# Patient Record
Sex: Female | Born: 1985
Health system: Southern US, Community
[De-identification: ages and names within clinical notes are randomized; demographics above are authoritative.]

## PROBLEM LIST (undated history)

## (undated) ENCOUNTER — Inpatient Hospital Stay (HOSPITAL_COMMUNITY): Payer: Self-pay

## (undated) ENCOUNTER — Inpatient Hospital Stay (HOSPITAL_COMMUNITY): Payer: BLUE CROSS/BLUE SHIELD

## (undated) DIAGNOSIS — O24419 Gestational diabetes mellitus in pregnancy, unspecified control: Secondary | ICD-10-CM

## (undated) DIAGNOSIS — Z5189 Encounter for other specified aftercare: Secondary | ICD-10-CM

## (undated) DIAGNOSIS — O00109 Unspecified tubal pregnancy without intrauterine pregnancy: Secondary | ICD-10-CM

## (undated) DIAGNOSIS — N189 Chronic kidney disease, unspecified: Secondary | ICD-10-CM

## (undated) DIAGNOSIS — Z8759 Personal history of other complications of pregnancy, childbirth and the puerperium: Secondary | ICD-10-CM

## (undated) DIAGNOSIS — N83209 Unspecified ovarian cyst, unspecified side: Secondary | ICD-10-CM

## (undated) HISTORY — PX: DILATION AND CURETTAGE OF UTERUS: SHX78

---
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
## 2004-01-09 ENCOUNTER — Emergency Department (HOSPITAL_COMMUNITY): Admission: EM | Admit: 2004-01-09 | Discharge: 2004-01-09 | Payer: Self-pay | Admitting: Family Medicine

## 2007-01-09 ENCOUNTER — Emergency Department (HOSPITAL_COMMUNITY): Admission: EM | Admit: 2007-01-09 | Discharge: 2007-01-10 | Payer: Self-pay | Admitting: Emergency Medicine

## 2007-08-19 ENCOUNTER — Emergency Department (HOSPITAL_COMMUNITY): Admission: EM | Admit: 2007-08-19 | Discharge: 2007-08-19 | Payer: Self-pay | Admitting: Emergency Medicine

## 2007-12-07 ENCOUNTER — Encounter: Admission: RE | Admit: 2007-12-07 | Discharge: 2007-12-07 | Payer: Self-pay | Admitting: Family Medicine

## 2008-02-22 ENCOUNTER — Emergency Department (HOSPITAL_COMMUNITY): Admission: EM | Admit: 2008-02-22 | Discharge: 2008-02-22 | Payer: Self-pay | Admitting: Family Medicine

## 2008-03-05 ENCOUNTER — Emergency Department (HOSPITAL_COMMUNITY): Admission: EM | Admit: 2008-03-05 | Discharge: 2008-03-05 | Payer: Self-pay | Admitting: Family Medicine

## 2008-06-14 ENCOUNTER — Emergency Department (HOSPITAL_COMMUNITY): Admission: EM | Admit: 2008-06-14 | Discharge: 2008-06-14 | Payer: Self-pay | Admitting: Emergency Medicine

## 2008-06-19 ENCOUNTER — Encounter: Admission: RE | Admit: 2008-06-19 | Discharge: 2008-06-19 | Payer: Self-pay | Admitting: Chiropractic Medicine

## 2009-03-12 ENCOUNTER — Emergency Department (HOSPITAL_COMMUNITY): Admission: EM | Admit: 2009-03-12 | Discharge: 2009-03-13 | Payer: Self-pay | Admitting: Emergency Medicine

## 2009-07-06 DIAGNOSIS — O24419 Gestational diabetes mellitus in pregnancy, unspecified control: Secondary | ICD-10-CM

## 2009-07-06 HISTORY — DX: Gestational diabetes mellitus in pregnancy, unspecified control: O24.419

## 2009-10-01 ENCOUNTER — Inpatient Hospital Stay (HOSPITAL_COMMUNITY): Admission: AD | Admit: 2009-10-01 | Discharge: 2009-10-01 | Payer: Self-pay | Admitting: Family Medicine

## 2009-10-03 ENCOUNTER — Inpatient Hospital Stay (HOSPITAL_COMMUNITY): Admission: AD | Admit: 2009-10-03 | Discharge: 2009-10-03 | Payer: Self-pay | Admitting: Family Medicine

## 2009-10-10 ENCOUNTER — Ambulatory Visit (HOSPITAL_COMMUNITY): Admission: RE | Admit: 2009-10-10 | Discharge: 2009-10-10 | Payer: Self-pay | Admitting: Family Medicine

## 2009-10-18 ENCOUNTER — Ambulatory Visit: Payer: Self-pay | Admitting: Physician Assistant

## 2009-10-18 ENCOUNTER — Inpatient Hospital Stay (HOSPITAL_COMMUNITY): Admission: AD | Admit: 2009-10-18 | Discharge: 2009-10-18 | Payer: Self-pay | Admitting: Obstetrics

## 2009-12-04 ENCOUNTER — Ambulatory Visit: Admission: RE | Admit: 2009-12-04 | Discharge: 2009-12-04 | Payer: Self-pay | Admitting: Gynecologic Oncology

## 2009-12-04 ENCOUNTER — Ambulatory Visit (HOSPITAL_COMMUNITY): Admission: RE | Admit: 2009-12-04 | Discharge: 2009-12-04 | Payer: Self-pay | Admitting: Gynecologic Oncology

## 2009-12-10 ENCOUNTER — Ambulatory Visit: Payer: Self-pay | Admitting: Obstetrics and Gynecology

## 2009-12-10 ENCOUNTER — Inpatient Hospital Stay (HOSPITAL_COMMUNITY): Admission: AD | Admit: 2009-12-10 | Discharge: 2009-12-10 | Payer: Self-pay | Admitting: Obstetrics

## 2010-01-08 ENCOUNTER — Ambulatory Visit (HOSPITAL_COMMUNITY): Admission: RE | Admit: 2010-01-08 | Discharge: 2010-01-08 | Payer: Self-pay | Admitting: Obstetrics

## 2010-02-07 ENCOUNTER — Inpatient Hospital Stay (HOSPITAL_COMMUNITY): Admission: AD | Admit: 2010-02-07 | Discharge: 2010-02-07 | Payer: Self-pay | Admitting: Obstetrics & Gynecology

## 2010-02-07 ENCOUNTER — Ambulatory Visit: Payer: Self-pay | Admitting: Obstetrics and Gynecology

## 2010-02-07 DIAGNOSIS — N390 Urinary tract infection, site not specified: Secondary | ICD-10-CM

## 2010-02-07 DIAGNOSIS — O2390 Unspecified genitourinary tract infection in pregnancy, unspecified trimester: Secondary | ICD-10-CM

## 2010-02-07 DIAGNOSIS — R109 Unspecified abdominal pain: Secondary | ICD-10-CM

## 2010-02-26 ENCOUNTER — Ambulatory Visit (HOSPITAL_COMMUNITY): Admission: RE | Admit: 2010-02-26 | Discharge: 2010-02-26 | Payer: Self-pay | Admitting: Obstetrics & Gynecology

## 2010-05-11 ENCOUNTER — Inpatient Hospital Stay (HOSPITAL_COMMUNITY): Admission: AD | Admit: 2010-05-11 | Discharge: 2010-05-11 | Payer: Self-pay | Admitting: Obstetrics & Gynecology

## 2010-05-22 ENCOUNTER — Observation Stay (HOSPITAL_COMMUNITY)
Admission: AD | Admit: 2010-05-22 | Discharge: 2010-05-23 | Payer: Self-pay | Source: Home / Self Care | Admitting: Obstetrics & Gynecology

## 2010-05-23 ENCOUNTER — Inpatient Hospital Stay (HOSPITAL_COMMUNITY)
Admission: AD | Admit: 2010-05-23 | Discharge: 2010-05-24 | Payer: Self-pay | Source: Home / Self Care | Admitting: Obstetrics & Gynecology

## 2010-05-25 ENCOUNTER — Inpatient Hospital Stay (HOSPITAL_COMMUNITY)
Admission: AD | Admit: 2010-05-25 | Discharge: 2010-05-28 | Payer: Self-pay | Source: Home / Self Care | Admitting: Obstetrics & Gynecology

## 2010-05-26 ENCOUNTER — Encounter: Payer: Self-pay | Admitting: Obstetrics & Gynecology

## 2010-07-27 ENCOUNTER — Encounter: Payer: Self-pay | Admitting: Family Medicine

## 2010-09-16 LAB — CBC
Hemoglobin: 12 g/dL (ref 12.0–15.0)
MCH: 28.7 pg (ref 26.0–34.0)
MCH: 28.8 pg (ref 26.0–34.0)
MCHC: 32.6 g/dL (ref 30.0–36.0)
MCHC: 32.8 g/dL (ref 30.0–36.0)
MCHC: 32.8 g/dL (ref 30.0–36.0)
MCV: 87.3 fL (ref 78.0–100.0)
RBC: 3.62 MIL/uL — ABNORMAL LOW (ref 3.87–5.11)
RBC: 4.22 MIL/uL (ref 3.87–5.11)
RDW: 14.9 % (ref 11.5–15.5)
RDW: 15 % (ref 11.5–15.5)
WBC: 5.3 10*3/uL (ref 4.0–10.5)
WBC: 6.5 10*3/uL (ref 4.0–10.5)

## 2010-09-16 LAB — GLUCOSE, RANDOM: Glucose, Bld: 79 mg/dL (ref 70–99)

## 2010-09-16 LAB — RPR: RPR Ser Ql: NONREACTIVE

## 2010-09-19 LAB — URINE MICROSCOPIC-ADD ON

## 2010-09-19 LAB — URINE CULTURE

## 2010-09-19 LAB — URINALYSIS, ROUTINE W REFLEX MICROSCOPIC
Glucose, UA: NEGATIVE mg/dL
Ketones, ur: NEGATIVE mg/dL
pH: 7.5 (ref 5.0–8.0)

## 2010-09-22 LAB — URINALYSIS, ROUTINE W REFLEX MICROSCOPIC
Bilirubin Urine: NEGATIVE
Hgb urine dipstick: NEGATIVE
Ketones, ur: NEGATIVE mg/dL
Protein, ur: NEGATIVE mg/dL
Specific Gravity, Urine: 1.025 (ref 1.005–1.030)
Urobilinogen, UA: 0.2 mg/dL (ref 0.0–1.0)

## 2010-09-28 LAB — WET PREP, GENITAL: Clue Cells Wet Prep HPF POC: NONE SEEN

## 2010-09-28 LAB — ABO/RH: ABO/RH(D): O POS

## 2010-09-28 LAB — CBC
Hemoglobin: 11.6 g/dL — ABNORMAL LOW (ref 12.0–15.0)
RDW: 14.1 % (ref 11.5–15.5)

## 2010-09-28 LAB — GC/CHLAMYDIA PROBE AMP, GENITAL: Chlamydia, DNA Probe: NEGATIVE

## 2010-09-28 LAB — HCG, QUANTITATIVE, PREGNANCY: hCG, Beta Chain, Quant, S: 545 m[IU]/mL — ABNORMAL HIGH (ref <5)

## 2010-10-10 LAB — DIFFERENTIAL
Basophils Absolute: 0 10*3/uL (ref 0.0–0.1)
Lymphocytes Relative: 52 % — ABNORMAL HIGH (ref 12–46)
Monocytes Absolute: 0.5 10*3/uL (ref 0.1–1.0)
Neutro Abs: 3.7 10*3/uL (ref 1.7–7.7)

## 2010-10-10 LAB — CBC
Hemoglobin: 12 g/dL (ref 12.0–15.0)
Platelets: 266 10*3/uL (ref 150–400)
RDW: 13.5 % (ref 11.5–15.5)

## 2010-10-10 LAB — POCT I-STAT, CHEM 8
BUN: 9 mg/dL (ref 6–23)
Chloride: 104 mEq/L (ref 96–112)
Potassium: 3.2 mEq/L — ABNORMAL LOW (ref 3.5–5.1)
Sodium: 139 mEq/L (ref 135–145)

## 2011-03-27 LAB — CARBOXYHEMOGLOBIN
Carboxyhemoglobin: 0.5
Methemoglobin: 0.8
O2 Saturation: 100
Total hemoglobin: 11.4 — ABNORMAL LOW

## 2011-04-21 LAB — DIFFERENTIAL
Basophils Absolute: 0
Eosinophils Relative: 0
Lymphocytes Relative: 25
Monocytes Absolute: 0.1 — ABNORMAL LOW
Monocytes Relative: 1 — ABNORMAL LOW

## 2011-04-21 LAB — COMPREHENSIVE METABOLIC PANEL
AST: 15
Albumin: 4
Alkaline Phosphatase: 44
Chloride: 106
GFR calc Af Amer: >60
Potassium: 3.4 — ABNORMAL LOW
Total Bilirubin: 0.7
Total Protein: 7.3

## 2011-04-21 LAB — CBC
Platelets: 316
WBC: 6

## 2011-12-22 ENCOUNTER — Other Ambulatory Visit: Payer: Self-pay | Admitting: Obstetrics & Gynecology

## 2011-12-22 DIAGNOSIS — N63 Unspecified lump in unspecified breast: Secondary | ICD-10-CM

## 2011-12-22 DIAGNOSIS — N644 Mastodynia: Secondary | ICD-10-CM

## 2011-12-24 ENCOUNTER — Ambulatory Visit
Admission: RE | Admit: 2011-12-24 | Discharge: 2011-12-24 | Disposition: A | Discharge status: Home / Self Care | Payer: No Typology Code available for payment source | Source: Ambulatory Visit | Attending: Obstetrics & Gynecology | Admitting: Obstetrics & Gynecology

## 2011-12-24 DIAGNOSIS — N63 Unspecified lump in unspecified breast: Secondary | ICD-10-CM

## 2011-12-24 DIAGNOSIS — N644 Mastodynia: Secondary | ICD-10-CM

## 2013-02-01 ENCOUNTER — Encounter (HOSPITAL_COMMUNITY): Payer: Self-pay

## 2013-02-01 ENCOUNTER — Inpatient Hospital Stay (HOSPITAL_COMMUNITY): Payer: BC Managed Care – PPO

## 2013-02-01 ENCOUNTER — Inpatient Hospital Stay (HOSPITAL_COMMUNITY)
Admission: AD | Admit: 2013-02-01 | Discharge: 2013-02-02 | Disposition: A | Discharge status: Home / Self Care | Payer: BC Managed Care – PPO | Source: Ambulatory Visit | Attending: Obstetrics & Gynecology | Admitting: Obstetrics & Gynecology

## 2013-02-01 DIAGNOSIS — R109 Unspecified abdominal pain: Secondary | ICD-10-CM | POA: Insufficient documentation

## 2013-02-01 DIAGNOSIS — Z3201 Encounter for pregnancy test, result positive: Secondary | ICD-10-CM

## 2013-02-01 DIAGNOSIS — O26899 Other specified pregnancy related conditions, unspecified trimester: Secondary | ICD-10-CM

## 2013-02-01 DIAGNOSIS — O99891 Other specified diseases and conditions complicating pregnancy: Secondary | ICD-10-CM | POA: Insufficient documentation

## 2013-02-01 HISTORY — DX: Unspecified ovarian cyst, unspecified side: N83.209

## 2013-02-01 HISTORY — DX: Gestational diabetes mellitus in pregnancy, unspecified control: O24.419

## 2013-02-01 LAB — CBC
HCT: 33.8 % — ABNORMAL LOW (ref 36.0–46.0)
Hemoglobin: 11.2 g/dL — ABNORMAL LOW (ref 12.0–15.0)
MCV: 84.5 fL (ref 78.0–100.0)
RBC: 4 MIL/uL (ref 3.87–5.11)
RDW: 13.3 % (ref 11.5–15.5)
WBC: 6.2 10*3/uL (ref 4.0–10.5)

## 2013-02-01 LAB — POCT PREGNANCY, URINE: Preg Test, Ur: POSITIVE — AB

## 2013-02-01 LAB — URINALYSIS, ROUTINE W REFLEX MICROSCOPIC
Bilirubin Urine: NEGATIVE
Glucose, UA: NEGATIVE mg/dL
Hgb urine dipstick: NEGATIVE
Ketones, ur: NEGATIVE mg/dL
Nitrite: NEGATIVE
Protein, ur: NEGATIVE mg/dL
Specific Gravity, Urine: >1.03 — ABNORMAL HIGH (ref 1.005–1.030)
Urobilinogen, UA: 0.2 mg/dL (ref 0.0–1.0)
pH: 6 (ref 5.0–8.0)

## 2013-02-01 LAB — WET PREP, GENITAL
Clue Cells Wet Prep HPF POC: NONE SEEN
Trich, Wet Prep: NONE SEEN
Yeast Wet Prep HPF POC: NONE SEEN

## 2013-02-01 LAB — HCG, QUANTITATIVE, PREGNANCY: hCG, Beta Chain, Quant, S: 2917 m[IU]/mL — ABNORMAL HIGH (ref <5)

## 2013-02-01 LAB — URINE MICROSCOPIC-ADD ON

## 2013-02-01 MED ORDER — GI COCKTAIL ~~LOC~~
30.0000 mL | Freq: Once | ORAL | Status: AC
  Administered 2013-02-01: 30 mL via ORAL
  Filled 2013-02-01: qty 30

## 2013-02-01 NOTE — MAU Note (Signed)
Pt states she noticed pain a week ago, that pt described as menstrual cramps. Pt took a pregnancy test after menses did not start

## 2013-02-01 NOTE — MAU Provider Note (Signed)
History     CSN: [id number]  Arrival date and time: 02/01/13 2021   None     Chief Complaint  Patient presents with  . Abdominal Cramping   HPI This is a 27 y.o. female at [redacted]w[redacted]d who presents with c/o (to RN) of cramping in lower abdomen since getting a positive pregnancy test at home. But when I ask her myself where the cramping is, she tells me that the cramping is umbilical and upper abdomen. None in lower abdomen. No bleeding.   RN Note: Patient is in with cramping, had a positive home pregnancy test. She denies vaginal bleeding. lmp 6/20      OB History   Grav Para Term Preterm Abortions TAB SAB Ect Mult Living   3 1 1  1  1   1       Past Medical History  Diagnosis Date  . Gestational diabetes   . Ovarian cyst     Past Surgical History  Procedure Laterality Date  . Cesarean section      No family history on file.  History  Substance Use Topics  . Smoking status: Never Smoker   . Smokeless tobacco: Not on file  . Alcohol Use: No    Allergies:  Allergies  Allergen Reactions  . Other     Pt reports allergy to any type of melon fruit.  . Vicodin (Hydrocodone-Acetaminophen) Other (See Comments)    Pt reports seizures    Prescriptions prior to admission  Medication Sig Dispense Refill  . ibuprofen (ADVIL,MOTRIN) 200 MG tablet Take 400 mg by mouth every 6 (six) hours as needed for pain.      . Multiple Vitamin (MULTIVITAMIN WITH MINERALS) TABS Take 1 tablet by mouth daily.        Review of Systems  Constitutional: Negative for fever, chills and malaise/fatigue.  Gastrointestinal: Positive for abdominal pain (cramping upper abdomen). Negative for nausea, vomiting, diarrhea and constipation.  Genitourinary: Negative for dysuria.  Neurological: Negative for dizziness.   Physical Exam   Height 5\' 4"  (1.626 m), weight 99.451 kg (219 lb 4 oz), last menstrual period 12/23/2012.  Physical Exam  Constitutional: She is oriented to person, place, and time.  She appears well-developed and well-nourished. No distress.  HENT:  Head: Normocephalic.  Cardiovascular: Normal rate.   Respiratory: Effort normal.  GI: Soft. She exhibits no distension. There is no tenderness. There is no rebound and no guarding.  Genitourinary: Vagina normal and uterus normal. No vaginal discharge found.  Musculoskeletal: Normal range of motion.  Neurological: She is alert and oriented to person, place, and time.  Skin: Skin is warm and dry.  Psychiatric: She has a normal mood and affect.    MAU Course  Procedures  MDM Results for orders placed during the hospital encounter of 02/01/13 (from the past 24 hour(s))  URINALYSIS, ROUTINE W REFLEX MICROSCOPIC     Status: Abnormal   Collection Time    02/01/13  8:30 PM      Result Value Range   Color, Urine YELLOW  YELLOW   APPearance CLOUDY (*) CLEAR   Specific Gravity, Urine >1.030 (*) 1.005 - 1.030   pH 6.0  5.0 - 8.0   Glucose, UA NEGATIVE  NEGATIVE mg/dL   Hgb urine dipstick NEGATIVE  NEGATIVE   Bilirubin Urine NEGATIVE  NEGATIVE   Ketones, ur NEGATIVE  NEGATIVE mg/dL   Protein, ur NEGATIVE  NEGATIVE mg/dL   Urobilinogen, UA 0.2  0.0 - 1.0 mg/dL   Nitrite  NEGATIVE  NEGATIVE   Leukocytes, UA MODERATE (*) NEGATIVE  URINE MICROSCOPIC-ADD ON     Status: Abnormal   Collection Time    02/01/13  8:30 PM      Result Value Range   Squamous Epithelial / LPF FEW (*) RARE   WBC, UA 7-10  <3 WBC/hpf   RBC / HPF 0-2  <3 RBC/hpf   Bacteria, UA FEW (*) RARE   Urine-Other MUCOUS PRESENT    POCT PREGNANCY, URINE     Status: Abnormal   Collection Time    02/01/13  8:39 PM      Result Value Range   Preg Test, Ur POSITIVE (*) NEGATIVE  HCG, QUANTITATIVE, PREGNANCY     Status: Abnormal   Collection Time    02/01/13  9:15 PM      Result Value Range   hCG, Beta Chain, Quant, S 2917 (*) <5 mIU/mL  CBC     Status: Abnormal   Collection Time    02/01/13  9:15 PM      Result Value Range   WBC 6.2  4.0 - 10.5 K/uL   RBC  4.00  3.87 - 5.11 MIL/uL   Hemoglobin 11.2 (*) 12.0 - 15.0 g/dL   HCT 16.1 (*) 09.6 - 04.5 %   MCV 84.5  78.0 - 100.0 fL   MCH 28.0  26.0 - 34.0 pg   MCHC 33.1  30.0 - 36.0 g/dL   RDW 40.9  81.1 - 91.4 %   Platelets 350  150 - 400 K/uL  WET PREP, GENITAL     Status: Abnormal   Collection Time    02/01/13  9:33 PM      Result Value Range   Yeast Wet Prep HPF POC NONE SEEN  NONE SEEN   Trich, Wet Prep NONE SEEN  NONE SEEN   Clue Cells Wet Prep HPF POC NONE SEEN  NONE SEEN   WBC, Wet Prep HPF POC MODERATE (*) NONE SEEN    US Ob Transvaginal  02/01/2013   *RADIOLOGY REPORT*  Clinical Data: Quantitative beta HCG of 2900.  Cramping.  Irregular cycles.  OBSTETRIC <14 WK [place] AND TRANSVAGINAL OB US  Technique:  Both transabdominal and transvaginal ultrasound examinations were performed for complete evaluation of the gestation as well as the maternal uterus, adnexal regions, and pelvic cul-de-sac.  Transvaginal technique was performed to assess early pregnancy.  Comparison:  None.  Intrauterine gestational sac:  None visualized. Yolk sac: None visualized. Embryo: None visualized. Cardiac Activity: None visualized. Heart Rate: Not applicable. bpm  Maternal uterus/adnexae: Small amount of free fluid.  The ovaries have a normal physiologic appearance.  IMPRESSION: There is no intrauterine pregnancy identified.  The differential considerations include intrauterine gestation too early to be visualized, spontaneous abortion or ectopic.  Consider follow-up ultrasound in 14 days and serial quantitative beta HCGs.   Original Report Authenticated By: Andreas Newport, M.D.    Assessment and Plan  A:  Pregnancy at [redacted]w[redacted]d       Location and status of pregnancy undetermined      Upper abdominal discomfort relieved by GI cocktail, suspect GERD  P:  Discharge home       Ectopic precautions       Repeat quant in 48 hrs and [place] in 1 week       Advised to use Zantac/tums at home  [facility] 02/01/2013, 9:04 PM

## 2013-02-01 NOTE — MAU Note (Signed)
Patient is in with cramping, had a positive home pregnancy test. She denies vaginal bleeding. lmp 6/20

## 2013-02-02 ENCOUNTER — Other Ambulatory Visit (HOSPITAL_COMMUNITY): Payer: Self-pay | Admitting: Advanced Practice Midwife

## 2013-02-02 DIAGNOSIS — Z3201 Encounter for pregnancy test, result positive: Secondary | ICD-10-CM

## 2013-02-02 LAB — GC/CHLAMYDIA PROBE AMP: GC Probe RNA: NEGATIVE

## 2013-02-03 HISTORY — PX: SALPINGECTOMY: SHX328

## 2013-02-03 LAB — URINE CULTURE

## 2013-02-04 ENCOUNTER — Inpatient Hospital Stay (HOSPITAL_COMMUNITY)
Admission: AD | Admit: 2013-02-04 | Discharge: 2013-02-04 | Disposition: A | Discharge status: Home / Self Care | Payer: BC Managed Care – PPO | Source: Ambulatory Visit | Attending: Obstetrics & Gynecology | Admitting: Obstetrics & Gynecology

## 2013-02-04 DIAGNOSIS — R109 Unspecified abdominal pain: Secondary | ICD-10-CM

## 2013-02-04 DIAGNOSIS — O26899 Other specified pregnancy related conditions, unspecified trimester: Secondary | ICD-10-CM

## 2013-02-04 DIAGNOSIS — O99891 Other specified diseases and conditions complicating pregnancy: Secondary | ICD-10-CM | POA: Insufficient documentation

## 2013-02-04 LAB — HCG, QUANTITATIVE, PREGNANCY: hCG, Beta Chain, Quant, S: 6387 m[IU]/mL — ABNORMAL HIGH (ref <5)

## 2013-02-04 NOTE — MAU Provider Note (Signed)
  History     CSN: [id number]  Arrival date and time: 02/04/13 7846   None     Chief Complaint  Patient presents with  . Follow-up   HPI This is a 27 y.o. female at [redacted]w[redacted]d who presents for followup HCG. Seen for abdominal pain (mostly upper) 2 days ago. States has NO pain today.   OB History   Grav Para Term Preterm Abortions TAB SAB Ect Mult Living   3 1 1  1  1   1       Past Medical History  Diagnosis Date  . Gestational diabetes   . Ovarian cyst     Past Surgical History  Procedure Laterality Date  . Cesarean section      No family history on file.  History  Substance Use Topics  . Smoking status: Never Smoker   . Smokeless tobacco: Not on file  . Alcohol Use: No    Allergies:  Allergies  Allergen Reactions  . Other     Pt reports allergy to any type of melon fruit.  . Vicodin (Hydrocodone-Acetaminophen) Other (See Comments)    Pt reports seizures    Prescriptions prior to admission  Medication Sig Dispense Refill  . Multiple Vitamin (MULTIVITAMIN WITH MINERALS) TABS Take 1 tablet by mouth daily.        Review of Systems  Constitutional: Negative for fever and malaise/fatigue.  Gastrointestinal: Negative for abdominal pain.   Physical Exam   Blood pressure 132/75, pulse 78, temperature 98.3 F (36.8 C), temperature source Oral, resp. rate 18, last menstrual period 12/23/2012.  Physical Exam  Constitutional: She is oriented to person, place, and time. She appears well-developed and well-nourished.  Cardiovascular: Normal rate.   Respiratory: Effort normal.  Musculoskeletal: Normal range of motion.  Neurological: She is alert and oriented to person, place, and time.  Skin: Skin is warm and dry.  Psychiatric: She has a normal mood and affect.    MAU Course  Procedures  MDM Results for orders placed during the hospital encounter of 02/04/13 (from the past 24 hour(s))  HCG, QUANTITATIVE, PREGNANCY     Status: Abnormal   Collection Time   02/04/13  8:24 AM      Result Value Range   hCG, Beta Chain, Quant, S 6387 (*) <5 mIU/mL   This represents doubling from 1917  Assessment and Plan  A:  Pregnancy at [redacted]w[redacted]d      Appropriately rising quants  P:  Plan repeat US in a week       Ectopic precautions  [facility] 02/04/2013, 9:12 AM

## 2013-02-04 NOTE — MAU Note (Signed)
Pt presents for repeat lab work. Denies any pain.

## 2013-02-06 ENCOUNTER — Other Ambulatory Visit (HOSPITAL_COMMUNITY): Payer: Self-pay | Admitting: Advanced Practice Midwife

## 2013-02-06 DIAGNOSIS — O26899 Other specified pregnancy related conditions, unspecified trimester: Secondary | ICD-10-CM

## 2013-02-08 ENCOUNTER — Inpatient Hospital Stay (HOSPITAL_COMMUNITY): Payer: BC Managed Care – PPO

## 2013-02-08 ENCOUNTER — Encounter (HOSPITAL_COMMUNITY): Payer: Self-pay | Admitting: *Deleted

## 2013-02-08 ENCOUNTER — Ambulatory Visit (HOSPITAL_COMMUNITY)
Admission: AD | Admit: 2013-02-08 | Discharge: 2013-02-09 | Disposition: A | Discharge status: Home / Self Care | Payer: BC Managed Care – PPO | Source: Ambulatory Visit | Attending: Obstetrics & Gynecology | Admitting: Obstetrics & Gynecology

## 2013-02-08 ENCOUNTER — Encounter (HOSPITAL_COMMUNITY): Payer: Self-pay | Admitting: Certified Registered"

## 2013-02-08 ENCOUNTER — Inpatient Hospital Stay (HOSPITAL_COMMUNITY): Payer: BC Managed Care – PPO | Admitting: Certified Registered"

## 2013-02-08 ENCOUNTER — Encounter (HOSPITAL_COMMUNITY)
Admission: AD | Disposition: A | Discharge status: Home / Self Care | Payer: Self-pay | Source: Ambulatory Visit | Attending: Obstetrics & Gynecology

## 2013-02-08 DIAGNOSIS — K661 Hemoperitoneum: Secondary | ICD-10-CM

## 2013-02-08 DIAGNOSIS — O00109 Unspecified tubal pregnancy without intrauterine pregnancy: Secondary | ICD-10-CM

## 2013-02-08 DIAGNOSIS — O009 Unspecified ectopic pregnancy without intrauterine pregnancy: Secondary | ICD-10-CM | POA: Clinically undetermined

## 2013-02-08 HISTORY — PX: LAPAROSCOPY: SHX197

## 2013-02-08 HISTORY — PX: BILATERAL SALPINGECTOMY: SHX5743

## 2013-02-08 LAB — URINALYSIS, ROUTINE W REFLEX MICROSCOPIC
Nitrite: NEGATIVE
Specific Gravity, Urine: 1.02 (ref 1.005–1.030)
pH: 7 (ref 5.0–8.0)

## 2013-02-08 LAB — CBC WITH DIFFERENTIAL/PLATELET
Basophils Absolute: 0 10*3/uL (ref 0.0–0.1)
HCT: 34.5 % — ABNORMAL LOW (ref 36.0–46.0)
Hemoglobin: 11.3 g/dL — ABNORMAL LOW (ref 12.0–15.0)
Lymphocytes Relative: 10 % — ABNORMAL LOW (ref 12–46)
Monocytes Absolute: 0.3 10*3/uL (ref 0.1–1.0)
Monocytes Relative: 3 % (ref 3–12)
Neutro Abs: 10.6 10*3/uL — ABNORMAL HIGH (ref 1.7–7.7)
Neutrophils Relative %: 88 % — ABNORMAL HIGH (ref 43–77)
RDW: 13.4 % (ref 11.5–15.5)
WBC: 12 10*3/uL — ABNORMAL HIGH (ref 4.0–10.5)

## 2013-02-08 LAB — HCG, QUANTITATIVE, PREGNANCY: hCG, Beta Chain, Quant, S: 13687 m[IU]/mL — ABNORMAL HIGH (ref <5)

## 2013-02-08 LAB — URINE MICROSCOPIC-ADD ON

## 2013-02-08 LAB — CREATININE, SERUM
Creatinine, Ser: 0.69 mg/dL (ref 0.50–1.10)
GFR calc non Af Amer: >90 mL/min (ref >90)

## 2013-02-08 LAB — BUN: BUN: 10 mg/dL (ref 6–23)

## 2013-02-08 SURGERY — LAPAROSCOPY OPERATIVE
Anesthesia: General | Site: Abdomen | Laterality: Right | Wound class: Clean Contaminated

## 2013-02-08 MED ORDER — ONDANSETRON HCL 4 MG/2ML IJ SOLN
INTRAMUSCULAR | Status: DC | PRN
  Administered 2013-02-08: 4 mg via INTRAVENOUS

## 2013-02-08 MED ORDER — SUCCINYLCHOLINE CHLORIDE 20 MG/ML IJ SOLN
INTRAMUSCULAR | Status: DC | PRN
  Administered 2013-02-08: 120 mg via INTRAVENOUS

## 2013-02-08 MED ORDER — LACTATED RINGERS IV SOLN
INTRAVENOUS | Status: DC
  Administered 2013-02-08 (×3): via INTRAVENOUS
  Administered 2013-02-08: 125 mL/h via INTRAVENOUS

## 2013-02-08 MED ORDER — PROPOFOL 10 MG/ML IV BOLUS
INTRAVENOUS | Status: DC | PRN
  Administered 2013-02-08: 150 mg via INTRAVENOUS

## 2013-02-08 MED ORDER — ONDANSETRON 8 MG PO TBDP
8.0000 mg | ORAL_TABLET | Freq: Once | ORAL | Status: DC
  Filled 2013-02-08: qty 1

## 2013-02-08 MED ORDER — HYDROMORPHONE HCL PF 1 MG/ML IJ SOLN
INTRAMUSCULAR | Status: DC | PRN
  Administered 2013-02-08: 1 mg via INTRAVENOUS

## 2013-02-08 MED ORDER — FENTANYL CITRATE 0.05 MG/ML IJ SOLN
INTRAMUSCULAR | Status: AC
  Administered 2013-02-08: 50 ug via INTRAVENOUS
  Filled 2013-02-08: qty 2

## 2013-02-08 MED ORDER — PROPOFOL 10 MG/ML IV EMUL
INTRAVENOUS | Status: AC
  Filled 2013-02-08: qty 20

## 2013-02-08 MED ORDER — GLYCOPYRROLATE 0.2 MG/ML IJ SOLN
INTRAMUSCULAR | Status: DC | PRN
  Administered 2013-02-08: .8 mg via INTRAVENOUS

## 2013-02-08 MED ORDER — ROCURONIUM BROMIDE 100 MG/10ML IV SOLN
INTRAVENOUS | Status: DC | PRN
  Administered 2013-02-08: 20 mg via INTRAVENOUS
  Administered 2013-02-08: 30 mg via INTRAVENOUS

## 2013-02-08 MED ORDER — NEOSTIGMINE METHYLSULFATE 1 MG/ML IJ SOLN
INTRAMUSCULAR | Status: DC | PRN
  Administered 2013-02-08: 5 mg via INTRAVENOUS

## 2013-02-08 MED ORDER — ROCURONIUM BROMIDE 50 MG/5ML IV SOLN
INTRAVENOUS | Status: AC
  Filled 2013-02-08: qty 1

## 2013-02-08 MED ORDER — PHENYLEPHRINE HCL 10 MG/ML IJ SOLN
10.0000 mg | INTRAVENOUS | Status: DC | PRN
  Administered 2013-02-08: 50 ug/min via INTRAVENOUS

## 2013-02-08 MED ORDER — LIDOCAINE HCL (CARDIAC) 20 MG/ML IV SOLN
INTRAVENOUS | Status: DC | PRN
  Administered 2013-02-08: 100 mg via INTRAVENOUS

## 2013-02-08 MED ORDER — ONDANSETRON HCL 4 MG/2ML IJ SOLN
INTRAMUSCULAR | Status: AC
  Filled 2013-02-08: qty 2

## 2013-02-08 MED ORDER — ONDANSETRON HCL 4 MG/2ML IJ SOLN
4.0000 mg | Freq: Four times a day (QID) | INTRAMUSCULAR | Status: DC | PRN
  Administered 2013-02-08: 4 mg via INTRAVENOUS

## 2013-02-08 MED ORDER — BUPIVACAINE HCL (PF) 0.25 % IJ SOLN
INTRAMUSCULAR | Status: DC | PRN
  Administered 2013-02-08: 6 mL
  Administered 2013-02-08: 4 mL

## 2013-02-08 MED ORDER — FENTANYL CITRATE 0.05 MG/ML IJ SOLN
INTRAMUSCULAR | Status: DC | PRN
  Administered 2013-02-08: 50 ug via INTRAVENOUS
  Administered 2013-02-08: 100 ug via INTRAVENOUS
  Administered 2013-02-08 (×2): 50 ug via INTRAVENOUS

## 2013-02-08 MED ORDER — NEOSTIGMINE METHYLSULFATE 1 MG/ML IJ SOLN
INTRAMUSCULAR | Status: AC
  Filled 2013-02-08: qty 1

## 2013-02-08 MED ORDER — CITRIC ACID-SODIUM CITRATE 334-500 MG/5ML PO SOLN
30.0000 mL | Freq: Once | ORAL | Status: AC
  Administered 2013-02-08: 30 mL via ORAL
  Filled 2013-02-08: qty 15

## 2013-02-08 MED ORDER — HYDROMORPHONE HCL PF 1 MG/ML IJ SOLN
INTRAMUSCULAR | Status: AC
  Filled 2013-02-08: qty 1

## 2013-02-08 MED ORDER — ACETAMINOPHEN 500 MG PO TABS
1000.0000 mg | ORAL_TABLET | Freq: Four times a day (QID) | ORAL | Status: DC
  Administered 2013-02-08 – 2013-02-09 (×3): 1000 mg via ORAL
  Filled 2013-02-08 (×3): qty 2

## 2013-02-08 MED ORDER — GLYCOPYRROLATE 0.2 MG/ML IJ SOLN
INTRAMUSCULAR | Status: AC
  Filled 2013-02-08: qty 4

## 2013-02-08 MED ORDER — ONDANSETRON HCL 4 MG PO TABS: 4.0000 mg | ORAL_TABLET | Freq: Four times a day (QID) | ORAL | Status: DC | PRN

## 2013-02-08 MED ORDER — FENTANYL CITRATE 0.05 MG/ML IJ SOLN
25.0000 ug | INTRAMUSCULAR | Status: AC | PRN
  Administered 2013-02-08: 25 ug via INTRAVENOUS
  Administered 2013-02-08 (×2): 50 ug via INTRAVENOUS

## 2013-02-08 MED ORDER — HYDROMORPHONE HCL PF 1 MG/ML IJ SOLN
0.5000 mg | INTRAMUSCULAR | Status: DC | PRN
Start: 2013-02-08 — End: 2013-02-09
  Administered 2013-02-09 (×3): 0.5 mg via INTRAVENOUS
  Filled 2013-02-08 (×3): qty 1

## 2013-02-08 MED ORDER — FENTANYL CITRATE 0.05 MG/ML IJ SOLN
INTRAMUSCULAR | Status: AC
  Filled 2013-02-08: qty 5

## 2013-02-08 MED ORDER — MIDAZOLAM HCL 2 MG/2ML IJ SOLN
INTRAMUSCULAR | Status: AC
  Filled 2013-02-08: qty 2

## 2013-02-08 MED ORDER — MIDAZOLAM HCL 5 MG/5ML IJ SOLN
INTRAMUSCULAR | Status: DC | PRN
  Administered 2013-02-08: 1 mg via INTRAVENOUS

## 2013-02-08 MED ORDER — ONDANSETRON 8 MG/NS 50 ML IVPB: 8.0000 mg | Freq: Once | INTRAVENOUS | Status: DC

## 2013-02-08 MED ORDER — LACTATED RINGERS IV BOLUS (SEPSIS): 1000.0000 mL | Freq: Once | INTRAVENOUS | Status: DC

## 2013-02-08 MED ORDER — BUPIVACAINE HCL (PF) 0.25 % IJ SOLN
INTRAMUSCULAR | Status: AC
  Filled 2013-02-08: qty 30

## 2013-02-08 MED ORDER — DEXTROSE-NACL 5-0.45 % IV SOLN
INTRAVENOUS | Status: DC
  Administered 2013-02-09: 02:00:00 via INTRAVENOUS

## 2013-02-08 MED ORDER — ZOLPIDEM TARTRATE 5 MG PO TABS: 5.0000 mg | ORAL_TABLET | Freq: Every evening | ORAL | Status: DC | PRN

## 2013-02-08 MED ORDER — PHENYLEPHRINE 40 MCG/ML (10ML) SYRINGE FOR IV PUSH (FOR BLOOD PRESSURE SUPPORT)
PREFILLED_SYRINGE | INTRAVENOUS | Status: AC
  Filled 2013-02-08: qty 5

## 2013-02-08 MED ORDER — PHENYLEPHRINE HCL 10 MG/ML IJ SOLN
INTRAMUSCULAR | Status: DC | PRN
  Administered 2013-02-08: 40 ug via INTRAVENOUS
  Administered 2013-02-08 (×8): 80 ug via INTRAVENOUS
  Administered 2013-02-08: 40 ug via INTRAVENOUS
  Administered 2013-02-08: 80 ug via INTRAVENOUS

## 2013-02-08 MED ORDER — PHENYLEPHRINE 40 MCG/ML (10ML) SYRINGE FOR IV PUSH (FOR BLOOD PRESSURE SUPPORT)
PREFILLED_SYRINGE | INTRAVENOUS | Status: AC
  Filled 2013-02-08: qty 20

## 2013-02-08 MED ORDER — LIDOCAINE HCL (CARDIAC) 20 MG/ML IV SOLN
INTRAVENOUS | Status: AC
  Filled 2013-02-08: qty 5

## 2013-02-08 MED ORDER — FENTANYL CITRATE 0.05 MG/ML IJ SOLN
INTRAMUSCULAR | Status: AC
  Administered 2013-02-08: 25 ug via INTRAVENOUS
  Filled 2013-02-08: qty 2

## 2013-02-08 MED ORDER — PHENYLEPHRINE HCL 10 MG/ML IJ SOLN
INTRAMUSCULAR | Status: AC
  Filled 2013-02-08: qty 1

## 2013-02-08 MED ORDER — DEXAMETHASONE SODIUM PHOSPHATE 10 MG/ML IJ SOLN
INTRAMUSCULAR | Status: DC | PRN
  Administered 2013-02-08: 10 mg via INTRAVENOUS

## 2013-02-08 MED ORDER — TRAMADOL HCL 50 MG PO TABS
100.0000 mg | ORAL_TABLET | Freq: Four times a day (QID) | ORAL | Status: DC | PRN
  Administered 2013-02-09 (×2): 100 mg via ORAL
  Filled 2013-02-08 (×2): qty 2

## 2013-02-08 MED ORDER — FAMOTIDINE IN NACL 20-0.9 MG/50ML-% IV SOLN
20.0000 mg | Freq: Once | INTRAVENOUS | Status: AC
  Administered 2013-02-08: 20 mg via INTRAVENOUS
  Filled 2013-02-08: qty 50

## 2013-02-08 SURGICAL SUPPLY — 25 items
CABLE HIGH FREQUENCY MONO STRZ (ELECTRODE) IMPLANT
CATH ROBINSON RED A/P 16FR (CATHETERS) IMPLANT
CHLORAPREP W/TINT 26ML (MISCELLANEOUS) ×3 IMPLANT
CLOTH BEACON ORANGE TIMEOUT ST (SAFETY) ×3 IMPLANT
CONT SPECI 4OZ STER CLIK (MISCELLANEOUS) IMPLANT
DERMABOND ADVANCED (GAUZE/BANDAGES/DRESSINGS) ×1
DERMABOND ADVANCED .7 DNX12 (GAUZE/BANDAGES/DRESSINGS) ×2 IMPLANT
GLOVE BIO SURGEON STRL SZ 6.5 (GLOVE) ×3 IMPLANT
GOWN PREVENTION PLUS LG XLONG (DISPOSABLE) ×6 IMPLANT
NS IRRIG 1000ML POUR BTL (IV SOLUTION) ×3 IMPLANT
PACK LAPAROSCOPY BASIN (CUSTOM PROCEDURE TRAY) ×3 IMPLANT
POUCH SPECIMEN RETRIEVAL 10MM (ENDOMECHANICALS) ×6 IMPLANT
PROTECTOR NERVE ULNAR (MISCELLANEOUS) ×3 IMPLANT
SCRUB PCMX 4 OZ (MISCELLANEOUS) ×3 IMPLANT
SEALER TISSUE G2 CVD JAW 35 (ENDOMECHANICALS) ×2 IMPLANT
SEALER TISSUE G2 CVD JAW 45CM (ENDOMECHANICALS) ×1 IMPLANT
SET IRRIG TUBING LAPAROSCOPIC (IRRIGATION / IRRIGATOR) ×3 IMPLANT
SUT MNCRL AB 4-0 PS2 18 (SUTURE) IMPLANT
SUT VICRYL 0 UR6 27IN ABS (SUTURE) ×3 IMPLANT
SUT VICRYL 4-0 PS2 18IN ABS (SUTURE) IMPLANT
TOWEL OR 17X24 6PK STRL BLUE (TOWEL DISPOSABLE) ×6 IMPLANT
TRAY FOLEY CATH 14FR (SET/KITS/TRAYS/PACK) ×3 IMPLANT
TROCAR XCEL NON-BLD 11X100MML (ENDOMECHANICALS) ×3 IMPLANT
TROCAR XCEL NON-BLD 5MMX100MML (ENDOMECHANICALS) ×6 IMPLANT
WATER STERILE IRR 1000ML POUR (IV SOLUTION) IMPLANT

## 2013-02-08 NOTE — Preoperative (Signed)
Beta Blockers   Reason not to administer Beta Blockers:Not Applicable 

## 2013-02-08 NOTE — Anesthesia Postprocedure Evaluation (Signed)
Anesthesia Post Note  Patient: Nicole Landry  Procedure(s) Performed: Procedure(s) (LRB): LAPAROSCOPY OPERATIVE (N/A)  SALPINGECTOMY (Right)  Anesthesia type: General  Patient location: PACU  Post pain: Pain level controlled  Post assessment: Post-op Vital signs reviewed  Last Vitals:  Filed Vitals:   02/08/13 2015  BP: 102/54  Pulse: 90  Temp:   Resp: 16    Post vital signs: Reviewed  Level of consciousness: sedated  Complications: No apparent anesthesia complications

## 2013-02-08 NOTE — H&P (Signed)
Nicole Landry is an 27 y.o. female. She presents with abdominal pain.  She has presented to the MAU on two prior occasions.  The bHCG has been rising appropriately.  An initial U/S failed to show an IUP or any suspicious adnexal mass.  Pertinent Gynecological History:  Sexually transmitted diseases: no past history Previous GYN Procedures: none     Menstrual History:  Patient's last menstrual period was 12/23/2012.    Past Medical History  Diagnosis Date  . Gestational diabetes   . Ovarian cyst     Past Surgical History  Procedure Laterality Date  . Cesarean section      History reviewed. No pertinent family history.  Social History:  reports that she has never smoked. She does not have any smokeless tobacco history on file. She reports that she does not drink alcohol or use illicit drugs.  Allergies:  Allergies  Allergen Reactions  . Other     Pt reports allergy to any type of melon fruit.  . Vicodin (Hydrocodone-Acetaminophen) Other (See Comments)    Pt reports seizures    Prescriptions prior to admission  Medication Sig Dispense Refill  . Prenatal Vit-Fe Fumarate-FA (PRENATAL MULTIVITAMIN) TABS tablet Take 1 tablet by mouth daily at 12 noon.        Review of Systems  Constitutional: Negative for fever.  Eyes: Negative for blurred vision.  Respiratory: Negative for shortness of breath.   Gastrointestinal: Positive for nausea and abdominal pain. Negative for vomiting.  Skin: Negative for rash.  Neurological: Negative for headaches.    Blood pressure 108/51, pulse 93, temperature 98.4 F (36.9 C), temperature source Oral, resp. rate 18, height 5\' 1"  (1.549 m), weight 216 lb (97.977 kg), last menstrual period 12/23/2012. Physical Exam  Constitutional: She appears well-developed.  HENT:  Head: Normocephalic.  Neck: Neck supple. No thyromegaly present.  Cardiovascular: Normal rate and regular rhythm.   Respiratory: Breath sounds normal.  GI: Soft. Bowel  sounds are normal.  Skin: No rash noted.    Results for orders placed during the hospital encounter of 02/08/13 (from the past 24 hour(s))  URINALYSIS, ROUTINE W REFLEX MICROSCOPIC     Status: Abnormal   Collection Time    02/08/13  1:10 PM      Result Value Range   Color, Urine YELLOW  YELLOW   APPearance HAZY (*) CLEAR   Specific Gravity, Urine 1.020  1.005 - 1.030   pH 7.0  5.0 - 8.0   Glucose, UA NEGATIVE  NEGATIVE mg/dL   Hgb urine dipstick NEGATIVE  NEGATIVE   Bilirubin Urine NEGATIVE  NEGATIVE   Ketones, ur NEGATIVE  NEGATIVE mg/dL   Protein, ur NEGATIVE  NEGATIVE mg/dL   Urobilinogen, UA 0.2  0.0 - 1.0 mg/dL   Nitrite NEGATIVE  NEGATIVE   Leukocytes, UA SMALL (*) NEGATIVE  URINE MICROSCOPIC-ADD ON     Status: Abnormal   Collection Time    02/08/13  1:10 PM      Result Value Range   Squamous Epithelial / LPF MANY (*) RARE   WBC, UA 7-10  <3 WBC/hpf   Bacteria, UA MANY (*) RARE  CBC WITH DIFFERENTIAL     Status: Abnormal   Collection Time    02/08/13  2:51 PM      Result Value Range   WBC 12.0 (*) 4.0 - 10.5 K/uL   RBC 4.09  3.87 - 5.11 MIL/uL   Hemoglobin 11.3 (*) 12.0 - 15.0 g/dL   HCT 30.8 (*) 65.7 -  46.0 %   MCV 84.4  78.0 - 100.0 fL   MCH 27.6  26.0 - 34.0 pg   MCHC 32.8  30.0 - 36.0 g/dL   RDW 41.3  24.4 - 01.0 %   Platelets 363  150 - 400 K/uL   Neutrophils Relative % 88 (*) 43 - 77 %   Neutro Abs 10.6 (*) 1.7 - 7.7 K/uL   Lymphocytes Relative 10 (*) 12 - 46 %   Lymphs Abs 1.1  0.7 - 4.0 K/uL   Monocytes Relative 3  3 - 12 %   Monocytes Absolute 0.3  0.1 - 1.0 K/uL   Eosinophils Relative 0  0 - 5 %   Eosinophils Absolute 0.0  0.0 - 0.7 K/uL   Basophils Relative 0  0 - 1 %   Basophils Absolute 0.0  0.0 - 0.1 K/uL  AST     Status: None   Collection Time    02/08/13  2:51 PM      Result Value Range   AST 12  0 - 37 U/L  BUN     Status: None   Collection Time    02/08/13  2:51 PM      Result Value Range   BUN 10  6 - 23 mg/dL  CREATININE, SERUM      Status: None   Collection Time    02/08/13  2:51 PM      Result Value Range   Creatinine, Ser 0.69  0.50 - 1.10 mg/dL   GFR calc non Af Amer >90  >90 mL/min   GFR calc Af Amer >90  >90 mL/min  HCG, QUANTITATIVE, PREGNANCY     Status: Abnormal   Collection Time    02/08/13  3:00 PM      Result Value Range   hCG, Beta Chain, Quant, [place] [address] (*) <5 mIU/mL      Assessment/Plan: Tubal ectopic pregnancy  -->laparoscopic salpingectomy; informed consent obtained  JACKSON-MOORE,Nicole Landry 02/08/2013, 4:56 PM

## 2013-02-08 NOTE — MAU Note (Signed)
Really bad morning sickness, can't keep anything down, lasts all day.  Has been constipated, no BM in a wk, started having cramping today- finally had a small bm.  Feel weak and tired.  Never was this sick with first preg, especially since she is working.

## 2013-02-08 NOTE — Op Note (Signed)
Procedure Note  Nicole Landry 26 y.o. 02/08/2013  Preoperative Diagnosis: Right tubal ectopic pregnancy, rule out rupture   Postoperative Diagnosis: Right tubal ectopic pregnancy, ruptured  Procedure: Laparoscopic right salpingectomy Surgeons: Quinn Plowman, CHARLES A  Indications:  The patient now presents for a laparoscopic salpingectomy after discussing therapeutic alternatives.        Procedure Detail:  The patient was taken to the operating room and was placed on the operating table in the dorsal supine position.  After his satisfactory general anesthesia was achieved, the patient was placed in the semi-lithotomy position using Allen stirrups. The patient was prepped and draped in the usual sterile manner for vaginal laparoscopic procedure. A speculum was placed in the vagina. The anterior lip of the cervix was grasped with a single-tooth tenaculum. A Hulka manipulator was then advanced into the uterus and secured  to the anterior lip of the cervix as a means to manipulate the uterus. The single-tooth tenaculum and Hulka manipulator were then removed. The infraumbilical region was then anesthetized with local anesthesia, 0.25%  Marcaine. A small incision was made to the skin and subcutaneous tissue. A 5 mm Optiview trocar was placed through the incision into the abdominal cavity diagnostic laparoscope with video camera attached was placed through the trocar sleeve and carbon dioxide was used to insufflate the abdominal and pelvic cavity. Bilateral lower quadrant 5 mm ports and a 10 mm suprapubic port were all placed under direct visualization.  The pelvic contents were examined and the findings were described below. The right fallopian tube was grasped.  The mesosalpinx and proximal fallopian tube were coagulated and divided with the Enseal device.  Bleeding from the proximal tube stump was coagulated with Kleppinger bipolar forceps. The tube with the products of conception and  clots were placed into endocatch bags and removed from the abdomen.  Attempts were made to evacuated the blood and clots with the suction irrigator.  The ports were then removed from the abdomen. The scope was removed and the excess carbon dioxide was remove through the port, before it was removed.  The subcutaneous layer of the suprapubic incision was reapproximated with an interrupted figure-of eight 0-Vicryl suture on a UR 6 needle. Dermabond was applied to the skins. The instruments were removed from the vagina and there was minimal bleeding from the cervix. Final sponge, instrument and needle counts were correct. The patient was awakened on the operating table and taken to the PACU in satisfactory condition.    Findings: Large hemoperitoneum--approximately 1 L.  Ruptured right tubal ectopic, isthmic portion of the tube.  Normal ovaries, uterus, left tube.  Estimated Blood Loss:  less than 50 mL              Total IV Fluids: per Anesthesiology        Condition: stable

## 2013-02-08 NOTE — Transfer of Care (Signed)
Immediate Anesthesia Transfer of Care Note  Patient: Nicole Landry  Procedure(s) Performed: Procedure(s): LAPAROSCOPY OPERATIVE (N/A)  SALPINGECTOMY (Right)  Patient Location: PACU  Anesthesia Type:General  Level of Consciousness: awake, alert , oriented and patient cooperative  Airway & Oxygen Therapy: Patient Spontanous Breathing and Patient connected to nasal cannula oxygen  Post-op Assessment: Report given to PACU RN, Post -op Vital signs reviewed and stable and Patient moving all extremities X 4  Post vital signs: Reviewed and stable  Complications: No apparent anesthesia complications

## 2013-02-08 NOTE — Anesthesia Procedure Notes (Signed)
Procedure Name: Intubation Date/Time: 02/08/2013 6:15 PM Performed by: Rossie Muskrat L Pre-anesthesia Checklist: Patient identified, Patient being monitored, Emergency Drugs available, Timeout performed and Suction available Patient Re-evaluated:Patient Re-evaluated prior to inductionOxygen Delivery Method: Circle system utilized Preoxygenation: Pre-oxygenation with 100% oxygen Intubation Type: IV induction, Rapid sequence and Cricoid Pressure applied Grade View: Grade I Tube type: Oral Tube size: 7.0 mm Number of attempts: 1 Airway Equipment and Method: Video-laryngoscopy and Stylet Placement Confirmation: ETT inserted through vocal cords under direct vision,  positive ETCO2 and breath sounds checked- equal and bilateral Secured at: 22 cm Tube secured with: Tape Dental Injury: Teeth and Oropharynx as per pre-operative assessment

## 2013-02-08 NOTE — Anesthesia Preprocedure Evaluation (Addendum)
Anesthesia Evaluation  Patient identified by MRN, date of birth, ID band Patient awake    Reviewed: Allergy & Precautions, H&P , Patient's Chart, lab work & pertinent test results, reviewed documented beta blocker date and time   Airway Mallampati: III TM Distance: >3 FB Neck ROM: full    Dental no notable dental hx.    Pulmonary  breath sounds clear to auscultation  Pulmonary exam normal       Cardiovascular Rhythm:regular Rate:Normal     Neuro/Psych    GI/Hepatic   Endo/Other  diabetes, Type obesity  Renal/GU      Musculoskeletal   Abdominal   Peds  Hematology   Anesthesia Other Findings   Reproductive/Obstetrics                           Anesthesia Physical Anesthesia Plan  ASA: III and emergent  Anesthesia Plan: General   Post-op Pain Management:    Induction: Intravenous and Rapid sequence  Airway Management Planned: Oral ETT and Video Laryngoscope Planned  Additional Equipment:   Intra-op Plan:   Post-operative Plan: Extubation in OR  Informed Consent: I have reviewed the patients History and Physical, chart, labs and discussed the procedure including the risks, benefits and alternatives for the proposed anesthesia with the patient or authorized representative who has indicated his/her understanding and acceptance.   Dental Advisory Given and Dental advisory given  Plan Discussed with: CRNA and Surgeon  Anesthesia Plan Comments: (  Discussed general anesthesia, including possible nausea, instrumentation of airway, sore throat,pulmonary aspiration, etc. I asked if the were any outstanding questions, or  concerns before we proceeded. )        Anesthesia Quick Evaluation

## 2013-02-08 NOTE — MAU Provider Note (Signed)
History     CSN: [id number]  Arrival date and time: 02/08/13 1252   First Provider Initiated Contact with Patient 02/08/13 1340      Chief Complaint  Patient presents with  . Morning Sickness  . Constipation  . Abdominal Pain   Constipation Associated symptoms include abdominal pain.  Abdominal Pain Associated symptoms include constipation.    Nicole Landry is a 27 y.o. G3P1011 at [redacted]w[redacted]d who presents today with nausea, and cramping. She denies any vomiting. She states that she has also been constipated. She denies any vaginal bleeding. She has not tried anything for the nausea as she does not have any prescriptions.  She has had one previous miscarriage and on full term delivery. She has been followed for HCG levels 2/2 no IUP initially seen. She states that she last ate at 0900 today.   Past Medical History  Diagnosis Date  . Gestational diabetes   . Ovarian cyst     Past Surgical History  Procedure Laterality Date  . Cesarean section      History reviewed. No pertinent family history.  History  Substance Use Topics  . Smoking status: Never Smoker   . Smokeless tobacco: Not on file  . Alcohol Use: No    Allergies:  Allergies  Allergen Reactions  . Other     Pt reports allergy to any type of melon fruit.  . Vicodin (Hydrocodone-Acetaminophen) Other (See Comments)    Pt reports seizures    Prescriptions prior to admission  Medication Sig Dispense Refill  . Prenatal Vit-Fe Fumarate-FA (PRENATAL MULTIVITAMIN) TABS tablet Take 1 tablet by mouth daily at 12 noon.        Review of Systems  Gastrointestinal: Positive for abdominal pain and constipation.   Physical Exam   Blood pressure 126/67, pulse 87, temperature 98.4 F (36.9 C), temperature source Oral, resp. rate 18, height 5\' 1"  (1.549 m), weight 97.977 kg (216 lb), last menstrual period 12/23/2012.  Physical Exam  Nursing note and vitals reviewed. Constitutional: She is oriented to person, place,  and time. She appears well-developed and well-nourished. No distress.  Cardiovascular: Normal rate.   Respiratory: Effort normal.  GI: Soft.  Neurological: She is alert and oriented to person, place, and time.  Skin: Skin is warm and dry.  Psychiatric: She has a normal mood and affect.    MAU Course  Procedures  Results for orders placed during the hospital encounter of 02/08/13 (from the past 24 hour(s))  URINALYSIS, ROUTINE W REFLEX MICROSCOPIC     Status: Abnormal   Collection Time    02/08/13  1:10 PM      Result Value Range   Color, Urine YELLOW  YELLOW   APPearance HAZY (*) CLEAR   Specific Gravity, Urine 1.020  1.005 - 1.030   pH 7.0  5.0 - 8.0   Glucose, UA NEGATIVE  NEGATIVE mg/dL   Hgb urine dipstick NEGATIVE  NEGATIVE   Bilirubin Urine NEGATIVE  NEGATIVE   Ketones, ur NEGATIVE  NEGATIVE mg/dL   Protein, ur NEGATIVE  NEGATIVE mg/dL   Urobilinogen, UA 0.2  0.0 - 1.0 mg/dL   Nitrite NEGATIVE  NEGATIVE   Leukocytes, UA SMALL (*) NEGATIVE  URINE MICROSCOPIC-ADD ON     Status: Abnormal   Collection Time    02/08/13  1:10 PM      Result Value Range   Squamous Epithelial / LPF MANY (*) RARE   WBC, UA 7-10  <3 WBC/hpf   Bacteria, UA MANY (*)  RARE     1450: C/W Dr. Tamela Oddi. Plan to keep the patient NPO at this time and she will come evaluate the patient.  Assessment and Plan  Ectopic pregnancy To OR with Dr. Donney Dice, Franco Collet 02/08/2013, 1:46 PM

## 2013-02-08 NOTE — Progress Notes (Signed)
Explained orthostatic VS's. Pt. Refuses to lie down. Patient states she feels very bad. C/O pain and nausea, feels hot.

## 2013-02-09 ENCOUNTER — Inpatient Hospital Stay (HOSPITAL_COMMUNITY)
Admission: AD | Admit: 2013-02-09 | Discharge: 2013-02-09 | Disposition: A | Discharge status: Home / Self Care | Payer: BC Managed Care – PPO | Source: Ambulatory Visit | Attending: Obstetrics | Admitting: Obstetrics

## 2013-02-09 ENCOUNTER — Encounter (HOSPITAL_COMMUNITY): Payer: Self-pay | Admitting: Obstetrics & Gynecology

## 2013-02-09 ENCOUNTER — Encounter: Payer: Self-pay | Admitting: Obstetrics & Gynecology

## 2013-02-09 DIAGNOSIS — T819XXA Unspecified complication of procedure, initial encounter: Secondary | ICD-10-CM

## 2013-02-09 DIAGNOSIS — IMO0002 Reserved for concepts with insufficient information to code with codable children: Secondary | ICD-10-CM | POA: Insufficient documentation

## 2013-02-09 LAB — BASIC METABOLIC PANEL
CO2: 19 mEq/L (ref 19–32)
Calcium: 8.9 mg/dL (ref 8.4–10.5)
Creatinine, Ser: 0.7 mg/dL (ref 0.50–1.10)
Glucose, Bld: 161 mg/dL — ABNORMAL HIGH (ref 70–99)

## 2013-02-09 LAB — CBC
MCH: 27.8 pg (ref 26.0–34.0)
MCV: 84.1 fL (ref 78.0–100.0)
Platelets: 345 10*3/uL (ref 150–400)
RBC: 2.77 MIL/uL — ABNORMAL LOW (ref 3.87–5.11)

## 2013-02-09 MED ORDER — IBUPROFEN 600 MG PO TABS
600.0000 mg | ORAL_TABLET | Freq: Four times a day (QID) | ORAL | Status: DC
  Administered 2013-02-09 (×2): 600 mg via ORAL
  Filled 2013-02-09 (×2): qty 1

## 2013-02-09 MED ORDER — FERROUS SULFATE 325 (65 FE) MG PO TABS: 325.0000 mg | ORAL_TABLET | Freq: Two times a day (BID) | ORAL | Status: DC

## 2013-02-09 MED ORDER — POLYETHYLENE GLYCOL 3350 17 G PO PACK: 17.0000 g | PACK | Freq: Every day | ORAL | Status: DC

## 2013-02-09 MED ORDER — OXYCODONE-ACETAMINOPHEN 5-325 MG PO TABS: 2.0000 | ORAL_TABLET | Freq: Four times a day (QID) | ORAL | Status: DC | PRN

## 2013-02-09 NOTE — Anesthesia Postprocedure Evaluation (Signed)
  Anesthesia Post-op Note  Patient: Nicole Landry  Procedure(s) Performed: Procedure(s): LAPAROSCOPY OPERATIVE (N/A)  SALPINGECTOMY (Right)  Patient Location: [facility]  Anesthesia Type:General  Level of Consciousness: awake, alert , oriented and patient cooperative  Airway and Oxygen Therapy: Patient Spontanous Breathing  Post-op Pain: mild  Post-op Assessment: Patient's Cardiovascular Status Stable, Respiratory Function Stable and No signs of Nausea or vomiting  Post-op Vital Signs: stable  Complications: No apparent anesthesia complications

## 2013-02-09 NOTE — Plan of Care (Signed)
Problem: Discharge Progression Outcomes Goal: Activity appropriate for discharge plan Outcome: Progressing Pt ambulated at discharge, needed a wheelchair by the time she got to the 1st floor.  She was accompanied by NT Seward Speck. Who helped her get into her car.

## 2013-02-09 NOTE — MAU Provider Note (Signed)
Chief Complaint: Wound Check   First Provider Initiated Contact with Patient 02/09/13 2051     SUBJECTIVE HPI: Nicole Landry is a 27 y.o. G3P1011 at 1 day S/P right salpingectomy for ruptured ectopic who presents with opening of her incision. Moderate incisional pain. Has not filled Rx's for pain meds. Denies fever, chills, nausea, vomiting, diarrhea, constipation. Past Medical History  Diagnosis Date  . Gestational diabetes   . Ovarian cyst    OB History   Grav Para Term Preterm Abortions TAB SAB Ect Mult Living   3 1 1  1  1   1      # Outc Date GA Lbr Len/2nd Wgt Sex Del Anes PTL Lv   1 TRM            2 SAB            3 CUR              Past Surgical History  Procedure Laterality Date  . Cesarean section    . Laparoscopy N/A 02/08/2013    Procedure: LAPAROSCOPY OPERATIVE;  Surgeon: Antionette Char, MD;  Location: [facility];  Service: Gynecology;  Laterality: N/A;  . Bilateral salpingectomy Right 02/08/2013    Procedure:  SALPINGECTOMY;  Surgeon: Antionette Char, MD;  Location: [facility];  Service: Gynecology;  Laterality: Right;   History   Social History  . Marital Status: Married    Spouse Name: N/A    Number of Children: N/A  . Years of Education: N/A   Occupational History  . Not on file.   Social History Main Topics  . Smoking status: Never Smoker   . Smokeless tobacco: Not on file  . Alcohol Use: No  . Drug Use: No  . Sexually Active: Yes    Birth Control/ Protection: None   Other Topics Concern  . Not on file   Social History Narrative  . No narrative on file   No current facility-administered medications on file prior to encounter.   Current Outpatient Prescriptions on File Prior to Encounter  Medication Sig Dispense Refill  . ferrous sulfate 325 (65 FE) MG tablet Take 1 tablet (325 mg total) by mouth 2 (two) times daily before a meal.  60 tablet  11  . oxyCODONE-acetaminophen (PERCOCET) 5-325 MG per tablet Take 2 tablets by mouth every 6 (six)  hours as needed for pain.  60 tablet  0  . Prenatal Vit-Fe Fumarate-FA (PRENATAL MULTIVITAMIN) TABS tablet Take 1 tablet by mouth daily at 12 noon.       Allergies  Allergen Reactions  . Other     Pt reports allergy to any type of melon fruit.  . Vicodin (Hydrocodone-Acetaminophen) Other (See Comments)    Pt reports seizures    ROS: Pertinent items in HPI  OBJECTIVE Blood pressure 114/68, pulse 111, temperature 98.7 F (37.1 C), temperature source Oral, resp. rate 20, last menstrual period 12/23/2012, SpO2 99.00%. GENERAL: Well-developed, well-nourished female in mild distress.  HEENT: Normocephalic HEART: normal rate RESP: normal effort ABDOMEN: Moderately distended, appropriately tender. Superficial separation of port site on right lower abdomen. No bleeding or drainage. (Skin and then closed with Dermabond) Positive bowel sounds. Bruising around the umbilicus. EXTREMITIES: Nontender, no edema NEURO: Alert and oriented  LAB RESULTS N/A  IMAGING N/A  MAU COURSE N/A  ASSESSMENT 1. Post-operative complication, initial encounter    superficial surgical wound dehiscence  PLAN Discharge home in stable condition. Encouraged pick up pain medication prescriptions after MAU visit  intake hysterectomy.     Follow-up Information   Follow up with Roseanna Rainbow, MD. (as scheduled)    Contact information:   [address] [place] [address] [phone]       Follow up with [facility] [place] MATERN[facility] ADMISSIONS. (As needed if symptoms worsen)    Contact information:   [address] [id number] [place] [address] [phone]       Medication List         ferrous sulfate 325 (65 FE) MG tablet  Take 1 tablet (325 mg total) by mouth 2 (two) times daily before a meal.     oxyCODONE-acetaminophen 5-325 MG per tablet  Commonly known as:  PERCOCET  Take 2 tablets by mouth every 6 (six) hours as needed for pain.      polyethylene glycol packet  Commonly known as:  MIRALAX / GLYCOLAX  Take 17 g by mouth daily.     prenatal multivitamin Tabs tablet  Take 1 tablet by mouth daily at 12 noon.       [place], [place] 02/09/2013  8:55 PM

## 2013-02-09 NOTE — Plan of Care (Signed)
Problem: Discharge Progression Outcomes Goal: Complications resolved/controlled Outcome: Progressing Pt still resistant to ambulating in hall,  Refused to get up to void during night shift,  Discussed the complications of not ambulating with her.

## 2013-02-09 NOTE — Discharge Summary (Signed)
  Physician Discharge Summary  Patient ID: Nicole Landry MRN: [id number] DOB/AGE: 11/11/1985 27 y.o.  Admit date: 02/08/2013 Discharge date: 02/09/2013  Admission Diagnoses: Tubal ectopic pregnancy Discharge Diagnoses:  Active Problems:   Ruptured ectopic pregnancy   Discharged Condition: stable  Hospital Course: On 02/08/2013, the patient underwent the following: Procedure(s): LAPAROSCOPY OPERATIVE  SALPINGECTOMY.   The postoperative course was uneventful.  She was discharged to home on postoperative day 1 tolerating a regular diet.  Consults: None  Significant Diagnostic Studies: radiology: Ultrasound: pelvic  Treatments: surgery: see above  Discharge Exam: Blood pressure 136/55, pulse 121, temperature 98.7 F (37.1 C), temperature source Oral, resp. rate 18, height 5\' 1"  (1.549 m), weight 216 lb (97.977 kg), last menstrual period 12/23/2012, SpO2 98.00%. General appearance: alert GI: soft, non-tender; bowel sounds normal; no masses,  no organomegaly Extremities: extremities normal, atraumatic, no cyanosis or edema Incision/Wound:  C/D/I  Disposition: 01-Home or Self Care      Discharge Orders   Future Orders Complete By Expires     Call MD for:  extreme fatigue  As directed     Call MD for:  persistant dizziness or light-headedness  As directed     Call MD for:  persistant nausea and vomiting  As directed     Call MD for:  redness, tenderness, or signs of infection (pain, swelling, redness, odor or green/yellow discharge around incision site)  As directed     Call MD for:  severe uncontrolled pain  As directed     Call MD for:  temperature >100.4  As directed     Diet - low sodium heart healthy  As directed     Discharge wound care:  As directed     Comments:      Keep clean and dry    Driving Restrictions  As directed     Comments:      No driving while taking narcotics    Increase activity slowly  As directed     May shower / Bathe  As directed     Comments:       No tub baths for 6 weeks    May walk up steps  As directed     Sexual Activity Restrictions  As directed     Comments:      No intercourse for 6  weeks        Medication List         ferrous sulfate 325 (65 FE) MG tablet  Take 1 tablet (325 mg total) by mouth 2 (two) times daily before a meal.     oxyCODONE-acetaminophen 5-325 MG per tablet  Commonly known as:  PERCOCET  Take 2 tablets by mouth every 6 (six) hours as needed for pain.     prenatal multivitamin Tabs tablet  Take 1 tablet by mouth daily at 12 noon.       Follow-up Information   Follow up with Antionette Char A, MD. Schedule an appointment as soon as possible for a visit in 2 weeks.   Contact information:   [address] [place] [address] [phone]       Signed: Roseanna Rainbow 02/09/2013, 9:01 AM

## 2013-02-09 NOTE — Plan of Care (Signed)
Problem: Discharge Progression Outcomes Goal: Pain controlled with appropriate interventions Outcome: Progressing Pt c consistent c/o pain rated "6" despite oral medications, ultram, x-tra strength tylenol, motrin.  So at 12 15 I gave her 0.5 mg Dilaudid IV.  Then she c/o of dizziness.  She rested for 1 hr, then was able to ambulate in hall, but needed the stedy.  Continued to lean heavily on her husband when ambulating.  Was hesitant to ambulate at all. She was voiding adequate amounts, tolerating PO intake.

## 2013-02-09 NOTE — MAU Note (Signed)
Pt reports she had surgery for ectopic last pm and her incision is opened.

## 2013-02-11 ENCOUNTER — Telehealth (HOSPITAL_COMMUNITY): Payer: Self-pay | Admitting: *Deleted

## 2013-02-14 ENCOUNTER — Telehealth: Payer: Self-pay | Admitting: *Deleted

## 2013-02-14 ENCOUNTER — Encounter: Payer: Self-pay | Admitting: *Deleted

## 2013-02-14 NOTE — Telephone Encounter (Signed)
Patient is post surgical and having some concerns-  Patient had surgery for ectopic rupture. Patient is presently taking Percocet/Iron for pain and anemia.  Patient c/o headaches with cramping and neck pain, nausea, weakness and dizziness. Patient also states she has had a increase in bleeding and clots. Most symptoms are to be expected after surgical event and blood loss per dr Tamela Oddi. Did advise patient is things got worse she should go to MAU for evaluation.

## 2013-02-14 NOTE — Telephone Encounter (Signed)
Call to patient to check on status of symptoms- per patient she still is experiencing dizziness and pain when standing. She isn't eating as she should due to her bowels moving cause her pain. Patient states she has a constant headache very migraine like with nausea, but no vomiting. She is taking Iron at this time. Patient reports her BP was 98/60 last night at 8:00. Patient has been given a follow up appointment with Dr Tamela Oddi for this week.

## 2013-02-15 ENCOUNTER — Ambulatory Visit (INDEPENDENT_AMBULATORY_CARE_PROVIDER_SITE_OTHER): Payer: BC Managed Care – PPO | Admitting: Obstetrics & Gynecology

## 2013-02-15 ENCOUNTER — Observation Stay (HOSPITAL_COMMUNITY)
Admission: AD | Admit: 2013-02-15 | Discharge: 2013-02-16 | Disposition: A | Discharge status: Home / Self Care | Payer: BC Managed Care – PPO | Source: Ambulatory Visit | Attending: Obstetrics & Gynecology | Admitting: Obstetrics & Gynecology

## 2013-02-15 ENCOUNTER — Encounter (HOSPITAL_COMMUNITY): Payer: Self-pay | Admitting: Anesthesiology

## 2013-02-15 ENCOUNTER — Encounter (HOSPITAL_COMMUNITY): Payer: Self-pay | Admitting: *Deleted

## 2013-02-15 ENCOUNTER — Encounter: Payer: Self-pay | Admitting: Obstetrics & Gynecology

## 2013-02-15 ENCOUNTER — Ambulatory Visit: Payer: Self-pay | Admitting: Obstetrics & Gynecology

## 2013-02-15 VITALS — BP 118/82 | HR 90 | Temp 98.9°F | Ht 64.0 in | Wt 213.0 lb

## 2013-02-15 DIAGNOSIS — D62 Acute posthemorrhagic anemia: Secondary | ICD-10-CM

## 2013-02-15 DIAGNOSIS — Y838 Other surgical procedures as the cause of abnormal reaction of the patient, or of later complication, without mention of misadventure at the time of the procedure: Secondary | ICD-10-CM | POA: Insufficient documentation

## 2013-02-15 DIAGNOSIS — IMO0002 Reserved for concepts with insufficient information to code with codable children: Secondary | ICD-10-CM | POA: Diagnosis not present

## 2013-02-15 DIAGNOSIS — O009 Unspecified ectopic pregnancy without intrauterine pregnancy: Secondary | ICD-10-CM

## 2013-02-15 MED ORDER — SIMETHICONE 80 MG PO CHEW: 80.0000 mg | CHEWABLE_TABLET | Freq: Four times a day (QID) | ORAL | Status: DC | PRN

## 2013-02-15 MED ORDER — ONDANSETRON HCL 4 MG PO TABS: 4.0000 mg | ORAL_TABLET | Freq: Four times a day (QID) | ORAL | Status: DC | PRN

## 2013-02-15 MED ORDER — ONDANSETRON HCL 4 MG/2ML IJ SOLN: 4.0000 mg | Freq: Four times a day (QID) | INTRAMUSCULAR | Status: DC | PRN

## 2013-02-15 MED ORDER — PRENATAL MULTIVITAMIN CH: 1.0000 | ORAL_TABLET | Freq: Every day | ORAL | Status: DC

## 2013-02-15 MED ORDER — IBUPROFEN 600 MG PO TABS: 600.0000 mg | ORAL_TABLET | Freq: Four times a day (QID) | ORAL | Status: DC | PRN

## 2013-02-15 MED ORDER — OXYCODONE-ACETAMINOPHEN 5-325 MG PO TABS
1.0000 | ORAL_TABLET | Freq: Four times a day (QID) | ORAL | Status: DC | PRN
  Administered 2013-02-15: 2 via ORAL
  Filled 2013-02-15: qty 2

## 2013-02-15 MED ORDER — SODIUM CHLORIDE 0.9 % IV SOLN: INTRAVENOUS | Status: DC

## 2013-02-15 NOTE — MAU Note (Signed)
Patient is to be a direct admit to [facility].

## 2013-02-15 NOTE — Progress Notes (Signed)
CRITICAL VALUE ALERT  Critical value received:  Hgb. 7.7  Date of notification:  02/15/13  Time of notification:  1735  Critical value read back:yes  Nurse who received alert:  Jerrell Mylar, RN  MD notified (1st page):  Dr. Tamela Oddi  Time of first page:  1739  MD notified (2nd page):  Time of second page:  Responding MD:  Dr. Tamela Oddi  Time MD responded:  Answered cell phone

## 2013-02-15 NOTE — Progress Notes (Signed)
Subjective:     Nicole Landry is a 27 y.o. female here for a routine exam.  Current complaints: Pt states she had surgery for a ruptured ectopic pregnancy on February 08, 2013. Pt states she is having pain in her abdomen and also having headaches. Pt states she is also having pain in her neck and down her right arm. Pt states the pain is not relieved by her pain medication. Pt states she is still having trouble urinating. Pt states she is able to go but it takes her a while. Pt is also having dizziness when standing.  Personal health questionnaire reviewed: yes.   Gynecologic History Patient's last menstrual period was 12/23/2012. Contraception: abstinence   Obstetric History OB History  Gravida Para Term Preterm AB SAB TAB Ectopic Multiple Living  3 1 1  1 1    1     # Outcome Date GA Lbr Len/2nd Weight Sex Delivery Anes PTL Lv  3 GRA              Comments: System Generated. Please review and update pregnancy details.  2 SAB           1 TRM                The following portions of the patient's history were reviewed and updated as appropriate: allergies, current medications, past family history, past medical history, past social history, past surgical history and problem list.  Review of Systems Pertinent items are noted in HPI.    Objective:     Abd: large, suprapubic ecchymosis     Assessment:   Anemia secondary to acute blood loss Symptomatic   Plan:   To [facility] for transfusion

## 2013-02-15 NOTE — Progress Notes (Signed)
Pt IV infiltrated at 2100... Blood transfusion paused... 2 RN's looked for new IV site.. Pt stated she did not want to be stuck anymore... Dr. Tamela Oddi notified and spoke with pt via telephone. Pt agreed to have CRNA attempt to stick her. CRNA came to floor at 2215 and got new site at 2230 in left forearm. Blood bank notified at 2240 to see if it was okay to restart unit... Stated it was okay as long as the blood completed within 4 hours of leaving the blood bank. Unit left blood bank at 2022... Will continue to monitor pt.

## 2013-02-15 NOTE — H&P (Signed)
Nicole Landry is an 27 y.o. female. She presents with abdominal pain, headache and dizziness.  She is 1 week s/p a laparoscopic salpingectomy for a ruptured tubal ectopic pregnancy.  There was a large hemoperitoneum at the time of surgery and hemoglobin on POD#1 was 7.7.  Pertinent Gynecological History:  Sexually transmitted diseases: no past history Previous GYN Procedures: none     Menstrual History:  Patient's last menstrual period was 12/23/2012.    Past Medical History  Diagnosis Date  . Gestational diabetes   . Ovarian cyst     Past Surgical History  Procedure Laterality Date  . Cesarean section    . Laparoscopy N/A 02/08/2013    Procedure: LAPAROSCOPY OPERATIVE;  Surgeon: Antionette Char, MD;  Location: [facility];  Service: Gynecology;  Laterality: N/A;  . Bilateral salpingectomy Right 02/08/2013    Procedure:  SALPINGECTOMY;  Surgeon: Antionette Char, MD;  Location: [facility];  Service: Gynecology;  Laterality: Right;    History reviewed. No pertinent family history.  Social History:  reports that she has never smoked. She does not have any smokeless tobacco history on file. She reports that she does not drink alcohol or use illicit drugs.  Allergies:  Allergies  Allergen Reactions  . 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  . Vicodin [Hydrocodone-Acetaminophen] Other (See Comments)    Pt reports seizures    Prescriptions prior to admission  Medication Sig Dispense Refill  . ferrous sulfate 325 (65 FE) MG tablet Take 1 tablet (325 mg total) by mouth 2 (two) times daily before a meal.  60 tablet  11  . ibuprofen (ADVIL,MOTRIN) 200 MG tablet Take 400 mg by mouth every 8 (eight) hours as needed for pain or headache.      . oxyCODONE-acetaminophen (PERCOCET) 5-325 MG per tablet Take 2 tablets by mouth every 6 (six) hours as needed for pain.  60 tablet  0  . polyethylene glycol (MIRALAX / GLYCOLAX) packet Take 17 g by mouth daily.  14  each  0  . Prenatal Vit-Fe Fumarate-FA (PRENATAL MULTIVITAMIN) TABS tablet Take 1 tablet by mouth daily at 12 noon.        Review of Systems  Constitutional: Negative for fever.  Eyes: Negative for blurred vision.  Respiratory: Negative for shortness of breath.   Gastrointestinal: Positive for abdominal pain. Negative for nausea and vomiting.  Skin: Negative for rash.  Neurological: Positive for dizziness and headaches.    Blood pressure 120/76, pulse 111, temperature 98.1 F (36.7 C), temperature source Oral, resp. rate 18, height 5\' 4"  (1.626 m), weight 213 lb (96.616 kg), last menstrual period 12/23/2012, SpO2 99.00%, unknown if currently breastfeeding. Physical Exam  Constitutional: She appears well-developed.  HENT:  Head: Normocephalic.  Neck: Neck supple. No thyromegaly present.  Cardiovascular: Normal rate and regular rhythm.   Respiratory: Breath sounds normal.  GI: Soft. Bowel sounds are normal.  Large ecchymosis, suprapubic  Skin: No rash noted.    Results for orders placed during the hospital encounter of 02/15/13 (from the past 24 hour(s))  TYPE AND SCREEN     Status: None   Collection Time    02/15/13  5:00 PM      Result Value Range   ABO/RH(D) O POS     Antibody Screen NEG     Sample Expiration 02/18/2013     Unit Number [id number]     Blood Component Type RED CELLS,LR     Unit division 00  Status of Unit ALLOCATED     Transfusion Status OK TO TRANSFUSE     Crossmatch Result Compatible     Unit Number [id number]     Blood Component Type RED CELLS,LR     Unit division 00     Status of Unit ISSUED     Transfusion Status OK TO TRANSFUSE     Crossmatch Result Compatible    HEMOGLOBIN AND HEMATOCRIT, BLOOD     Status: Abnormal   Collection Time    02/15/13  5:00 PM      Result Value Range   Hemoglobin 7.5 (*) 12.0 - 15.0 g/dL   HCT 11.9 (*) 14.7 - 82.9 %  PREPARE RBC (CROSSMATCH)     Status: None   Collection Time    02/15/13  5:00 PM       Result Value Range   Order Confirmation ORDER PROCESSED BY BLOOD BANK        Assessment/Plan: Anemia secondary to acute blood loss Abdominal wall hematoma   --> observation; transfuse 2 units PRBC; check post transfusion H/H  JACKSON-MOORE,Leeon Makar A 02/15/2013, 8:29 PM

## 2013-02-16 ENCOUNTER — Encounter: Payer: Self-pay | Admitting: Obstetrics & Gynecology

## 2013-02-16 DIAGNOSIS — D62 Acute posthemorrhagic anemia: Secondary | ICD-10-CM | POA: Diagnosis not present

## 2013-02-16 LAB — CBC
HCT: 27.7 % — ABNORMAL LOW (ref 36.0–46.0)
Hemoglobin: 9 g/dL — ABNORMAL LOW (ref 12.0–15.0)
MCH: 28.1 pg (ref 26.0–34.0)
MCV: 86.6 fL (ref 78.0–100.0)
RBC: 3.2 MIL/uL — ABNORMAL LOW (ref 3.87–5.11)

## 2013-02-16 NOTE — Progress Notes (Signed)
Nicole Landry was very open about talking through and beginning to process the stress of the last few weeks.  She described the day of her surgery as happening "very fast" without time to process and without the support that she needed.  This may be a sign that the procedure and events leading up to it were traumatic for her.  I spent over an hour with her as she re-told the story of her surgery and processed all that she was feling.  We also talked about the loss that she experienced and her grief.  She and her husband are a good support to each other and they have some support in his family.  They are anticipating a move and a change in school/jobs--each of these are another factor in the stress that she is experiencing.  I provided reflective listening and helped her talk through some theological questions that emerged for her throughout all of this.  [employer] Pager, [phone] 10:07 AM   02/16/13 1000  Clinical Encounter Type  Visited With Patient  Visit Type Spiritual support;Initial  Referral From Nurse  Stress Factors  Patient Stress Factors Loss;Exhausted;Health changes
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
## 2013-02-16 NOTE — Discharge Summary (Signed)
Physician Discharge Summary  Patient ID: Nicole Landry MRN: [id number] DOB/AGE: Jan 12, 1986 27 y.o.  Admit date: 02/15/2013 Discharge date: 02/16/2013  Admission Diagnoses: Active Problems:   Acute blood loss anemia  Discharge Diagnoses:  Active Problems:   Acute blood loss anemia   Discharged Condition: stable  Hospital Course: A hemoglobin on admission was 7.5.  She was transfused 2 units PRBCs.  A post transfusion hemoglobin was 9; the patient was less symptomatic.  Consults: None  Significant Diagnostic Studies: labs: see above  Treatments: IV hydration and see above  Discharge Exam: Blood pressure 98/66, pulse 72, temperature 98 F (36.7 C), temperature source Oral, resp. rate 18, height 5\' 4"  (1.626 m), weight 213 lb (96.616 kg), last menstrual period 12/23/2012, SpO2 99.00%, unknown if currently breastfeeding. General appearance: alert GI: soft, non-tender; bowel sounds normal; no masses,  no organomegaly Extremities: extremities normal, atraumatic, no cyanosis or edema Skin: Skin color, texture, turgor normal. No rashes or lesions or Suprapubic ecchymosis Incision/Wound: C/D/I  Disposition: 01-Home or Self Care  Discharge Orders   Future Appointments Provider Department Dept Phone   02/27/2013 4:00 PM Antionette Char, MD [facility] [phone]   Future Orders Complete By Expires   Call MD for:  extreme fatigue  As directed    Call MD for:  persistant dizziness or light-headedness  As directed    Call MD for:  persistant nausea and vomiting  As directed    Call MD for:  redness, tenderness, or signs of infection (pain, swelling, redness, odor or green/yellow discharge around incision site)  As directed    Call MD for:  severe uncontrolled pain  As directed    Call MD for:  temperature >100.4  As directed    Diet - low sodium heart healthy  As directed    Discharge wound care:  As directed    Comments:     Keep clean and dry   Driving  Restrictions  As directed    Comments:     While taking narcotics.   Increase activity slowly  As directed    Lifting restrictions  As directed    Comments:     No lifting > 5 lbs for 6 weeks   May shower / Bathe  As directed    Comments:     No tub baths for 6 weeks   May walk up steps  As directed    Sexual Activity Restrictions  As directed    Comments:     No intercourse for 6  weeks       Medication List         ferrous sulfate 325 (65 FE) MG tablet  Take 1 tablet (325 mg total) by mouth 2 (two) times daily before a meal.     ibuprofen 200 MG tablet  Commonly known as:  ADVIL,MOTRIN  Take 400 mg by mouth every 8 (eight) hours as needed for pain or headache.     oxyCODONE-acetaminophen 5-325 MG per tablet  Commonly known as:  PERCOCET  Take 2 tablets by mouth every 6 (six) hours as needed for pain.     polyethylene glycol packet  Commonly known as:  MIRALAX / GLYCOLAX  Take 17 g by mouth daily.     prenatal multivitamin Tabs tablet  Take 1 tablet by mouth daily at 12 noon.           Follow-up Information   Follow up with Roseanna Rainbow, MD. Schedule an appointment as soon as  possible for a visit in 2 weeks.   Specialty:  Obstetrics and Gynecology   Contact information:   [address] [place] [address] [phone]       Signed: Roseanna Rainbow 02/16/2013, 10:52 AM

## 2013-02-17 ENCOUNTER — Ambulatory Visit: Admit: 2013-02-17 | Payer: Self-pay | Admitting: Obstetrics & Gynecology

## 2013-02-17 LAB — TYPE AND SCREEN
ABO/RH(D): O POS
Antibody Screen: NEGATIVE
Unit division: 0
Unit division: 0

## 2013-02-27 ENCOUNTER — Ambulatory Visit (INDEPENDENT_AMBULATORY_CARE_PROVIDER_SITE_OTHER): Payer: BC Managed Care – PPO | Admitting: Obstetrics & Gynecology

## 2013-02-27 ENCOUNTER — Encounter: Payer: Self-pay | Admitting: Obstetrics & Gynecology

## 2013-02-27 VITALS — BP 100/68 | HR 72 | Temp 98.9°F | Ht 64.0 in | Wt 215.0 lb

## 2013-02-27 DIAGNOSIS — Z309 Encounter for contraceptive management, unspecified: Secondary | ICD-10-CM

## 2013-02-27 DIAGNOSIS — Z09 Encounter for follow-up examination after completed treatment for conditions other than malignant neoplasm: Secondary | ICD-10-CM

## 2013-02-27 MED ORDER — NORETHIN-ETH ESTRAD-FE BIPHAS 1 MG-10 MCG / 10 MCG PO TABS: 1.0000 | ORAL_TABLET | Freq: Every day | ORAL | Status: DC

## 2013-02-27 NOTE — Progress Notes (Signed)
Subjective:     Nicole Landry is a 27 y.o. female here for a routine exam.  Current complaints: post op follow up. Pt had a ruptured ectopic pregnancy. Pt states she was admitted back to the hospital 02-15-13 and received a blood transfusion. Pt states she is feeling much better. Personal health questionnaire reviewed: no.   Gynecologic History Patient's last menstrual period was 12/23/2012. Contraception: none   Obstetric History OB History  Gravida Para Term Preterm AB SAB TAB Ectopic Multiple Living  3 1 1  2 1  1  1     # Outcome Date GA Lbr Len/2nd Weight Sex Delivery Anes PTL Lv  3 ECT              Comments: System Generated. Please review and update pregnancy details.  2 SAB           1 TRM                The following portions of the patient's history were reviewed and updated as appropriate: allergies, current medications, past family history, past medical history, past social history, past surgical history and problem list.  Review of Systems Pertinent items are noted in HPI.    Objective:     Abd: ecchymosis resolved     Assessment:   Clinically improved  Plan:   Continue Fe/Vit Start COCP Return in 1 mth

## 2013-02-27 NOTE — Patient Instructions (Signed)

## 2013-03-27 ENCOUNTER — Ambulatory Visit (INDEPENDENT_AMBULATORY_CARE_PROVIDER_SITE_OTHER): Payer: BC Managed Care – PPO | Admitting: Obstetrics & Gynecology

## 2013-03-27 VITALS — BP 111/77 | HR 89 | Temp 98.2°F | Wt 224.0 lb

## 2013-03-27 DIAGNOSIS — Z09 Encounter for follow-up examination after completed treatment for conditions other than malignant neoplasm: Secondary | ICD-10-CM

## 2013-03-27 DIAGNOSIS — Z23 Encounter for immunization: Secondary | ICD-10-CM

## 2013-03-27 NOTE — Progress Notes (Signed)
Subjective:     Nicole Landry is a 27 y.o. female here for a routine exam.  Current complaints: Patient is in the office today for post operative appointment. Patient had emergency surgery on 02/08/2013 for ectopic rupture. Patient states she has not had a cycle yet. Patient states she is healing well- bowels are regular with no urinary complaints.  Personal health questionnaire reviewed: no.   Gynecologic History Patient's last menstrual period was 12/23/2012. Contraception: OCP (estrogen/progesterone)   Obstetric History OB History  Gravida Para Term Preterm AB SAB TAB Ectopic Multiple Living  3 1 1  2 1  1  1     # Outcome Date GA Lbr Len/2nd Weight Sex Delivery Anes PTL Lv  3 ECT              Comments: System Generated. Please review and update pregnancy details.  2 SAB           1 TRM                The following portions of the patient's history were reviewed and updated as appropriate: allergies, current medications, past family history, past medical history, past social history, past surgical history and problem list.  Review of Systems Pertinent items are noted in HPI.    Objective:   General:  alert     Abdomen: soft, non-tender; bowel sounds normal; no masses,  no organomegaly   Vulva:  normal  Vagina: normal vagina  Cervix:  no lesions  Corpus: normal size, contour, position, consistency, mobility, non-tender  Adnexa:  normal adnexa      Assessment:   Normal postop exam  Plan:   Return prn

## 2013-03-29 ENCOUNTER — Encounter: Payer: Self-pay | Admitting: Obstetrics & Gynecology

## 2013-03-29 NOTE — Patient Instructions (Addendum)
Preparing for Pregnancy Preparing for pregnancy (preconceptual care) by getting counseling and information from your caregiver before getting pregnant is a good idea. It will help you and your baby have a better chance to have a healthy, safe pregnancy and delivery of your baby. Make an appointment with your caregiver to talk about your health, medical, and family history and how to prepare yourself before getting pregnant. Your caregiver will do a complete physical exam and a Pap test. They will want to know:  About you, your spouse or partner, and your family's medical and genetic history.  If you are eating a balanced diet and drinking enough fluids.  What vitamins and mineral supplements you are taking. This includes taking folic acid before getting pregnant to help prevent birth defects.  What medications you are taking including prescription, over-the-counter and herbal medications.  If there is any substance abuse like alcohol, smoking, and illegal drugs.  If there is any mental or physical domestic violence.  If there is any risk of sexually transmitted disease between you and your partner.  What immunizations and vaccinations you have had and what you may need before getting pregnant.  If you should get tested for HIV infection.  If there is any exposure to chemical or toxic substances at home or work.  If there are medical problems you have that need to be treated and kept under control before getting pregnant such as diabetes, high blood pressure or others.  If there were any past surgeries, pregnancies and problems with them.  What your current weight is and to set a goal as to how much weight you should gain while pregnant. Also, they will check if you should lose or gain weight before getting pregnant.  What is your exercise routine and what it is safe when you are pregnant.  If there are any physical disabilities that need to be addressed.  About spacing your  pregnancies when there are other children.  If there is a financial problem that may affect you having a child. After talking about the above points with your caregiver, your caregiver will give you advice on how to help treat and work with you on solving any issues, if necessary, before getting pregnant. The goal is to have a healthy and safe pregnancy for you and your baby. You should keep an accurate record of your menstrual periods because it will help in determining your due date. Immunizations that you should have before getting pregnant:   Regular measles, [place] measles (rubella) and mumps.  Tetanus and diphtheria.  Chickenpox, if not immune.  Herpes zoster (Varicella) if not immune.  Human papilloma virus vaccine (HPV) between the age of 45 and 52 years old.  Hepatitis A vaccine.  Hepatitis B vaccine.  Influenza vaccine.  Pneumococcal vaccine (pneumonia). You should avoid getting pregnant for one month after getting vaccinated with a live virus vaccine such as [place] measles (rubella) vaccine. Other immunizations may be necessary depending on where you live, such as malaria. Ask your caregiver if any other immunizations are needed for you. HOME CARE INSTRUCTIONS   Follow the advice of your caregiver.  Before getting pregnant:  Begin taking vitamins, supplements, and 0.4 milligrams folic acid daily.  Get your immunizations up-to-date.  Get help from a nutrition counselor if you do not understand what a balanced diet is, need help with a special medical diet or if you need help to lose or gain weight.  Begin exercising.  Stop smoking, taking illegal drugs,  and drinking alcoholic beverages.  Get counseling if there is and type of domestic violence.  Get checked for sexually transmitted diseases including HIV.  Get any medical problems under control (diabetes, high blood pressure, convulsions, asthma or others).  Resolve any financial concerns.  Be sure you and  your spouse or partner are ready to have a baby.  Keep an accurate record of your menstrual periods. Document Released: 06/04/2008 Document Revised: 09/14/2011 Document Reviewed: 06/04/2008 [facility] Patient Information 2014 [place], [place]. Influenza Vaccine (Flu Vaccine, Inactivated) 2013 2014 What You Need to Know WHY GET VACCINATED?  Influenza ("flu") is a contagious disease that spreads around the [place] every winter, usually between October and May.  Flu is caused by the influenza virus, and can be spread by coughing, sneezing, and close contact.  Anyone can get flu, but the risk of getting flu is highest among children. Symptoms come on suddenly and may last several days. They can include:  Fever or chills.  Sore throat.  Muscle aches.  Fatigue.  Cough.  Headache.  Runny or stuffy nose. Flu can make some people much sicker than others. These people include young children, people 8 and older, pregnant women, and people with certain health conditions such as heart, lung or kidney disease, or a weakened immune system. Flu vaccine is especially important for these people, and anyone in close contact with them. Flu can also lead to pneumonia, and make existing medical conditions worse. It can cause diarrhea and seizures in children. Each year thousands of people in the [place] die from flu, and many more are hospitalized. Flu vaccine is the best protection we have from flu and its complications. Flu vaccine also helps prevent spreading flu from person to person. INACTIVATED FLU VACCINE There are 2 types of influenza vaccine:  You are getting an inactivated flu vaccine, which does not contain any live influenza virus. It is given by injection with a needle, and often called the "flu shot."  A different live, attenuated (weakened) influenza vaccine is sprayed into the nostrils. This vaccine is described in a separate Vaccine Information Statement. Flu vaccine is  recommended every year. Children 6 months through 10 years of age should get 2 doses the first year they get vaccinated. Flu viruses are always changing. Each year's flu vaccine is made to protect from viruses that are most likely to cause disease that year. While flu vaccine cannot prevent all cases of flu, it is our best defense against the disease. Inactivated flu vaccine protects against 3 or 4 different influenza viruses. It takes about 2 weeks for protection to develop after the vaccination, and protection lasts several months to a year. Some illnesses that are not caused by influenza virus are often mistaken for flu. Flu vaccine will not prevent these illnesses. It can only prevent influenza. A "high-dose" flu vaccine is available for people 33 years of age and older. The person giving you the vaccine can tell you more about it. Some inactivated flu vaccine contains a very small amount of a mercury-based preservative called thimerosal. Studies have shown that thimerosal in vaccines is not harmful, but flu vaccines that do not contain a preservative are available. SOME PEOPLE SHOULD NOT GET THIS VACCINE Tell the person who gives you the vaccine:  If you have any severe (life-threatening) allergies. If you ever had a life-threatening allergic reaction after a dose of flu vaccine, or have a severe allergy to any part of this vaccine, you may be advised not  to get a dose. Most, but not all, types of flu vaccine contain a small amount of egg.  If you ever had Guillain Barr Syndrome (a severe paralyzing illness, also called GBS). Some people with a history of GBS should not get this vaccine. This should be discussed with your doctor.  If you are not feeling well. They might suggest waiting until you feel better. But you should come back. RISKS OF A VACCINE REACTION With a vaccine, like any medicine, there is a chance of side effects. These are usually mild and go away on their own. Serious side  effects are also possible, but are very rare. Inactivated flu vaccine does not contain live flu virus, sogetting flu from this vaccine is not possible. Brief fainting spells and related symptoms (such as jerking movements) can happen after any medical procedure, including vaccination. Sitting or lying down for about 15 minutes after a vaccination can help prevent fainting and injuries caused by falls. Tell your doctor if you feel dizzy or lightheaded, or have vision changes or ringing in the ears. Mild problems following inactivated flu vaccine:  Soreness, redness, or swelling where the shot was given.  Hoarseness; sore, red or itchy eyes; or cough.  Fever.  Aches.  Headache.  Itching.  Fatigue. If these problems occur, they usually begin soon after the shot and last 1 or 2 days. Moderate problems following inactivated flu vaccine:  Young children who get inactivated flu vaccine and pneumococcal vaccine (PCV13) at the same time may be at increased risk for seizures caused by fever. Ask your doctor for more information. Tell your doctor if a child who is getting flu vaccine has ever had a seizure. Severe problems following inactivated flu vaccine:  A severe allergic reaction could occur after any vaccine (estimated less than 1 in a million doses).  There is a small possibility that inactivated flu vaccine could be associated with Guillan Barr Syndrome (GBS), no more than 1 or 2 cases per million people vaccinated. This is much lower than the risk of severe complications from flu, which can be prevented by flu vaccine. The safety of vaccines is always being monitored. For more information, visit: http://floyd.org/ WHAT IF THERE IS A SERIOUS REACTION? What should I look for?  Look for anything that concerns you, such as signs of a severe allergic reaction, very high fever, or behavior changes. Signs of a severe allergic reaction can include hives, swelling of the face and  throat, difficulty breathing, a fast heartbeat, dizziness, and weakness. These would start a few minutes to a few hours after the vaccination. What should I do?  If you think it is a severe allergic reaction or other emergency that cannot wait, call 9 1 1  or get the person to the nearest hospital. Otherwise, call your doctor.  Afterward, the reaction should be reported to the Vaccine Adverse Event Reporting System (VAERS). Your doctor might file this report, or you can do it yourself through the VAERS website at www.vaers.LAgents.no, or by calling 1-431-560-4269. VAERS is only for reporting reactions. They do not give medical advice. THE NATIONAL VACCINE INJURY COMPENSATION PROGRAM The National Vaccine Injury Compensation Program (VICP) is a federal program that was created to compensate people who may have been injured by certain vaccines. Persons who believe they may have been injured by a vaccine can learn about the program and about filing a claim by calling 1-(740)309-6115 or visiting the VICP website at SpiritualWord.at HOW CAN I LEARN MORE?  Ask your  doctor.  Call your local or state health department.  Contact the Centers for Disease Control and Prevention (CDC):  Call 956-590-2052 (1-800-CDC-INFO) or  Visit CDC's website at BiotechRoom.com.cy CDC Inactivated Influenza Vaccine Interim VIS (01/29/12) Document Released: 04/16/2006 Document Revised: 03/16/2012 Document Reviewed: 01/29/2012 [facility] Patient Information 2014 [place], [place].

## 2013-07-12 ENCOUNTER — Ambulatory Visit: Payer: BC Managed Care – PPO | Admitting: Obstetrics & Gynecology

## 2013-08-24 ENCOUNTER — Encounter (HOSPITAL_COMMUNITY): Payer: Self-pay | Admitting: *Deleted

## 2013-08-24 ENCOUNTER — Inpatient Hospital Stay (HOSPITAL_COMMUNITY)
Admission: AD | Admit: 2013-08-24 | Discharge: 2013-08-24 | Disposition: A | Discharge status: Home / Self Care | Payer: BC Managed Care – PPO | Source: Ambulatory Visit | Attending: Obstetrics | Admitting: Obstetrics

## 2013-08-24 DIAGNOSIS — Z3202 Encounter for pregnancy test, result negative: Secondary | ICD-10-CM | POA: Insufficient documentation

## 2013-08-24 DIAGNOSIS — N912 Amenorrhea, unspecified: Secondary | ICD-10-CM | POA: Insufficient documentation

## 2013-08-24 DIAGNOSIS — R42 Dizziness and giddiness: Secondary | ICD-10-CM | POA: Insufficient documentation

## 2013-08-24 DIAGNOSIS — N911 Secondary amenorrhea: Secondary | ICD-10-CM

## 2013-08-24 LAB — CBC
HEMATOCRIT: 36.5 % (ref 36.0–46.0)
Hemoglobin: 12 g/dL (ref 12.0–15.0)
MCH: 28.1 pg (ref 26.0–34.0)
MCHC: 32.9 g/dL (ref 30.0–36.0)
MCV: 85.5 fL (ref 78.0–100.0)
PLATELETS: 332 10*3/uL (ref 150–400)
RBC: 4.27 MIL/uL (ref 3.87–5.11)
RDW: 13 % (ref 11.5–15.5)
WBC: 3.9 10*3/uL — AB (ref 4.0–10.5)

## 2013-08-24 LAB — POCT PREGNANCY, URINE: Preg Test, Ur: NEGATIVE

## 2013-08-24 MED ORDER — PSEUDOEPHEDRINE HCL 30 MG PO TABS: 30.0000 mg | ORAL_TABLET | ORAL | Status: DC | PRN

## 2013-08-24 MED ORDER — MECLIZINE HCL 32 MG PO TABS: 32.0000 mg | ORAL_TABLET | Freq: Three times a day (TID) | ORAL | Status: DC | PRN

## 2013-08-24 NOTE — MAU Note (Addendum)
Hx of ruptured  Ectopic 6 months.  Had + preg test 2wks ago, neg test today. Had some spotting at the beginning of the month. (pink then brown when wiped x 4 days.)  Has been dizzy and fatigued.

## 2013-08-24 NOTE — MAU Provider Note (Signed)
History     CSN: [id number]  Arrival date and time: 08/24/13 1222   First Provider Initiated Contact with Patient 08/24/13 1313      Chief Complaint  Patient presents with  . Possible Pregnancy   Possible Pregnancy Associated symptoms include weakness. Pertinent negatives include no abdominal pain, chills, fever, headaches, nausea or vomiting.   This is a 28 y.o. female who presents with c/o missed period (due 2/7), spotting and dizziness/fatigue.  Had a positive pregnancy test two weeks ago and Negative one today. States is "actively having sex" but denies "trying again".  But not contracepting. Periods resumed after etopic in December, had one then and in January.   RN Note:  Hx of ruptured Ectopic 6 months. Had + preg test 2wks ago, neg test today. Had some spotting at the beginning of the month. (pink then brown when wiped x 4 days.) Has been dizzy and fatigued.       OB History   Grav Para Term Preterm Abortions TAB SAB Ect Mult Living   3 1 1  2  1 1  1       Past Medical History  Diagnosis Date  . Gestational diabetes   . Ovarian cyst     Past Surgical History  Procedure Laterality Date  . Cesarean section    . Laparoscopy N/A 02/08/2013    Procedure: LAPAROSCOPY OPERATIVE;  Surgeon: Antionette Char, MD;  Location: [facility];  Service: Gynecology;  Laterality: N/A;  . Bilateral salpingectomy Right 02/08/2013    Procedure:  SALPINGECTOMY;  Surgeon: Antionette Char, MD;  Location: [facility];  Service: Gynecology;  Laterality: Right;  . Salpingectomy Right     No family history on file.  History  Substance Use Topics  . Smoking status: Never Smoker   . Smokeless tobacco: Not on file  . Alcohol Use: No    Allergies:  Allergies  Allergen Reactions  . 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  . Vicodin [Hydrocodone-Acetaminophen] Other (See Comments)    Pt reports seizures    Prescriptions prior to admission  Medication Sig  Dispense Refill  . ferrous sulfate 325 (65 FE) MG tablet Take 1 tablet (325 mg total) by mouth 2 (two) times daily before a meal.  60 tablet  11  . ibuprofen (ADVIL,MOTRIN) 200 MG tablet Take 400 mg by mouth every 8 (eight) hours as needed for pain or headache.      . Norethindrone-Ethinyl Estradiol-Fe Biphas (LO LOESTRIN FE) 1 MG-10 MCG / 10 MCG tablet Take 1 tablet by mouth daily.  1 Package  11  . Prenatal Vit-Fe Fumarate-FA (PRENATAL MULTIVITAMIN) TABS tablet Take 1 tablet by mouth daily at 12 noon.        Review of Systems  Constitutional: Positive for malaise/fatigue. Negative for fever and chills.  Gastrointestinal: Negative for nausea, vomiting and abdominal pain.  Genitourinary:       Pink/brown spotting  Neurological: Positive for dizziness and weakness. Negative for headaches.   Physical Exam   Blood pressure 128/93, pulse 101, temperature 98.9 F (37.2 C), temperature source Oral, resp. rate 18, height 5\' 2"  (1.575 m), weight 103.42 kg (228 lb), last menstrual period 07/15/2013.  Physical Exam  Constitutional: She is oriented to person, place, and time. She appears well-developed and well-nourished. No distress.  HENT:  Head: Normocephalic.  Cardiovascular: Normal rate.   Respiratory: Effort normal.  GI: Soft. She exhibits no distension. There is no tenderness. There is  no rebound and no guarding.  Genitourinary: Uterus normal. Vaginal discharge (creamy white, no blood, cervix closed, uterus small, firm and nontender) found.  Musculoskeletal: Normal range of motion.  Neurological: She is alert and oriented to person, place, and time.  Skin: Skin is warm and dry.  Psychiatric: She has a normal mood and affect.    MAU Course  Procedures  MDM Will check orthostatic VS, cbc and quant.  Filed Vitals:   08/24/13 1328 08/24/13 1330 08/24/13 1332 08/24/13 1442  BP: 116/74 107/71 114/63 114/77  Pulse: 85 88 82 73  Temp:    98.2 F (36.8 C)  TempSrc:    Oral  Resp: 18  20 18 20   Height:      Weight:      SpO2: 100% 100% 99%    Results for orders placed during the hospital encounter of 08/24/13 (from the past 24 hour(s))  POCT PREGNANCY, URINE     Status: None   Collection Time    08/24/13 12:58 PM      Result Value Ref Range   Preg Test, Ur NEGATIVE  NEGATIVE  CBC     Status: Abnormal   Collection Time    08/24/13  1:34 PM      Result Value Ref Range   WBC 3.9 (*) 4.0 - 10.5 K/uL   RBC 4.27  3.87 - 5.11 MIL/uL   Hemoglobin 12.0  12.0 - 15.0 g/dL   HCT 09.836.5  11.936.0 - 14.746.0 %   MCV 85.5  78.0 - 100.0 fL   MCH 28.1  26.0 - 34.0 pg   MCHC 32.9  30.0 - 36.0 g/dL   RDW 82.913.0  56.211.5 - 13.015.5 %   Platelets 332  150 - 400 K/uL  HCG, QUANTITATIVE, PREGNANCY     Status: None   Collection Time    08/24/13  1:34 PM      Result Value Ref Range   hCG, Beta Chain, Quant, S <1  <5 mIU/mL     Assessment and Plan  A:  Secondary Amenorrhea       Vertigo       Not pregnant       History of ruptured ectopic  P:  Discussed findings       Will follow amenorrhea in office       Rx Meclizine and sudafed for vertigo       [facility] Hospital[facility]ILLIAMS,Emiko Osorto 08/24/2013, 1:24 PM

## 2013-08-24 NOTE — Discharge Instructions (Signed)

## 2013-08-25 LAB — HCG, QUANTITATIVE, PREGNANCY: hCG, Beta Chain, Quant, S: <1 m[IU]/mL (ref <5)

## 2013-12-25 ENCOUNTER — Encounter (HOSPITAL_COMMUNITY): Payer: Self-pay | Admitting: Emergency Medicine

## 2013-12-25 ENCOUNTER — Emergency Department (HOSPITAL_COMMUNITY)
Admission: EM | Admit: 2013-12-25 | Discharge: 2013-12-25 | Disposition: A | Discharge status: Home / Self Care | Payer: Self-pay | Attending: Emergency Medicine | Admitting: Emergency Medicine

## 2013-12-25 DIAGNOSIS — Z8742 Personal history of other diseases of the female genital tract: Secondary | ICD-10-CM | POA: Insufficient documentation

## 2013-12-25 DIAGNOSIS — Z79899 Other long term (current) drug therapy: Secondary | ICD-10-CM | POA: Insufficient documentation

## 2013-12-25 DIAGNOSIS — M79671 Pain in right foot: Secondary | ICD-10-CM

## 2013-12-25 DIAGNOSIS — Y9389 Activity, other specified: Secondary | ICD-10-CM | POA: Insufficient documentation

## 2013-12-25 DIAGNOSIS — Y9289 Other specified places as the place of occurrence of the external cause: Secondary | ICD-10-CM | POA: Insufficient documentation

## 2013-12-25 DIAGNOSIS — W208XXA Other cause of strike by thrown, projected or falling object, initial encounter: Secondary | ICD-10-CM | POA: Insufficient documentation

## 2013-12-25 DIAGNOSIS — S8990XA Unspecified injury of unspecified lower leg, initial encounter: Secondary | ICD-10-CM | POA: Insufficient documentation

## 2013-12-25 DIAGNOSIS — Z8632 Personal history of gestational diabetes: Secondary | ICD-10-CM | POA: Insufficient documentation

## 2013-12-25 DIAGNOSIS — S99929A Unspecified injury of unspecified foot, initial encounter: Principal | ICD-10-CM

## 2013-12-25 DIAGNOSIS — S99919A Unspecified injury of unspecified ankle, initial encounter: Secondary | ICD-10-CM | POA: Insufficient documentation

## 2013-12-25 HISTORY — DX: Unspecified tubal pregnancy without intrauterine pregnancy: O00.109

## 2013-12-25 MED ORDER — IBUPROFEN 800 MG PO TABS: 800.0000 mg | ORAL_TABLET | Freq: Three times a day (TID) | ORAL | Status: DC

## 2013-12-25 MED ORDER — METHOCARBAMOL 500 MG PO TABS: 500.0000 mg | ORAL_TABLET | Freq: Two times a day (BID) | ORAL | Status: DC

## 2013-12-25 NOTE — ED Notes (Signed)
Pt sts she dropped something on her foot last summer and never was treated, sts she has had continued pain that is worsening

## 2013-12-25 NOTE — ED Provider Notes (Signed)
CSN: [id number]     Arrival date & time 12/25/13  1057 History  This chart was scribed for non-physician practitioner, Fayrene HelperBowie Tran, PA-C working with Junius ArgyleForrest S Harrison, MD by Greggory StallionKayla Andersen, ED scribe. This patient was seen in room TR10C/TR10C and the patient's care was started at 11:50 AM.    Chief Complaint  Patient presents with  . Foot Pain   The history is provided by the patient. No language interpreter was used.   HPI Comments: Nicole Landry is a 28 y.o. female who presents to the Emergency Department complaining of continued foot pain that worsened 1-2 months ago. Pain originally started last summer after dropping something on it. Pt was never evaluated after the original injury. States there is now swelling in her foot and knee. Bearing weight worsens the pain. Pt has wrapped her foot with little relief. Denies numbness.   Past Medical History  Diagnosis Date  . Gestational diabetes   . Ovarian cyst   . Ectopic pregnancy, tubal    Past Surgical History  Procedure Laterality Date  . Cesarean section    . Laparoscopy N/A 02/08/2013    Procedure: LAPAROSCOPY OPERATIVE;  Surgeon: Antionette CharLisa Jackson-Moore, MD;  Location: [facility];  Service: Gynecology;  Laterality: N/A;  . Bilateral salpingectomy Right 02/08/2013    Procedure:  SALPINGECTOMY;  Surgeon: Antionette CharLisa Jackson-Moore, MD;  Location: [facility];  Service: Gynecology;  Laterality: Right;  . Salpingectomy Right    History reviewed. No pertinent family history. History  Substance Use Topics  . Smoking status: Never Smoker   . Smokeless tobacco: Not on file  . Alcohol Use: No   OB History   Grav Para Term Preterm Abortions TAB SAB Ect Mult Living   3 1 1  2  1 1  1      Review of Systems  Musculoskeletal: Positive for arthralgias and joint swelling.  Neurological: Negative for numbness.  All other systems reviewed and are negative.  Allergies  Other and Vicodin  Home Medications   Prior to Admission medications   Medication  Sig Start Date End Date Taking? Authorizing Ritesh Opara  ferrous sulfate 325 (65 FE) MG tablet Take 1 tablet (325 mg total) by mouth 2 (two) times daily before a meal. 02/09/13   Antionette CharLisa Jackson-Moore, MD  meclizine (ANTIVERT) 32 MG tablet Take 1 tablet (32 mg total) by mouth 3 (three) times daily as needed. 08/24/13   Aviva SignsMarie L Williams, CNM  Prenatal Vit-Fe Fumarate-FA (PRENATAL MULTIVITAMIN) TABS tablet Take 1 tablet by mouth daily at 12 noon.    Historical Malyk Girouard, MD  pseudoephedrine (SUDAFED) 30 MG tablet Take 1 tablet (30 mg total) by mouth every 4 (four) hours as needed for congestion. 08/24/13   Aviva SignsMarie L Williams, CNM   BP 131/82  Pulse 82  Temp(Src) 98.2 F (36.8 C) (Oral)  Resp 18  Ht 5\' 2"  (1.575 m)  Wt 225 lb (102.059 kg)  BMI 41.14 kg/m2  SpO2 98%  LMP 12/04/2013  Physical Exam  Nursing note and vitals reviewed. Constitutional: She is oriented to person, place, and time. She appears well-developed and well-nourished. No distress.  HENT:  Head: Normocephalic and atraumatic.  Eyes: Conjunctivae and EOM are normal.  Neck: Neck supple. No tracheal deviation present.  Cardiovascular: Normal rate.   Pulmonary/Chest: Effort normal. No respiratory distress.  Musculoskeletal: Normal range of motion.  Right foot with intact dorsalis pedis pulse. Normal toe movement with normal alignment. Brisk capillary refill to all toes. Tenderness to palpation to mid dorsum  of foot. No crepitus. No obvious deformity. Ankle normal.   Neurological: She is alert and oriented to person, place, and time.  Sensation intact.   Skin: Skin is warm and dry.  Psychiatric: She has a normal mood and affect. Her behavior is normal.    ED Course  Procedures (including critical care time)  DIAGNOSTIC STUDIES: Oxygen Saturation is 98% on RA, normal by my interpretation.    COORDINATION OF CARE: 11:53 AM-Discussed treatment plan which includes an anti-inflammatory and epsom salt soaks with pt at bedside and pt  agreed to plan. Will give pt orthopedic referral and advised her to follow up if symptoms do not resolve.   Labs Review Labs Reviewed - No data to display  Imaging Review No results found.   EKG Interpretation None      MDM   Final diagnoses:  Right foot pain    BP 131/82  Pulse 82  Temp(Src) 98.2 F (36.8 C) (Oral)  Resp 18  Ht 5\' 2"  (1.575 m)  Wt 225 lb (102.059 kg)  BMI 41.14 kg/m2  SpO2 98%  LMP 12/04/2013   I personally performed the services described in this documentation, which was scribed in my presence. The recorded information has been reviewed and is accurate.  Fayrene HelperBowie Tran, PA-C 12/25/13 1208

## 2013-12-25 NOTE — ED Notes (Signed)
Dropped something on foot rt last summer and for the past month has had pain in/swelling in rt ankle and can feel pain in rt knee no new injury has been walking on it pain worse last couple of dayus

## 2013-12-25 NOTE — Discharge Instructions (Signed)
Arthritis, Nonspecific °Arthritis is inflammation of a joint. This usually means pain, redness, warmth or swelling are present. One or more joints may be involved. There are a number of types of arthritis. Your caregiver may not be able to tell what type of arthritis you have right away. °CAUSES  °The most common cause of arthritis is the wear and tear on the joint (osteoarthritis). This causes damage to the cartilage, which can break down over time. The knees, hips, back and neck are most often affected by this type of arthritis. °Other types of arthritis and common causes of joint pain include: °· Sprains and other injuries near the joint. Sometimes minor sprains and injuries cause pain and swelling that develop hours later. °· Rheumatoid arthritis. This affects hands, feet and knees. It usually affects both sides of your body at the same time. It is often associated with chronic ailments, fever, weight loss and general weakness. °· Crystal arthritis. Gout and pseudo gout can cause occasional acute severe pain, redness and swelling in the foot, ankle, or knee. °· Infectious arthritis. Bacteria can get into a joint through a break in overlying skin. This can cause infection of the joint. Bacteria and viruses can also spread through the blood and affect your joints. °· Drug, infectious and allergy reactions. Sometimes joints can become mildly painful and slightly swollen with these types of illnesses. °SYMPTOMS  °· Pain is the main symptom. °· Your joint or joints can also be red, swollen and warm or hot to the touch. °· You may have a fever with certain types of arthritis, or even feel overall ill. °· The joint with arthritis will hurt with movement. Stiffness is present with some types of arthritis. °DIAGNOSIS  °Your caregiver will suspect arthritis based on your description of your symptoms and on your exam. Testing may be needed to find the type of arthritis: °· Blood and sometimes urine tests. °· X-ray tests  and sometimes CT or MRI scans. °· Removal of fluid from the joint (arthrocentesis) is done to check for bacteria, crystals or other causes. Your caregiver (or a specialist) will numb the area over the joint with a local anesthetic, and use a needle to remove joint fluid for examination. This procedure is only minimally uncomfortable. °· Even with these tests, your caregiver may not be able to tell what kind of arthritis you have. Consultation with a specialist (rheumatologist) may be helpful. °TREATMENT  °Your caregiver will discuss with you treatment specific to your type of arthritis. If the specific type cannot be determined, then the following general recommendations may apply. °Treatment of severe joint pain includes: °· Rest. °· Elevation. °· Anti-inflammatory medication (for example, ibuprofen) may be prescribed. Avoiding activities that cause increased pain. °· Only take over-the-counter or prescription medicines for pain and discomfort as recommended by your caregiver. °· Cold packs over an inflamed joint may be used for 10 to 15 minutes every hour. Hot packs sometimes feel better, but do not use overnight. Do not use hot packs if you are diabetic without your caregiver's permission. °· A cortisone shot into arthritic joints may help reduce pain and swelling. °· Any acute arthritis that gets worse over the next 1 to 2 days needs to be looked at to be sure there is no joint infection. °Long-term arthritis treatment involves modifying activities and lifestyle to reduce joint stress jarring. This can include weight loss. Also, exercise is needed to nourish the joint cartilage and remove waste. This helps keep the muscles   around the joint strong. °HOME CARE INSTRUCTIONS  °· Do not take aspirin to relieve pain if gout is suspected. This elevates uric acid levels. °· Only take over-the-counter or prescription medicines for pain, discomfort or fever as directed by your caregiver. °· Rest the joint as much as  possible. °· If your joint is swollen, keep it elevated. °· Use crutches if the painful joint is in your leg. °· Drinking plenty of fluids may help for certain types of arthritis. °· Follow your caregiver's dietary instructions. °· Try low-impact exercise such as: °¨ Swimming. °¨ Water aerobics. °¨ Biking. °¨ Walking. °· Morning stiffness is often relieved by a warm shower. °· Put your joints through regular range-of-motion. °SEEK MEDICAL CARE IF:  °· You do not feel better in 24 hours or are getting worse. °· You have side effects to medications, or are not getting better with treatment. °SEEK IMMEDIATE MEDICAL CARE IF:  °· You have a fever. °· You develop severe joint pain, swelling or redness. °· Many joints are involved and become painful and swollen. °· There is severe back pain and/or leg weakness. °· You have loss of bowel or bladder control. °Document Released: 07/30/2004 Document Revised: 09/14/2011 Document Reviewed: 08/15/2008 °ExitCare® Patient Information ©2015 [employer]. This information is not intended to replace advice given to you by your health care provider. Make sure you discuss any questions you have with your health care provider. ° °

## 2013-12-25 NOTE — ED Provider Notes (Signed)
Medical screening examination/treatment/procedure(s) were performed by non-physician practitioner and as supervising physician I was immediately available for consultation/collaboration.   EKG Interpretation None        Junius ArgyleForrest S Harrison, MD 12/25/13 1921

## 2014-05-07 ENCOUNTER — Encounter (HOSPITAL_COMMUNITY): Payer: Self-pay | Admitting: Emergency Medicine

## 2014-07-02 ENCOUNTER — Encounter: Payer: Self-pay | Admitting: *Deleted

## 2014-08-13 DIAGNOSIS — R9389 Abnormal findings on diagnostic imaging of other specified body structures: Secondary | ICD-10-CM | POA: Insufficient documentation

## 2015-04-07 ENCOUNTER — Inpatient Hospital Stay (HOSPITAL_COMMUNITY): Payer: 59

## 2015-04-07 ENCOUNTER — Inpatient Hospital Stay (HOSPITAL_COMMUNITY)
Admission: AD | Admit: 2015-04-07 | Discharge: 2015-04-07 | Disposition: A | Discharge status: Home / Self Care | Payer: 59 | Source: Ambulatory Visit | Attending: Obstetrics and Gynecology | Admitting: Obstetrics and Gynecology

## 2015-04-07 ENCOUNTER — Encounter (HOSPITAL_COMMUNITY): Payer: Self-pay | Admitting: *Deleted

## 2015-04-07 DIAGNOSIS — N831 Corpus luteum cyst of ovary, unspecified side: Secondary | ICD-10-CM | POA: Insufficient documentation

## 2015-04-07 DIAGNOSIS — R109 Unspecified abdominal pain: Secondary | ICD-10-CM

## 2015-04-07 DIAGNOSIS — O9989 Other specified diseases and conditions complicating pregnancy, childbirth and the puerperium: Secondary | ICD-10-CM | POA: Diagnosis not present

## 2015-04-07 DIAGNOSIS — O209 Hemorrhage in early pregnancy, unspecified: Secondary | ICD-10-CM | POA: Diagnosis not present

## 2015-04-07 DIAGNOSIS — Z3A01 Less than 8 weeks gestation of pregnancy: Secondary | ICD-10-CM | POA: Insufficient documentation

## 2015-04-07 DIAGNOSIS — N939 Abnormal uterine and vaginal bleeding, unspecified: Secondary | ICD-10-CM | POA: Diagnosis present

## 2015-04-07 DIAGNOSIS — O26899 Other specified pregnancy related conditions, unspecified trimester: Secondary | ICD-10-CM

## 2015-04-07 DIAGNOSIS — Z3401 Encounter for supervision of normal first pregnancy, first trimester: Secondary | ICD-10-CM

## 2015-04-07 LAB — CBC
HCT: 33 % — ABNORMAL LOW (ref 36.0–46.0)
HEMOGLOBIN: 10.8 g/dL — AB (ref 12.0–15.0)
MCH: 27.5 pg (ref 26.0–34.0)
MCHC: 32.7 g/dL (ref 30.0–36.0)
MCV: 84 fL (ref 78.0–100.0)
PLATELETS: 362 10*3/uL (ref 150–400)
RBC: 3.93 MIL/uL (ref 3.87–5.11)
RDW: 14.6 % (ref 11.5–15.5)
WBC: 5.4 10*3/uL (ref 4.0–10.5)

## 2015-04-07 LAB — URINALYSIS, ROUTINE W REFLEX MICROSCOPIC
BILIRUBIN URINE: NEGATIVE
Glucose, UA: NEGATIVE mg/dL
Ketones, ur: NEGATIVE mg/dL
NITRITE: NEGATIVE
PROTEIN: NEGATIVE mg/dL
SPECIFIC GRAVITY, URINE: 1.015 (ref 1.005–1.030)
UROBILINOGEN UA: 0.2 mg/dL (ref 0.0–1.0)
pH: 7 (ref 5.0–8.0)

## 2015-04-07 LAB — URINE MICROSCOPIC-ADD ON

## 2015-04-07 LAB — HCG, QUANTITATIVE, PREGNANCY: HCG, BETA CHAIN, QUANT, S: 20842 m[IU]/mL — AB (ref <5)

## 2015-04-07 LAB — TYPE AND SCREEN
ABO/RH(D): O POS
ANTIBODY SCREEN: NEGATIVE

## 2015-04-07 LAB — POCT PREGNANCY, URINE: PREG TEST UR: POSITIVE — AB

## 2015-04-07 NOTE — MAU Note (Signed)
Pt report +UPT at home, having brownish spotting this morning.  Denies pain.  History of ectopic pregnancy 2 years ago.

## 2015-04-07 NOTE — Discharge Instructions (Signed)
Abdominal Pain During Pregnancy °Abdominal pain is common in pregnancy. Most of the time, it does not cause harm. There are many causes of abdominal pain. Some causes are more serious than others. Some of the causes of abdominal pain in pregnancy are easily diagnosed. Occasionally, the diagnosis takes time to understand. Other times, the cause is not determined. Abdominal pain can be a sign that something is very wrong with the pregnancy, or the pain may have nothing to do with the pregnancy at all. For this reason, always tell your health care provider if you have any abdominal discomfort. °HOME CARE INSTRUCTIONS  °Monitor your abdominal pain for any changes. The following actions may help to alleviate any discomfort you are experiencing: °· Do not have sexual intercourse or put anything in your vagina until your symptoms go away completely. °· Get plenty of rest until your pain improves. °· Drink clear fluids if you feel nauseous. Avoid solid food as long as you are uncomfortable or nauseous. °· Only take over-the-counter or prescription medicine as directed by your health care provider. °· Keep all follow-up appointments with your health care provider. °SEEK IMMEDIATE MEDICAL CARE IF: °· You are bleeding, leaking fluid, or passing tissue from the vagina. °· You have increasing pain or cramping. °· You have persistent vomiting. °· You have painful or bloody urination. °· You have a fever. °· You notice a decrease in your baby's movements. °· You have extreme weakness or feel faint. °· You have shortness of breath, with or without abdominal pain. °· You develop a severe headache with abdominal pain. °· You have abnormal vaginal discharge with abdominal pain. °· You have persistent diarrhea. °· You have abdominal pain that continues even after rest, or gets worse. °MAKE SURE YOU:  °· Understand these instructions. °· Will watch your condition. °· Will get help right away if you are not doing well or get  worse. °Document Released: 06/22/2005 Document Revised: 04/12/2013 Document Reviewed: 01/19/2013 °Exit[employer]® Patient Information ©2015 [employer]. This information is not intended to replace advice given to you by your health care provider. Make sure you discuss any questions you have with your health care provider. °First Trimester of Pregnancy °The first trimester of pregnancy is from week 1 until the end of week 12 (months 1 through 3). A week after a sperm fertilizes an egg, the egg will implant on the wall of the uterus. This embryo will begin to develop into a baby. Genes from you and your partner are forming the baby. The female genes determine whether the baby is a boy or a girl. At 6-8 weeks, the eyes and face are formed, and the heartbeat can be seen on ultrasound. At the end of 12 weeks, all the baby's organs are formed.  °Now that you are pregnant, you will want to do everything you can to have a healthy baby. Two of the most important things are to get good prenatal care and to follow your health care provider's instructions. Prenatal care is all the medical care you receive before the baby's birth. This care will help prevent, find, and treat any problems during the pregnancy and childbirth. °BODY CHANGES °Your body goes through many changes during pregnancy. The changes vary from woman to woman.  °· You may gain or lose a couple of pounds at first. °· You may feel sick to your stomach (nauseous) and throw up (vomit). If the vomiting is uncontrollable, call your health care provider. °· You may tire easily. °·   You may develop headaches that can be relieved by medicines approved by your health care provider. °· You may urinate more often. Painful urination may mean you have a bladder infection. °· You may develop heartburn as a result of your pregnancy. °· You may develop constipation because certain hormones are causing the muscles that push waste through your intestines to slow down. °· You may  develop hemorrhoids or swollen, bulging veins (varicose veins). °· Your breasts may begin to grow larger and become tender. Your nipples may stick out more, and the tissue that surrounds them (areola) may become darker. °· Your gums may bleed and may be sensitive to brushing and flossing. °· Dark spots or blotches (chloasma, mask of pregnancy) may develop on your face. This will likely fade after the baby is born. °· Your menstrual periods will stop. °· You may have a loss of appetite. °· You may develop cravings for certain kinds of food. °· You may have changes in your emotions from day to day, such as being excited to be pregnant or being concerned that something may go wrong with the pregnancy and baby. °· You may have more vivid and strange dreams. °· You may have changes in your hair. These can include thickening of your hair, rapid growth, and changes in texture. Some women also have hair loss during or after pregnancy, or hair that feels dry or thin. Your hair will most likely return to normal after your baby is born. °WHAT TO EXPECT AT YOUR PRENATAL VISITS °During a routine prenatal visit: °· You will be weighed to make sure you and the baby are growing normally. °· Your blood pressure will be taken. °· Your abdomen will be measured to track your baby's growth. °· The fetal heartbeat will be listened to starting around week 10 or 12 of your pregnancy. °· Test results from any previous visits will be discussed. °Your health care provider may ask you: °· How you are feeling. °· If you are feeling the baby move. °· If you have had any abnormal symptoms, such as leaking fluid, bleeding, severe headaches, or abdominal cramping. °· If you have any questions. °Other tests that may be performed during your first trimester include: °· Blood tests to find your blood type and to check for the presence of any previous infections. They will also be used to check for low iron levels (anemia) and Rh antibodies. Later in  the pregnancy, blood tests for diabetes will be done along with other tests if problems develop. °· Urine tests to check for infections, diabetes, or protein in the urine. °· An ultrasound to confirm the proper growth and development of the baby. °· An amniocentesis to check for possible genetic problems. °· Fetal screens for spina bifida and Down syndrome. °· You may need other tests to make sure you and the baby are doing well. °HOME CARE INSTRUCTIONS  °Medicines °· Follow your health care provider's instructions regarding medicine use. Specific medicines may be either safe or unsafe to take during pregnancy. °· Take your prenatal vitamins as directed. °· If you develop constipation, try taking a stool softener if your health care provider approves. °Diet °· Eat regular, well-balanced meals. Choose a variety of foods, such as meat or vegetable-based protein, fish, milk and low-fat dairy products, vegetables, fruits, and whole grain breads and cereals. Your health care provider will help you determine the amount of weight gain that is right for you. °· Avoid raw meat and uncooked cheese. These   carry germs that can cause birth defects in the baby. °· Eating four or five small meals rather than three large meals a day may help relieve nausea and vomiting. If you start to feel nauseous, eating a few soda crackers can be helpful. Drinking liquids between meals instead of during meals also seems to help nausea and vomiting. °· If you develop constipation, eat more high-fiber foods, such as fresh vegetables or fruit and whole grains. Drink enough fluids to keep your urine clear or pale yellow. °Activity and Exercise °· Exercise only as directed by your health care provider. Exercising will help you: °¨ Control your weight. °¨ Stay in shape. °¨ Be prepared for labor and delivery. °· Experiencing pain or cramping in the lower abdomen or low back is a good sign that you should stop exercising. Check with your health care  provider before continuing normal exercises. °· Try to avoid standing for long periods of time. Move your legs often if you must stand in one place for a long time. °· Avoid heavy lifting. °· Wear low-heeled shoes, and practice good posture. °· You may continue to have sex unless your health care provider directs you otherwise. °Relief of Pain or Discomfort °· Wear a good support bra for breast tenderness.   °· Take warm sitz baths to soothe any pain or discomfort caused by hemorrhoids. Use hemorrhoid cream if your health care provider approves.   °· Rest with your legs elevated if you have leg cramps or low back pain. °· If you develop varicose veins in your legs, wear support hose. Elevate your feet for 15 minutes, 3-4 times a day. Limit salt in your diet. °Prenatal Care °· Schedule your prenatal visits by the twelfth week of pregnancy. They are usually scheduled monthly at first, then more often in the last 2 months before delivery. °· Write down your questions. Take them to your prenatal visits. °· Keep all your prenatal visits as directed by your health care provider. °Safety °· Wear your seat belt at all times when driving. °· Make a list of emergency phone numbers, including numbers for family, friends, the hospital, and police and fire departments. °General Tips °· Ask your health care provider for a referral to a local prenatal education class. Begin classes no later than at the beginning of month 6 of your pregnancy. °· Ask for help if you have counseling or nutritional needs during pregnancy. Your health care provider can offer advice or refer you to specialists for help with various needs. °· Do not use hot tubs, steam rooms, or saunas. °· Do not douche or use tampons or scented sanitary pads. °· Do not cross your legs for long periods of time. °· Avoid cat litter boxes and soil used by cats. These carry germs that can cause birth defects in the baby and possibly loss of the fetus by miscarriage or  stillbirth. °· Avoid all smoking, herbs, alcohol, and medicines not prescribed by your health care provider. Chemicals in these affect the formation and growth of the baby. °· Schedule a dentist appointment. At home, brush your teeth with a soft toothbrush and be gentle when you floss. °SEEK MEDICAL CARE IF:  °· You have dizziness. °· You have mild pelvic cramps, pelvic pressure, or nagging pain in the abdominal area. °· You have persistent nausea, vomiting, or diarrhea. °· You have a bad smelling vaginal discharge. °· You have pain with urination. °· You notice increased swelling in your face, hands, legs, or ankles. °SEEK   IMMEDIATE MEDICAL CARE IF:  °· You have a fever. °· You are leaking fluid from your vagina. °· You have spotting or bleeding from your vagina. °· You have severe abdominal cramping or pain. °· You have rapid weight gain or loss. °· You vomit blood or material that looks like coffee grounds. °· You are exposed to German measles and have never had them. °· You are exposed to fifth disease or chickenpox. °· You develop a severe headache. °· You have shortness of breath. °· You have any kind of trauma, such as from a fall or a car accident. °Document Released: 06/16/2001 Document Revised: 11/06/2013 Document Reviewed: 05/02/2013 °[employer]® Patient Information ©2015 [employer]. This information is not intended to replace advice given to you by your health care provider. Make sure you discuss any questions you have with your health care provider. ° °

## 2015-04-07 NOTE — MAU Provider Note (Signed)
History    CSN: [id number] Arrival date and time: 04/07/15 9147 First Provider Initiated Contact with Patient 04/07/15 [phone]      Chief Complaint  Patient presents with  . Vaginal Bleeding   HPI Patient is 29 y.o. [id number] [redacted]w[redacted]d here with complaints of vaginal spotting. Denies vaginal discharge, itching. Reports back pain in the last week but this is currently gone. No dysuria reports increased frequency.   + brown spotting today No intercourse in the last 24 hours.   OB History    Gravida Para Term Preterm AB TAB SAB Ectopic Multiple Living   Past Medical History  Diagnosis Date  . Gestational diabetes   . Ovarian cyst   . Ectopic pregnancy, tubal     Past Surgical History  Procedure Laterality Date  . Cesarean section    . Laparoscopy N/A 02/08/2013    Procedure: LAPAROSCOPY OPERATIVE;  Surgeon: Antionette Char, MD;  Location: [facility];  Service: Gynecology;  Laterality: N/A;  . Bilateral salpingectomy Right 02/08/2013    Procedure:  SALPINGECTOMY;  Surgeon: Antionette Char, MD;  Location: [facility];  Service: Gynecology;  Laterality: Right;  . Salpingectomy Right     No family history on file.  Social History  Substance Use Topics  . Smoking status: Never Smoker   . Smokeless tobacco: Not on file  . Alcohol Use: No    Allergies:  Allergies  Allergen Reactions  . 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  . Vicodin [Hydrocodone-Acetaminophen] Other (See Comments)    Pt reports seizures    Prescriptions prior to admission  Medication Sig Dispense Refill Last Dose  . ferrous sulfate 325 (65 FE) MG tablet Take 1 tablet (325 mg total) by mouth 2 (two) times daily before a meal. 60 tablet 11 Past Week at Unknown time  . ibuprofen (ADVIL,MOTRIN) 800 MG tablet Take 1 tablet (800 mg total) by mouth 3 (three) times daily. 21 tablet 0   . meclizine (ANTIVERT) 32 MG tablet Take 1 tablet (32 mg total) by mouth 3 (three)  times daily as needed. 30 tablet 0   . methocarbamol (ROBAXIN) 500 MG tablet Take 1 tablet (500 mg total) by mouth 2 (two) times daily. 20 tablet 0   . Prenatal Vit-Fe Fumarate-FA (PRENATAL MULTIVITAMIN) TABS tablet Take 1 tablet by mouth daily at 12 noon.   Past Week at Unknown time  . pseudoephedrine (SUDAFED) 30 MG tablet Take 1 tablet (30 mg total) by mouth every 4 (four) hours as needed for congestion. 30 tablet 0     Review of Systems  Constitutional: Negative for fever and chills.  Eyes: Negative for blurred vision and double vision.  Respiratory: Negative for cough and shortness of breath.   Cardiovascular: Negative for chest pain and orthopnea.  Gastrointestinal: Negative for nausea and vomiting.  Genitourinary: Negative for dysuria, frequency and flank pain.  Musculoskeletal: Negative for myalgias.  Skin: Negative for rash.  Neurological: Negative for dizziness, tingling, weakness and headaches.  Endo/Heme/Allergies: Does not bruise/bleed easily.  Psychiatric/Behavioral: Negative for depression and suicidal ideas. The patient is not nervous/anxious.    Physical Exam   Blood pressure 134/85, pulse 105, temperature 98.2 F (36.8 C), resp. rate 16, height  (1.6 m), weight 224 lb 9.6 oz (101.878 kg), last menstrual period 02/21/2015.  Physical Exam  Nursing note and vitals reviewed. Constitutional: She is oriented to  person, place, and time. She appears well-developed and well-nourished. No distress.  Well appearing, conversant  HENT:  Head: Normocephalic and atraumatic.  Eyes: Conjunctivae are normal. No scleral icterus.  Neck: Normal range of motion. Neck supple.  Cardiovascular: Normal rate and intact distal pulses.   Respiratory: Effort normal. She exhibits no tenderness.  GI: Soft. She exhibits no distension. There is no tenderness. There is no rebound and no guarding.  Musculoskeletal: Normal range of motion. She exhibits no edema.  Neurological: She is alert and  oriented to person, place, and time.  Skin: Skin is warm and dry. No rash noted.  Psychiatric: She has a normal mood and affect.    MAU Course  Procedures  MDM Type and Screen O POS CBC: Hgb 10.8 (baseline) UA- Mod LE, Large amt of glucose  Lab Results  Component Value Date   HCGBETAQNT [id number]* 04/07/2015   OB US IMPRESSION: 1. Intrauterine pregnancy of 6 weeks 1 day with fetal heart rate of 118 beats per minute. 2. Left ovarian corpus luteal cyst.  Assessment and Plan  Patient with threatened AB with confirmed IUP with appropriate HCG level. Return precautions reviewed Recommended starting [facility] ASAP  Isa Rankin [facility] 04/07/2015, 9:48 AM

## 2015-05-14 LAB — OB RESULTS CONSOLE RPR: RPR: NONREACTIVE

## 2015-05-14 LAB — OB RESULTS CONSOLE GC/CHLAMYDIA
CHLAMYDIA, DNA PROBE: NEGATIVE
Gonorrhea: NEGATIVE

## 2015-05-14 LAB — OB RESULTS CONSOLE ABO/RH: RH TYPE: POSITIVE

## 2015-05-14 LAB — OB RESULTS CONSOLE HEPATITIS B SURFACE ANTIGEN: HEP B S AG: NEGATIVE

## 2015-05-14 LAB — OB RESULTS CONSOLE HIV ANTIBODY (ROUTINE TESTING): HIV: NONREACTIVE

## 2015-05-14 LAB — OB RESULTS CONSOLE ANTIBODY SCREEN: ANTIBODY SCREEN: NEGATIVE

## 2015-05-14 LAB — OB RESULTS CONSOLE RUBELLA ANTIBODY, IGM: Rubella: IMMUNE

## 2015-07-07 NOTE — L&D Delivery Note (Signed)
Operative Delivery Note At 2:00 AM a viable and healthy unspecified sex was delivered via VBAC, Vacuum Assisted.  Presentation: vertex; Position: Left,, Occiput,, Posterior; Station: +2.  Verbal consent: obtained from patient.  Risks and benefits discussed in detail.  Risks include, but are not limited to the risks of anesthesia, bleeding, infection, damage to maternal tissues, fetal cephalhematoma.  There is also the risk of inability to effect vaginal delivery of the head, or shoulder dystocia that cannot be resolved by established maneuvers, leading to the need for emergency cesarean section.  APGAR: 8, 9; weight 8 lb 2.2 oz (3690 g).   Placenta status: Intact, Spontaneous Pathology.   Cord: 3 vessels with the following complications: None.  Cord pH: 7.18  Anesthesia: Local Epidural  Instruments: mushroom Episiotomy: None Lacerations: 3rd degree;Perineal(partial) Suture Repair: 3.0 chromic O vicryl Est. Blood Loss (mL): 350  Mom to postpartum.  Baby to Couplet care / Skin to Skin.  Nicole Landry A 11/20/2015, 4:31 AM

## 2015-07-09 ENCOUNTER — Ambulatory Visit (INDEPENDENT_AMBULATORY_CARE_PROVIDER_SITE_OTHER): Payer: 59 | Admitting: Family Medicine

## 2015-07-09 VITALS — BP 106/70 | HR 90 | Temp 98.6°F | Resp 16 | Ht 62.0 in | Wt 231.0 lb

## 2015-07-09 DIAGNOSIS — Z0000 Encounter for general adult medical examination without abnormal findings: Secondary | ICD-10-CM

## 2015-07-09 DIAGNOSIS — Z111 Encounter for screening for respiratory tuberculosis: Secondary | ICD-10-CM | POA: Diagnosis not present

## 2015-07-09 NOTE — Patient Instructions (Signed)
We hope that the rest of your pregnancy goes smoothly!  Take care and let me know if you need any other documentation of your physical exam

## 2015-07-09 NOTE — Progress Notes (Signed)
  Tuberculosis Risk Questionnaire  1. No Were you born outside the [place] in one of the following parts of the world: [place], [place], [place], [place] or [place]?    2. No Have you traveled outside the [place] and lived for more than one month in one of the following parts of the world: [place], [place], [place], [place] or [place]?    3. No Do you have a compromised immune system such as from any of the following conditions:HIV/AIDS, organ or bone marrow transplantation, diabetes, immunosuppressive medicines (e.g. Prednisone, Remicaide), leukemia, lymphoma, cancer of the head or neck, gastrectomy or jejunal bypass, end-stage renal disease (on dialysis), or silicosis?     4. 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    5. No Have you ever: injected illegal drugs, used crack cocaine, lived in a homeless shelter  or been in jail or prison?     6. No Have you ever been exposed to anyone with infectious tuberculosis?    Tuberculosis Symptom Questionnaire  Do you currently have any of the following symptoms?  1. No Unexplained cough lasting more than 3 weeks?   2. No Unexplained fever lasting more than 3 weeks.   3. No Night Sweats (sweating that leaves the bedclothes and sheets wet)     4. No Shortness of Breath   5. No Chest Pain   6. No Unintentional weight loss    7. No Unexplained fatigue (very tired for no reason)

## 2015-07-09 NOTE — Progress Notes (Signed)
[facility] [address], [place] [address] [phone]  Date:  07/09/2015   Name:  Nicole Landry   DOB:  1986/02/08   MRN:  [id number]  PCP:  Roseanna RainbowJACKSON-MOORE,LISA A, MD    Chief Complaint: Annual Exam   History of Present Illness:  Nicole Landry is a 30 y.o. very pleasant female patient who presents with the following:  Here today as a new patient seeking a CPE and TB testing for a job.  She is generally healthy excepet she did have a ruptured ectopic pregnancy in 2014 Never a smoker. 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    She has never  She does not need a pap, etc today She had her flu shot already this fall per her OBG and she will plan to have the Tdap later in her pregnancy  Patient Active Problem List   Diagnosis Date Noted  . Acute blood loss anemia 02/16/2013  . Ruptured ectopic pregnancy 02/08/2013    Past Medical History  Diagnosis Date  . Gestational diabetes   . Ovarian cyst   . Ectopic pregnancy, tubal     Past Surgical History  Procedure Laterality Date  . Cesarean section    . Laparoscopy N/A 02/08/2013    Procedure: LAPAROSCOPY OPERATIVE;  Surgeon: Antionette CharLisa Jackson-Moore, MD;  Location: [facility];  Service: Gynecology;  Laterality: N/A;  . Bilateral salpingectomy Right 02/08/2013    Procedure:  SALPINGECTOMY;  Surgeon: Antionette CharLisa Jackson-Moore, MD;  Location: [facility];  Service: Gynecology;  Laterality: Right;  . Salpingectomy Right     Social History  Substance Use Topics  . Smoking status: Never Smoker   . Smokeless tobacco: Not on file  . Alcohol Use: No    No family history on file.  Allergies  Allergen Reactions  . 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 cantaloupe  . Vicodin [Hydrocodone-Acetaminophen]  Other (See Comments)    Pt reports seizures    Medication list has been reviewed and updated.  Current Outpatient Prescriptions on File Prior to Visit  Medication Sig Dispense Refill  . Prenatal Vit-Fe Fumarate-FA (PRENATAL MULTIVITAMIN) TABS tablet Take 1 tablet by mouth daily at 12 noon.     No current facility-administered medications on file prior to visit.    Review of Systems:  As per HPI- otherwise negative.   Physical Examination: Filed Vitals:   07/09/15 0819  BP: 127/80  Pulse: 102  Temp: 98.6 F (37 C)  Resp: 16   Filed Vitals:   07/09/15 0819  Height: 5\' 2"  (1.575 m)  Weight: 231 lb (104.781 kg)   Body mass index is 42.24 kg/(m^2). Ideal Body Weight: Weight in (lb) to have BMI = 25: 136.4  GEN: WDWN, NAD, Non-toxic, A & O x 3, pregnant, looks well HEENT: Atraumatic, Normocephalic. Neck supple. No masses, No LAD.  Bilateral TM wnl, oropharynx normal.  PEERL,EOMI.   Ears and Nose: No external deformity. CV: RRR, No M/G/R. No JVD. No thrill. No extra heart sounds. PULM: CTA B, no wheezes, crackles, rhonchi. No retractions. No resp. distress. No accessory muscle use. ABD: S, NT, ND, +BS. No rebound. No HSM. EXTR: No c/c/e NEURO Normal gait.  PSYCH: Normally interactive. Conversant. Not depressed or anxious appearing.  Calm demeanor.   Completed a brief PE form for her today Assessment and Plan: Physical exam  Screening for tuberculosis - Plan: TB Skin Test  Physical exam for job- she does not have a form to fill out but she will let me know if other documentation is needed.  Place PPD today- this is valid and safe during pregnancy according to the CDC  Signed Abbe Amsterdam, MD

## 2015-07-11 ENCOUNTER — Ambulatory Visit: Payer: 59

## 2015-07-11 DIAGNOSIS — Z111 Encounter for screening for respiratory tuberculosis: Secondary | ICD-10-CM

## 2015-07-11 LAB — TB SKIN TEST
Induration: 0 mm
TB SKIN TEST: NEGATIVE

## 2015-07-31 DIAGNOSIS — Z36 Encounter for antenatal screening of mother: Secondary | ICD-10-CM | POA: Diagnosis not present

## 2015-08-05 MED FILL — PRENAISSANCE PLUS SOFTGEL: 28-1-250 | 90 days supply | Qty: 90 | Fill #0

## 2015-09-09 DIAGNOSIS — Z36 Encounter for antenatal screening of mother: Secondary | ICD-10-CM | POA: Diagnosis not present

## 2015-09-16 DIAGNOSIS — Z3A29 29 weeks gestation of pregnancy: Secondary | ICD-10-CM | POA: Diagnosis not present

## 2015-09-16 DIAGNOSIS — O99810 Abnormal glucose complicating pregnancy: Secondary | ICD-10-CM | POA: Diagnosis not present

## 2015-09-18 ENCOUNTER — Encounter (HOSPITAL_COMMUNITY): Payer: Self-pay | Admitting: *Deleted

## 2015-09-18 ENCOUNTER — Inpatient Hospital Stay (HOSPITAL_COMMUNITY)
Admission: AD | Admit: 2015-09-18 | Discharge: 2015-09-18 | Disposition: A | Discharge status: Home / Self Care | Payer: 59 | Source: Ambulatory Visit | Attending: Obstetrics & Gynecology | Admitting: Obstetrics & Gynecology

## 2015-09-18 DIAGNOSIS — O26893 Other specified pregnancy related conditions, third trimester: Secondary | ICD-10-CM | POA: Diagnosis not present

## 2015-09-18 DIAGNOSIS — Z043 Encounter for examination and observation following other accident: Secondary | ICD-10-CM | POA: Diagnosis not present

## 2015-09-18 DIAGNOSIS — W501XXA Accidental kick by another person, initial encounter: Secondary | ICD-10-CM | POA: Diagnosis not present

## 2015-09-18 DIAGNOSIS — Z3A30 30 weeks gestation of pregnancy: Secondary | ICD-10-CM | POA: Diagnosis not present

## 2015-09-18 NOTE — MAU Note (Signed)
Kicked in the abd at 1540.  Just started feeling the baby move.  No bleeding or leaking.  At first felt cramping in abd, now it is low back

## 2015-09-18 NOTE — MAU Note (Signed)
Urine sent to lab 

## 2015-09-18 NOTE — Discharge Instructions (Signed)
Please keep follow up appointments.  Call your office with any further questions/concerns.

## 2015-09-20 DIAGNOSIS — Z043 Encounter for examination and observation following other accident: Secondary | ICD-10-CM | POA: Diagnosis not present

## 2015-09-23 DIAGNOSIS — Z23 Encounter for immunization: Secondary | ICD-10-CM | POA: Diagnosis not present

## 2015-09-25 ENCOUNTER — Ambulatory Visit: Payer: 59

## 2015-10-03 DIAGNOSIS — W501XXA Accidental kick by another person, initial encounter: Secondary | ICD-10-CM | POA: Diagnosis present

## 2015-10-07 DIAGNOSIS — O24410 Gestational diabetes mellitus in pregnancy, diet controlled: Secondary | ICD-10-CM | POA: Diagnosis not present

## 2015-10-07 DIAGNOSIS — O3663X0 Maternal care for excessive fetal growth, third trimester, not applicable or unspecified: Secondary | ICD-10-CM | POA: Diagnosis not present

## 2015-10-07 DIAGNOSIS — O99213 Obesity complicating pregnancy, third trimester: Secondary | ICD-10-CM | POA: Diagnosis not present

## 2015-10-07 DIAGNOSIS — Z3A32 32 weeks gestation of pregnancy: Secondary | ICD-10-CM | POA: Diagnosis not present

## 2015-10-10 MED FILL — AMOX-CLAV 875-125 MG TABLET: 875-125 | 7 days supply | Qty: 14 | Fill #0

## 2015-10-10 MED FILL — OSELTAMIVIR PHOS 75 MG CAP: 75 | 5 days supply | Qty: 10 | Fill #0

## 2015-11-04 DIAGNOSIS — O99213 Obesity complicating pregnancy, third trimester: Secondary | ICD-10-CM | POA: Diagnosis not present

## 2015-11-04 DIAGNOSIS — Z36 Encounter for antenatal screening of mother: Secondary | ICD-10-CM | POA: Diagnosis not present

## 2015-11-04 DIAGNOSIS — O3663X0 Maternal care for excessive fetal growth, third trimester, not applicable or unspecified: Secondary | ICD-10-CM | POA: Diagnosis not present

## 2015-11-04 DIAGNOSIS — O24410 Gestational diabetes mellitus in pregnancy, diet controlled: Secondary | ICD-10-CM | POA: Diagnosis not present

## 2015-11-04 DIAGNOSIS — Z3A36 36 weeks gestation of pregnancy: Secondary | ICD-10-CM | POA: Diagnosis not present

## 2015-11-04 LAB — OB RESULTS CONSOLE GBS: GBS: POSITIVE

## 2015-11-14 ENCOUNTER — Inpatient Hospital Stay (HOSPITAL_COMMUNITY): Payer: 59

## 2015-11-14 ENCOUNTER — Inpatient Hospital Stay (HOSPITAL_COMMUNITY)
Admission: AD | Admit: 2015-11-14 | Discharge: 2015-11-14 | Disposition: A | Discharge status: Home / Self Care | Payer: 59 | Source: Ambulatory Visit | Attending: Obstetrics and Gynecology | Admitting: Obstetrics and Gynecology

## 2015-11-14 ENCOUNTER — Encounter (HOSPITAL_COMMUNITY): Payer: Self-pay | Admitting: *Deleted

## 2015-11-14 DIAGNOSIS — Z3A38 38 weeks gestation of pregnancy: Secondary | ICD-10-CM | POA: Diagnosis not present

## 2015-11-14 DIAGNOSIS — O368130 Decreased fetal movements, third trimester, not applicable or unspecified: Secondary | ICD-10-CM | POA: Insufficient documentation

## 2015-11-14 DIAGNOSIS — O368190 Decreased fetal movements, unspecified trimester, not applicable or unspecified: Secondary | ICD-10-CM

## 2015-11-14 DIAGNOSIS — Z885 Allergy status to narcotic agent status: Secondary | ICD-10-CM | POA: Insufficient documentation

## 2015-11-14 LAB — URINALYSIS, ROUTINE W REFLEX MICROSCOPIC
Bilirubin Urine: NEGATIVE
GLUCOSE, UA: NEGATIVE mg/dL
Ketones, ur: NEGATIVE mg/dL
Nitrite: NEGATIVE
PROTEIN: NEGATIVE mg/dL
Specific Gravity, Urine: 1.02 (ref 1.005–1.030)
pH: 6 (ref 5.0–8.0)

## 2015-11-14 LAB — URINE MICROSCOPIC-ADD ON

## 2015-11-14 NOTE — MAU Provider Note (Signed)
Chief Complaint:  Decreased Fetal Movement   First Provider Initiated Contact with Patient 11/14/15 [phone]      HPI: Nicole Landry is a 30 y.o. [id number] at [redacted]w[redacted]d who presents to maternity admissions reporting decreased fetal movement x 2 weeks, especially last 24 hours. She reports she saw her doctor in the office yesterday and her cervix was closed and she was told to come to MAU if she continued to have decreased movement. She did not feel any movement during the night last night or early this morning. She tried drinking cold water, lying down, and pushing on her abdomen to get the baby to move and she did get some movement after these efforts. She does report movement now in MAU.  She reports occasional mild contractions felt in her upper/mid abdomen but none are regular or painful.   She denies LOF, vaginal bleeding, vaginal itching/burning, urinary symptoms, h/a, dizziness, n/v, or fever/chills.    HPI  Past Medical History: Past Medical History  Diagnosis Date  . Gestational diabetes   . Ovarian cyst   . Ectopic pregnancy, tubal     Past obstetric history: OB History  Gravida Para Term Preterm AB SAB TAB Ectopic Multiple Living  # Outcome Date GA Lbr Len/2nd Weight Sex Delivery Anes PTL Lv  4 Current           3 Ectopic 2014             Comments: System Generated. Please review and update pregnancy details.  2 SAB           1 Term               Past Surgical History: Past Surgical History  Procedure Laterality Date  . Cesarean section    . Laparoscopy N/A 02/08/2013    Procedure: LAPAROSCOPY OPERATIVE;  Surgeon: Antionette Char, MD;  Location: [facility];  Service: Gynecology;  Laterality: N/A;  . Bilateral salpingectomy Right 02/08/2013    Procedure:  SALPINGECTOMY;  Surgeon: Antionette Char, MD;  Location: [facility];  Service: Gynecology;  Laterality: Right;  . Salpingectomy Right   . Right salpingectomy      Family History: History reviewed. No  pertinent family history.  Social History: Social History  Substance Use Topics  . Smoking status: Never Smoker   . Smokeless tobacco: None  . Alcohol Use: No    Allergies:  Allergies  Allergen Reactions  . 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 cantaloupe  . Vicodin [Hydrocodone-Acetaminophen] Other (See Comments)    Pt reports seizures    Meds:  No prescriptions prior to admission    ROS:  Review of Systems  Constitutional: Negative for fever, chills and fatigue.  Eyes: Negative for visual disturbance.  Respiratory: Negative for shortness of breath.   Cardiovascular: Negative for chest pain.  Gastrointestinal: Negative for nausea, vomiting and abdominal pain.  Genitourinary: Negative for dysuria, flank pain, vaginal bleeding, vaginal discharge, difficulty urinating, vaginal pain and pelvic pain.  Neurological: Negative for dizziness and headaches.  Psychiatric/Behavioral: Negative.      I have reviewed patient's Past Medical Hx, Surgical Hx, Family Hx, Social Hx, medications and allergies.   Physical Exam   Patient Vitals for the past 24 hrs:  BP Temp Temp src Pulse Resp SpO2  11/14/15 1044 139/81 mmHg 98.1 F (36.7 C) Oral 91 18 -  11/14/15 0828 135/74  mmHg 98.3 F (36.8 C) Oral 90 18 98 %   Constitutional: Well-developed, well-nourished female in no acute distress.  Cardiovascular: normal rate Respiratory: normal effort GI: Abd soft, non-tender, gravid appropriate for gestational age.  MS: Extremities nontender, no edema, normal ROM Neurologic: Alert and oriented x 4.  GU: Neg CVAT.  PELVIC EXAM: Cervix pink, visually closed, without lesion, scant white creamy discharge, vaginal walls and external genitalia normal Bimanual exam: Cervix 0/long/high, firm, anterior, neg CMT, uterus nontender, nonenlarged, adnexa without tenderness, enlargement, or mass     FHT:  Baseline 145, moderate variability, accelerations present,  deceleration x 1 lasting 1.5 minutes down to 80 Contractions: None on toco or to palpation   Labs: Results for orders placed or performed during the hospital encounter of 11/14/15 (from the past 24 hour(s))  Urinalysis, Routine w reflex microscopic (not at [facility])     Status: Abnormal   Collection Time: 11/14/15  8:10 AM  Result Value Ref Range   Color, Urine YELLOW YELLOW   APPearance CLOUDY (A) CLEAR   Specific Gravity, Urine 1.020 1.005 - 1.030   pH 6.0 5.0 - 8.0   Glucose, UA NEGATIVE NEGATIVE mg/dL   Hgb urine dipstick SMALL (A) NEGATIVE   Bilirubin Urine NEGATIVE NEGATIVE   Ketones, ur NEGATIVE NEGATIVE mg/dL   Protein, ur NEGATIVE NEGATIVE mg/dL   Nitrite NEGATIVE NEGATIVE   Leukocytes, UA LARGE (A) NEGATIVE  Urine microscopic-add on     Status: Abnormal   Collection Time: 11/14/15  8:10 AM  Result Value Ref Range   Squamous Epithelial / LPF 6-30 (A) NONE SEEN   WBC, UA 6-30 0 - 5 WBC/hpf   RBC / HPF 0-5 0 - 5 RBC/hpf   Bacteria, UA FEW (A) NONE SEEN   --/--/O POS (10/02 [id number])  Imaging:  No results found.  MAU Course/MDM: I have ordered labs and US and reviewed results.  BPP 8/8.  FHR tracing overall reactive with decel x 1.  Consult Dr Billy Coastaavon.  Reassurance provided to pt.  Reviewed fetal movement counting with pt/reasons to come to MAU.  Labor precautions given. Pt stable at time of discharge.  Assessment: 1. Decreased fetal movement   2. [redacted] weeks gestation of pregnancy     Plan: Discharge home Labor precautions and fetal kick counts Pt to F/U with Dr Juliene PinaMody next week as scheduled      Follow-up Information    Follow up with Lenoard AdenAAVON,RICHARD J, MD.   Specialty:  Obstetrics and Gynecology   Why:  As scheduled, Return to MAU as needed for emergencies   Contact information:   [address] [place] [address] [phone]        Medication List    TAKE these medications        ferrous sulfate 325 (65 FE) MG tablet  Take 325 mg by mouth 2 (two) times  daily with a meal.     prenatal multivitamin Tabs tablet  Take 1 tablet by mouth daily at 12 noon.        Sharen CounterLisa Leftwich-Kirby Certified Nurse-Midwife 11/14/2015 11:04 AM

## 2015-11-14 NOTE — Discharge Instructions (Signed)
Reasons to return to MAU: ° °1.  Contractions are  5 minutes apart or less, each last 1 minute, these have been going on for 1-2 hours, and you cannot walk or talk during them °2.  You have a large gush of fluid, or a trickle of fluid that will not stop and you have to wear a pad °3.  You have bleeding that is bright red, heavier than spotting--like menstrual bleeding (spotting can be normal in early labor or after a check of your cervix) °4.  You do not feel the baby moving like he/she normally does ° ° °Fetal Movement Counts °Patient Name: __________________________________________________ Patient Due Date: ____________________ °Performing a fetal movement count is highly recommended in high-risk pregnancies, but it is good for every pregnant woman to do. Your health care provider may ask you to start counting fetal movements at 28 weeks of the pregnancy. Fetal movements often increase: °· After eating a full meal. °· After physical activity. °· After eating or drinking something sweet or cold. °· At rest. °Pay attention to when you feel the baby is most active. This will help you notice a pattern of your baby's sleep and wake cycles and what factors contribute to an increase in fetal movement. It is important to perform a fetal movement count at the same time each day when your baby is normally most active.  °HOW TO COUNT FETAL MOVEMENTS °1. Find a quiet and comfortable area to sit or lie down on your left side. Lying on your left side provides the best blood and oxygen circulation to your baby. °2. Write down the day and time on a sheet of paper or in a journal. °3. Start counting kicks, flutters, swishes, rolls, or jabs in a 2-hour period. You should feel at least 10 movements within 2 hours. °4. If you do not feel 10 movements in 2 hours, wait 2-3 hours and count again. Look for a change in the pattern or not enough counts in 2 hours. °SEEK MEDICAL CARE IF: °· You feel less than 10 counts in 2 hours, tried  twice. °· There is no movement in over an hour. °· The pattern is changing or taking longer each day to reach 10 counts in 2 hours. °· You feel the baby is not moving as he or she usually does. °Date: ____________ Movements: ____________ Start time: ____________ Finish time: ____________  °Date: ____________ Movements: ____________ Start time: ____________ Finish time: ____________ °Date: ____________ Movements: ____________ Start time: ____________ Finish time: ____________ °Date: ____________ Movements: ____________ Start time: ____________ Finish time: ____________ °Date: ____________ Movements: ____________ Start time: ____________ Finish time: ____________ °Date: ____________ Movements: ____________ Start time: ____________ Finish time: ____________ °Date: ____________ Movements: ____________ Start time: ____________ Finish time: ____________ °Date: ____________ Movements: ____________ Start time: ____________ Finish time: ____________  °Date: ____________ Movements: ____________ Start time: ____________ Finish time: ____________ °Date: ____________ Movements: ____________ Start time: ____________ Finish time: ____________ °Date: ____________ Movements: ____________ Start time: ____________ Finish time: ____________ °Date: ____________ Movements: ____________ Start time: ____________ Finish time: ____________ °Date: ____________ Movements: ____________ Start time: ____________ Finish time: ____________ °Date: ____________ Movements: ____________ Start time: ____________ Finish time: ____________ °Date: ____________ Movements: ____________ Start time: ____________ Finish time: ____________  °Date: ____________ Movements: ____________ Start time: ____________ Finish time: ____________ °Date: ____________ Movements: ____________ Start time: ____________ Finish time: ____________ °Date: ____________ Movements: ____________ Start time: ____________ Finish time: ____________ °Date: ____________ Movements:  ____________ Start time: ____________ Finish time: ____________ °Date: ____________ Movements: ____________ Start time:   ____________ Finish time: ____________ °Date: ____________ Movements: ____________ Start time: ____________ Finish time: ____________ °Date: ____________ Movements: ____________ Start time: ____________ Finish time: ____________  °Date: ____________ Movements: ____________ Start time: ____________ Finish time: ____________ °Date: ____________ Movements: ____________ Start time: ____________ Finish time: ____________ °Date: ____________ Movements: ____________ Start time: ____________ Finish time: ____________ °Date: ____________ Movements: ____________ Start time: ____________ Finish time: ____________ °Date: ____________ Movements: ____________ Start time: ____________ Finish time: ____________ °Date: ____________ Movements: ____________ Start time: ____________ Finish time: ____________ °Date: ____________ Movements: ____________ Start time: ____________ Finish time: ____________  °Date: ____________ Movements: ____________ Start time: ____________ Finish time: ____________ °Date: ____________ Movements: ____________ Start time: ____________ Finish time: ____________ °Date: ____________ Movements: ____________ Start time: ____________ Finish time: ____________ °Date: ____________ Movements: ____________ Start time: ____________ Finish time: ____________ °Date: ____________ Movements: ____________ Start time: ____________ Finish time: ____________ °Date: ____________ Movements: ____________ Start time: ____________ Finish time: ____________ °Date: ____________ Movements: ____________ Start time: ____________ Finish time: ____________  °Date: ____________ Movements: ____________ Start time: ____________ Finish time: ____________ °Date: ____________ Movements: ____________ Start time: ____________ Finish time: ____________ °Date: ____________ Movements: ____________ Start time: ____________ Finish  time: ____________ °Date: ____________ Movements: ____________ Start time: ____________ Finish time: ____________ °Date: ____________ Movements: ____________ Start time: ____________ Finish time: ____________ °Date: ____________ Movements: ____________ Start time: ____________ Finish time: ____________ °Date: ____________ Movements: ____________ Start time: ____________ Finish time: ____________  °Date: ____________ Movements: ____________ Start time: ____________ Finish time: ____________ °Date: ____________ Movements: ____________ Start time: ____________ Finish time: ____________ °Date: ____________ Movements: ____________ Start time: ____________ Finish time: ____________ °Date: ____________ Movements: ____________ Start time: ____________ Finish time: ____________ °Date: ____________ Movements: ____________ Start time: ____________ Finish time: ____________ °Date: ____________ Movements: ____________ Start time: ____________ Finish time: ____________ °Date: ____________ Movements: ____________ Start time: ____________ Finish time: ____________  °Date: ____________ Movements: ____________ Start time: ____________ Finish time: ____________ °Date: ____________ Movements: ____________ Start time: ____________ Finish time: ____________ °Date: ____________ Movements: ____________ Start time: ____________ Finish time: ____________ °Date: ____________ Movements: ____________ Start time: ____________ Finish time: ____________ °Date: ____________ Movements: ____________ Start time: ____________ Finish time: ____________ °Date: ____________ Movements: ____________ Start time: ____________ Finish time: ____________ °  °This information is not intended to replace advice given to you by your health care provider. Make sure you discuss any questions you have with your health care provider. °  °Document Released: 07/22/2006 Document Revised: 07/13/2014 Document Reviewed: 04/18/2012 °Elsevier Interactive Patient Education ©2016  [employer] ° °

## 2015-11-14 NOTE — MAU Note (Signed)
Pt stated that fetal movement has been decreasing for the past 2 weeks. Md advised her t do kick counts. Reported she only felt baby move 4 times this morning and none last night.

## 2015-11-19 ENCOUNTER — Encounter (HOSPITAL_COMMUNITY): Payer: Self-pay

## 2015-11-19 ENCOUNTER — Inpatient Hospital Stay (HOSPITAL_COMMUNITY)
Admission: AD | Admit: 2015-11-19 | Discharge: 2015-11-22 | DRG: 775 | Disposition: A | Discharge status: Home / Self Care | Payer: 59 | Source: Ambulatory Visit | Attending: Obstetrics and Gynecology | Admitting: Obstetrics and Gynecology

## 2015-11-19 DIAGNOSIS — O24429 Gestational diabetes mellitus in childbirth, unspecified control: Secondary | ICD-10-CM | POA: Diagnosis present

## 2015-11-19 DIAGNOSIS — Z3A38 38 weeks gestation of pregnancy: Secondary | ICD-10-CM

## 2015-11-19 DIAGNOSIS — O4290 Premature rupture of membranes, unspecified as to length of time between rupture and onset of labor, unspecified weeks of gestation: Secondary | ICD-10-CM | POA: Diagnosis present

## 2015-11-19 DIAGNOSIS — O24420 Gestational diabetes mellitus in childbirth, diet controlled: Secondary | ICD-10-CM | POA: Diagnosis not present

## 2015-11-19 DIAGNOSIS — O34211 Maternal care for low transverse scar from previous cesarean delivery: Secondary | ICD-10-CM | POA: Diagnosis present

## 2015-11-19 DIAGNOSIS — Z8759 Personal history of other complications of pregnancy, childbirth and the puerperium: Secondary | ICD-10-CM

## 2015-11-19 DIAGNOSIS — O99824 Streptococcus B carrier state complicating childbirth: Secondary | ICD-10-CM | POA: Diagnosis present

## 2015-11-19 DIAGNOSIS — O34219 Maternal care for unspecified type scar from previous cesarean delivery: Secondary | ICD-10-CM | POA: Diagnosis not present

## 2015-11-19 HISTORY — DX: Personal history of other complications of pregnancy, childbirth and the puerperium: Z87.59

## 2015-11-19 LAB — CBC
HEMATOCRIT: 35.1 % — AB (ref 36.0–46.0)
Hemoglobin: 11.4 g/dL — ABNORMAL LOW (ref 12.0–15.0)
MCH: 27.4 pg (ref 26.0–34.0)
MCHC: 32.5 g/dL (ref 30.0–36.0)
MCV: 84.4 fL (ref 78.0–100.0)
Platelets: 238 10*3/uL (ref 150–400)
RBC: 4.16 MIL/uL (ref 3.87–5.11)
RDW: 15.1 % (ref 11.5–15.5)
WBC: 6.6 10*3/uL (ref 4.0–10.5)

## 2015-11-19 MED ORDER — PHENYLEPHRINE 40 MCG/ML (10ML) SYRINGE FOR IV PUSH (FOR BLOOD PRESSURE SUPPORT)
80.0000 ug | PREFILLED_SYRINGE | INTRAVENOUS | Status: DC | PRN
  Filled 2015-11-19: qty 5

## 2015-11-19 MED ORDER — OXYTOCIN BOLUS FROM INFUSION
500.0000 mL | INTRAVENOUS | Status: DC
  Administered 2015-11-20: 500 mL via INTRAVENOUS

## 2015-11-19 MED ORDER — LACTATED RINGERS IV SOLN
500.0000 mL | INTRAVENOUS | Status: DC | PRN
  Administered 2015-11-20: 250 mL via INTRAVENOUS

## 2015-11-19 MED ORDER — FLEET ENEMA 7-19 GM/118ML RE ENEM: 1.0000 | ENEMA | RECTAL | Status: DC | PRN

## 2015-11-19 MED ORDER — DIPHENHYDRAMINE HCL 50 MG/ML IJ SOLN: 12.5000 mg | INTRAMUSCULAR | Status: DC | PRN

## 2015-11-19 MED ORDER — FENTANYL 2.5 MCG/ML BUPIVACAINE 1/10 % EPIDURAL INFUSION (WH - ANES)
14.0000 mL/h | INTRAMUSCULAR | Status: DC | PRN
  Administered 2015-11-20: 14 mL/h via EPIDURAL
  Filled 2015-11-19: qty 125

## 2015-11-19 MED ORDER — ONDANSETRON HCL 4 MG/2ML IJ SOLN: 4.0000 mg | Freq: Four times a day (QID) | INTRAMUSCULAR | Status: DC | PRN

## 2015-11-19 MED ORDER — EPHEDRINE 5 MG/ML INJ
10.0000 mg | INTRAVENOUS | Status: DC | PRN
  Filled 2015-11-19: qty 2

## 2015-11-19 MED ORDER — LACTATED RINGERS IV SOLN
INTRAVENOUS | Status: DC
  Administered 2015-11-19: 23:00:00 via INTRAVENOUS

## 2015-11-19 MED ORDER — LACTATED RINGERS IV SOLN: 500.0000 mL | Freq: Once | INTRAVENOUS | Status: DC

## 2015-11-19 MED ORDER — OXYTOCIN 40 UNITS IN LACTATED RINGERS INFUSION - SIMPLE MED
2.5000 [IU]/h | INTRAVENOUS | Status: DC
  Filled 2015-11-19: qty 1000

## 2015-11-19 MED ORDER — AMPICILLIN SODIUM 2 G IJ SOLR
2.0000 g | Freq: Once | INTRAMUSCULAR | Status: AC
  Administered 2015-11-20: 2 g via INTRAVENOUS
  Filled 2015-11-19: qty 2000

## 2015-11-19 MED ORDER — PHENYLEPHRINE 40 MCG/ML (10ML) SYRINGE FOR IV PUSH (FOR BLOOD PRESSURE SUPPORT)
80.0000 ug | PREFILLED_SYRINGE | INTRAVENOUS | Status: DC | PRN
  Filled 2015-11-19: qty 5
  Filled 2015-11-19: qty 10

## 2015-11-19 MED ORDER — CITRIC ACID-SODIUM CITRATE 334-500 MG/5ML PO SOLN: 30.0000 mL | ORAL | Status: DC | PRN

## 2015-11-19 MED ORDER — LIDOCAINE HCL (PF) 1 % IJ SOLN
30.0000 mL | INTRAMUSCULAR | Status: DC | PRN
  Administered 2015-11-20: 30 mL via SUBCUTANEOUS
  Filled 2015-11-19: qty 30

## 2015-11-19 NOTE — MAU Note (Signed)
Patient presents with PROM and ctx. Patient states she is having some bloody show. Fetus active.

## 2015-11-20 ENCOUNTER — Inpatient Hospital Stay (HOSPITAL_COMMUNITY): Payer: 59 | Admitting: Anesthesiology

## 2015-11-20 ENCOUNTER — Encounter (HOSPITAL_COMMUNITY): Payer: Self-pay

## 2015-11-20 DIAGNOSIS — O24429 Gestational diabetes mellitus in childbirth, unspecified control: Secondary | ICD-10-CM | POA: Diagnosis present

## 2015-11-20 DIAGNOSIS — Z8759 Personal history of other complications of pregnancy, childbirth and the puerperium: Secondary | ICD-10-CM

## 2015-11-20 HISTORY — DX: Personal history of other complications of pregnancy, childbirth and the puerperium: Z87.59

## 2015-11-20 LAB — TYPE AND SCREEN
ABO/RH(D): O POS
ANTIBODY SCREEN: NEGATIVE

## 2015-11-20 MED ORDER — DIPHENHYDRAMINE HCL 25 MG PO CAPS: 25.0000 mg | ORAL_CAPSULE | Freq: Four times a day (QID) | ORAL | Status: DC | PRN

## 2015-11-20 MED ORDER — IBUPROFEN 600 MG PO TABS
600.0000 mg | ORAL_TABLET | Freq: Four times a day (QID) | ORAL | Status: DC
  Administered 2015-11-20 – 2015-11-22 (×8): 600 mg via ORAL
  Filled 2015-11-20 (×11): qty 1

## 2015-11-20 MED ORDER — ONDANSETRON HCL 4 MG/2ML IJ SOLN
4.0000 mg | INTRAMUSCULAR | Status: DC | PRN
Start: 2015-11-20 — End: 2015-11-22

## 2015-11-20 MED ORDER — ZOLPIDEM TARTRATE 5 MG PO TABS: 5.0000 mg | ORAL_TABLET | Freq: Every evening | ORAL | Status: DC | PRN

## 2015-11-20 MED ORDER — COCONUT OIL OIL: 1.0000 "application " | TOPICAL_OIL | Status: DC | PRN

## 2015-11-20 MED ORDER — LIDOCAINE HCL (PF) 1 % IJ SOLN
INTRAMUSCULAR | Status: DC | PRN
  Administered 2015-11-20: 2 mL via EPIDURAL
  Administered 2015-11-20 (×2): 4 mL via EPIDURAL

## 2015-11-20 MED ORDER — PRENATAL MULTIVITAMIN CH
1.0000 | ORAL_TABLET | Freq: Every day | ORAL | Status: DC
  Administered 2015-11-20 – 2015-11-22 (×3): 1 via ORAL
  Filled 2015-11-20 (×4): qty 1

## 2015-11-20 MED ORDER — ONDANSETRON HCL 4 MG PO TABS: 4.0000 mg | ORAL_TABLET | ORAL | Status: DC | PRN

## 2015-11-20 MED ORDER — FERROUS SULFATE 325 (65 FE) MG PO TABS
325.0000 mg | ORAL_TABLET | Freq: Two times a day (BID) | ORAL | Status: DC
  Administered 2015-11-20 – 2015-11-22 (×5): 325 mg via ORAL
  Filled 2015-11-20 (×5): qty 1

## 2015-11-20 MED ORDER — LIDOCAINE-EPINEPHRINE (PF) 2 %-1:200000 IJ SOLN
INTRAMUSCULAR | Status: DC | PRN
  Administered 2015-11-20: 4 mL via EPIDURAL

## 2015-11-20 MED ORDER — BUPIVACAINE HCL (PF) 0.25 % IJ SOLN
INTRAMUSCULAR | Status: DC | PRN
  Administered 2015-11-20: 8 mL via EPIDURAL

## 2015-11-20 MED ORDER — TERBUTALINE SULFATE 1 MG/ML IJ SOLN
INTRAMUSCULAR | Status: AC
  Filled 2015-11-20: qty 1

## 2015-11-20 MED ORDER — SIMETHICONE 80 MG PO CHEW
80.0000 mg | CHEWABLE_TABLET | ORAL | Status: DC | PRN
Start: 2015-11-20 — End: 2015-11-22

## 2015-11-20 MED ORDER — DIBUCAINE 1 % RE OINT: 1.0000 "application " | TOPICAL_OINTMENT | RECTAL | Status: DC | PRN

## 2015-11-20 MED ORDER — BENZOCAINE-MENTHOL 20-0.5 % EX AERO
1.0000 "application " | INHALATION_SPRAY | CUTANEOUS | Status: DC | PRN
  Administered 2015-11-20: 1 via TOPICAL
  Filled 2015-11-20: qty 56

## 2015-11-20 MED ORDER — WITCH HAZEL-GLYCERIN EX PADS: 1.0000 "application " | MEDICATED_PAD | CUTANEOUS | Status: DC | PRN

## 2015-11-20 MED ORDER — SENNOSIDES-DOCUSATE SODIUM 8.6-50 MG PO TABS
2.0000 | ORAL_TABLET | ORAL | Status: DC
  Administered 2015-11-20: 2 via ORAL
  Filled 2015-11-20 (×2): qty 2

## 2015-11-20 NOTE — Anesthesia Procedure Notes (Signed)
Epidural Patient location during procedure: OB  Staffing Anesthesiologist: Garan Frappier Performed by: anesthesiologist   Preanesthetic Checklist Completed: patient identified, site marked, surgical consent, pre-op evaluation, timeout performed, IV checked, risks and benefits discussed and monitors and equipment checked  Epidural Patient position: sitting Prep: site prepped and draped and DuraPrep Patient monitoring: continuous pulse ox and blood pressure Approach: midline Location: L3-L4 Injection technique: LOR saline  Needle:  Needle type: Tuohy  Needle gauge: 17 G Needle length: 9 cm and 9 Needle insertion depth: 6 cm Catheter type: closed end flexible Catheter size: 19 Gauge Catheter at skin depth: 11 cm Test dose: negative  Assessment Sensory level: T8 Events: blood not aspirated, injection not painful, no injection resistance, negative IV test and no paresthesia  Additional Notes Patient identified. Risks/Benefits/Options discussed with patient including but not limited to bleeding, infection, nerve damage, paralysis, failed block, incomplete pain control, headache, blood pressure changes, nausea, vomiting, reactions to medications, itching and postpartum back pain. Confirmed with bedside nurse the patient's most recent platelet count. Confirmed with patient that they are not currently taking any anticoagulation, have any bleeding history or any family history of bleeding disorders. Patient expressed understanding and wished to proceed. All questions were answered. Sterile technique was used throughout the entire procedure. Please see nursing notes for vital signs. Test dose was given through epidural catheter and negative prior to continuing to dose epidural or start infusion. Warning signs of high block given to the patient including shortness of breath, tingling/numbness in hands, complete motor block, or any concerning symptoms with instructions to call for help. Patient  was given instructions on fall risk and not to get out of bed. All questions and concerns addressed with instructions to call with any issues or inadequate analgesia.

## 2015-11-20 NOTE — Progress Notes (Signed)
Patient voided in the shower, states she was unable to tell she was voiding. Bladder scanned for 242 ml.

## 2015-11-20 NOTE — Anesthesia Postprocedure Evaluation (Signed)
Anesthesia Post Note  Patient: Nicole Landry  Procedure(s) Performed: * No procedures listed *  Patient location during evaluation: Mother Baby Anesthesia Type: Epidural Level of consciousness: awake and alert and oriented Pain management: satisfactory to patient Vital Signs Assessment: post-procedure vital signs reviewed and stable Respiratory status: spontaneous breathing and nonlabored ventilation Cardiovascular status: stable Postop Assessment: no headache, no backache, no signs of nausea or vomiting, adequate PO intake and patient able to bend at knees (patient up walking) Anesthetic complications: no     Last Vitals:  Filed Vitals:   11/20/15 0502 11/20/15 0836  BP: 132/54 121/72  Pulse: 102 93  Temp: 36.9 C 37.3 C  Resp: 18 18    Last Pain:  Filed Vitals:   11/20/15 0838  PainSc: 5    Pain Goal: Patients Stated Pain Goal: 0 (11/19/15 2256)               Madison HickmanGREGORY,Tarik Teixeira

## 2015-11-20 NOTE — Anesthesia Preprocedure Evaluation (Signed)
Anesthesia Evaluation  Patient identified by MRN, date of birth, ID band Patient awake    Reviewed: Allergy & Precautions, H&P , NPO status , Patient's Chart, lab work & pertinent test results  Airway Mallampati: II  TM Distance: >3 FB Neck ROM: full    Dental no notable dental hx.    Pulmonary neg pulmonary ROS,    Pulmonary exam normal breath sounds clear to auscultation       Cardiovascular negative cardio ROS Normal cardiovascular exam Rhythm:regular Rate:Normal     Neuro/Psych negative neurological ROS  negative psych ROS   GI/Hepatic negative GI ROS, Neg liver ROS,   Endo/Other  diabetesMorbid obesity  Renal/GU negative Renal ROS  negative genitourinary   Musculoskeletal   Abdominal   Peds  Hematology negative hematology ROS (+)   Anesthesia Other Findings Pregnancy - had previous C section for fetal bradycardia during labor Platelets and allergies reviewed Denies active cardiac or pulmonary symptoms, METS > 4  Denies blood thinning medications, bleeding disorders, hypertension, asthma, supine hypotension syndrome, previous anesthesia difficulties    Reproductive/Obstetrics (+) Pregnancy                             Anesthesia Physical Anesthesia Plan  ASA: III  Anesthesia Plan: Epidural   Post-op Pain Management:    Induction:   Airway Management Planned:   Additional Equipment:   Intra-op Plan:   Post-operative Plan:   Informed Consent: I have reviewed the patients History and Physical, chart, labs and discussed the procedure including the risks, benefits and alternatives for the proposed anesthesia with the patient or authorized representative who has indicated his/her understanding and acceptance.     Plan Discussed with:   Anesthesia Plan Comments:         Anesthesia Quick Evaluation

## 2015-11-20 NOTE — Progress Notes (Signed)
Mother has attempted to void a couple of times during the night and was unable. She has requested a catheter.

## 2015-11-20 NOTE — Progress Notes (Signed)
Patient unable to void, dr Juliene PinaMody aware. Bladder scanned for 848. Patient is to shower and call me when she is finished.

## 2015-11-20 NOTE — Progress Notes (Signed)
Called @ 1: 36 am For late deceleration On arrival, maternal O2 VE fully (+) 2 station  Tracing notable for fetal tachycardia to 180 (+)  decel down to 120 x 40- 70 sec Ctx not picking up IMP: TOLAC Term gestation amenable to assisted delivery  P) pedi called. Start pushing. Anticipate poss vacuum assistance

## 2015-11-20 NOTE — Progress Notes (Signed)
Patient ID: [name], female   DOB: Dec 16, 1985, 30 y.o.   MRN: [id number] INTERVAL NOTE:  S:   Sitting in bed, skin-to-skin, min cramping, unable to void x 2 episodes, small bleed, denies HA/NV/dizziness  O:   VSS, AAO x 3, NAD  U-1  Scant lochia  Bladder scanned with 780 ml of urine in bladder / I&O cath'd with 750 ml urine in return  A / P:   PPD #0  Stable post partum  Continue hydrating / consider re-inserting Foley catheter, if unable to void x 2 attempts  Routine PP orders  Kenard GowerAWSON, Christyl Osentoski, M, MSN, CNM 11/20/2015, 8:32 AM

## 2015-11-20 NOTE — H&P (Signed)
[name] is a 30 y.o. female  G4P1021 MBF @ 38 6/[redacted] weeks gestation with GDM diet controlled, previous LTCS presenting in active labor. SROm @ 10 pm light meconium. GBS cx (+)  (+) ctx started after SROM . Maternal Medical History:  Reason for admission: Rupture of membranes.   Contractions: Onset was 3-5 hours ago.   Frequency: regular.   Perceived severity is moderate.      OB History    Gravida Para Term Preterm AB TAB SAB Ectopic Multiple Living   4 1 1  2  1 1  1      Past Medical History  Diagnosis Date  . Gestational diabetes   . Ovarian cyst   . Ectopic pregnancy, tubal    Past Surgical History  Procedure Laterality Date  . Cesarean section    . Laparoscopy N/A 02/08/2013    Procedure: LAPAROSCOPY OPERATIVE;  Surgeon: Antionette CharLisa Jackson-Moore, MD;  Location: [facility];  Service: Gynecology;  Laterality: N/A;  . Bilateral salpingectomy Right 02/08/2013    Procedure:  SALPINGECTOMY;  Surgeon: Antionette CharLisa Jackson-Moore, MD;  Location: [facility];  Service: Gynecology;  Laterality: Right;  . Salpingectomy Right   . Right salpingectomy     Family History: family history is not on file. Social History:  reports that she has never smoked. She does not have any smokeless tobacco history on file. She reports that she does not drink alcohol or use illicit drugs.   Prenatal Transfer Tool  Maternal Diabetes: Yes:  Diabetes Type:  Diet controlled Genetic Screening: Declined Maternal Ultrasounds/Referrals: Normal Fetal Ultrasounds or other Referrals:  None Maternal Substance Abuse:  No Significant Maternal Medications:  None Significant Maternal Lab Results:  Lab values include: Group B Strep positive Other Comments:  previous C/S  Review of Systems  All other systems reviewed and are negative.   Dilation: 8 Effacement (%): 80 Station: -2 Exam by:: N. Deal, RN Blood pressure 131/81, pulse 99, temperature 98.1 F (36.7 C), temperature source Oral, resp. rate 18, last menstrual period  02/21/2015. Maternal Exam:  Uterine Assessment: Contraction strength is moderate.  Contraction frequency is regular.   Abdomen: Patient reports no abdominal tenderness. Surgical scars: low transverse.   Estimated fetal weight is 8 1/2 lb.   Fetal presentation: vertex  Introitus: Amniotic fluid character: meconium stained.  Pelvis: adequate for delivery.   Cervix: Cervix evaluated by digital exam.     Fetal Exam Fetal Monitor Review: Variability: moderate (6-25 bpm).    Fetal State Assessment: Category I - tracings are normal.     Physical Exam  Constitutional: She is oriented to person, place, and time. She appears well-developed and well-nourished.  HENT:  Head: Atraumatic.  Neck: Neck supple.  Cardiovascular: Regular rhythm.   Respiratory: Effort normal.  Musculoskeletal: She exhibits no edema.  Neurological: She is alert and oriented to person, place, and time. She has normal reflexes.  Skin: Skin is warm and dry.  Psychiatric: She has a normal mood and affect.    Prenatal labs: ABO, Rh: --/--/O POS (05/16 2325) Antibody: NEG (05/16 2325) Rubella: Immune (11/08 0000) RPR: Nonreactive (11/08 0000)  HBsAg: Negative (11/08 0000)  HIV: Non-reactive (11/08 0000)  GBS: Positive (05/01 0000)   Assessment/Plan: Active labor Previous C/S desires VBAC GBS cx (+) IUP @ 38 6/7 weeks GDM diet controlled P) admit epidural. Amniotomy prn. Ampicillin due to advanced dilation. Routine labs. BS q 4 hrs    Nicole Landry A 11/20/2015, 12:40 am

## 2015-11-20 NOTE — Lactation Note (Signed)
This note was copied from a baby's chart. Lactation Consultation Note  Patient Name: Nicole Landry UJWJX'BToday's Date: 11/20/2015 Reason for consult: Initial assessment Baby just coming off breast when LC arrived. Mom reports some mild nipple tenderness, reviewed positioning and how to obtain more depth with latch. Mom reports baby not latching to left breast, Mom reports nipple more flat. LC offered assistance at this visit, Mom declined. Mom reports 1st baby did not latch to left breast so she BF from right breast. She reports trying nipple shield. LC encouraged Mom to start pumping left breast to help bring milk in if she is not going to try latching on that side, Mom declined for right now, saying "maybe later". Basic teaching reviewed with Mom, advised baby should be at breast 8-12 times in 24 hours and with feeding ques. Advised to apply EBM to right nipple for tenderness. Lactation brochure left for review, advised of OP services and support group. Encouraged Mom to call for assist with latching on left breast and to consider pumping.   Maternal Data Has patient been taught Hand Expression?: No (Mom reports she knows how to hand express)  Feeding Feeding Type: Breast Fed Length of feed: 10 min  LATCH Score/Interventions Latch: Grasps breast easily, tongue down, lips flanged, rhythmical sucking.  Audible Swallowing: A few with stimulation  Type of Nipple: Everted at rest and after stimulation  Comfort (Breast/Nipple): Soft / non-tender     Hold (Positioning): No assistance needed to correctly position infant at breast.  LATCH Score: 9  Lactation Tools Discussed/Used WIC Program: No   Consult Status Consult Status: Follow-up Date: 11/20/15 Follow-up type: In-patient    Alfred LevinsGranger, Germaine Ripp Ann 11/20/2015, 3:58 PM

## 2015-11-20 NOTE — Progress Notes (Signed)
Late entry due to system down   11/20/2015 @ 12: 38 am S: Epidural done AMpicillin IV infusing Notes pain on lower right side VE 6/80/-2 IUPC/ISE placed Tracing: baseline 155 Ctx q 2 mins  IMP: TOLAC GDM diet GBS cx (+) P) left exaggerated sims position when more comfortable. Await better pain control with epidural

## 2015-11-21 LAB — CBC
HCT: 29 % — ABNORMAL LOW (ref 36.0–46.0)
Hemoglobin: 9.2 g/dL — ABNORMAL LOW (ref 12.0–15.0)
MCH: 27 pg (ref 26.0–34.0)
MCHC: 31.7 g/dL (ref 30.0–36.0)
MCV: 85 fL (ref 78.0–100.0)
PLATELETS: 203 10*3/uL (ref 150–400)
RBC: 3.41 MIL/uL — ABNORMAL LOW (ref 3.87–5.11)
RDW: 15.5 % (ref 11.5–15.5)
WBC: 7.6 10*3/uL (ref 4.0–10.5)

## 2015-11-21 LAB — RPR: RPR Ser Ql: NONREACTIVE

## 2015-11-21 MED ORDER — OXYCODONE-ACETAMINOPHEN 5-325 MG PO TABS
1.0000 | ORAL_TABLET | Freq: Four times a day (QID) | ORAL | Status: DC | PRN
  Administered 2015-11-21: 1 via ORAL
  Filled 2015-11-21: qty 1

## 2015-11-21 NOTE — Lactation Note (Addendum)
This note was copied from a baby's chart. Lactation Consultation Note  Patient Name: Nicole Landry HYQMV'HToday's Date: 11/21/2015 Reason for consult: Follow-up assessment;Other (Comment) (4% weight loss )  Baby is 1135 hour old and has been consistent at the breast according to the doc flow sheets.  Per mom the baby just fed 30 mins and the feeding was comfortable and reports swallows.  Mom denies soreness. Sore nipple and engorgement prevention and tx reviewed.  Per mom [facility]  [facility] .  Mother informed of post-discharge support and given phone number to the lactation department,  including services for phone call assistance; out-patient appointments; and breastfeeding support group.  List of other breastfeeding resources in the community given in the handout. Encouraged mother to call  for problems or concerns related to breastfeeding. LC reviewed doc flow sheets , voids and stools adequate for age, breast feeding consistently w/ latch score range 7-9's     Maternal Data    Feeding Feeding Type:  (per mom recently breast fed 30 mins ) Length of feed: 30 min (per mom )  LATCH Score/Interventions Latch: Repeated attempts needed to sustain latch, nipple held in mouth throughout feeding, stimulation needed to elicit sucking reflex.  Audible Swallowing: A few with stimulation  Type of Nipple: Everted at rest and after stimulation  Comfort (Breast/Nipple): Filling, red/small blisters or bruises, mild/mod discomfort  Problem noted: Mild/Moderate discomfort  Hold (Positioning): No assistance needed to correctly position infant at breast.  LATCH Score: 7  Lactation Tools Discussed/Used     Consult Status Consult Status: Follow-up Date: 11/21/15 Follow-up type: In-patient    Kathrin Greathouseorio, Destiny Hagin Ann 11/21/2015, 2:01 PM

## 2015-11-21 NOTE — Lactation Note (Signed)
This note was copied from a baby's chart. Lactation Consultation Note  Patient Name: [name] ZOXWR'UToday's Date: 11/21/2015 Reason for consult: Follow-up assessment Baby at 39 hr of life and mom is reporting nipple pain. She is only bf off the R side, she refuses to use the L or let lactation assess that side. She stated the nipple is "fuuny" and she never produced milk from that side with her older child. She declined pump for L. She does have a thin crack at the junction of the areola and the base of the nipple on the anterior, lateral edge of the R breast. Given comfort gels. She is aware of lactation services and support group.   Maternal Data    Feeding Feeding Type: Breast Fed Length of feed: 60 min  LATCH Score/Interventions                      Lactation Tools Discussed/Used     Consult Status Consult Status: Follow-up Date: 11/22/15 Follow-up type: In-patient    Rulon Eisenmengerlizabeth E Julene Rahn 11/21/2015, 5:45 PM

## 2015-11-21 NOTE — Progress Notes (Signed)
Patient ID: Nicole Landry, female   DOB: 1986-01-10, 30 y.o.   MRN: [id number] PPD # 1  Subjective: Pt reports feeling "ok". / Minimal pain control with ibuprofen and tylenol. Tolerating po/ Initially with urinary retention and inability to void, now voiding without problems / No n/v Bleeding is moderate Newborn info:  Information for the patient's newborn:  Nicole Landry, Girl Petrona [[id number]][[id number]]  female Feeding: breast   Objective:  VS: Blood pressure 113/63, pulse 88, temperature 98 F (36.7 C), temperature source Oral, resp. rate 16.    Recent Labs  11/19/15 2325 11/21/15 0521  WBC 6.6 7.6  HGB 11.4* 9.2*  HCT 35.1* 29.0*  PLT 238 203    Blood type: O POS Rubella: Immune    Physical Exam:  General: alert, cooperative and no distress CV: Regular rate and rhythm Resp: clear Abdomen: soft, nontender, normal bowel sounds Uterine Fundus: firm, below umbilicus, nontender Perineum: healing with good reapproximation and mod edema Lochia: moderate Ext: edema trace and Homans sign is negative, no sign of DVT   A/P: PPD # 1/ G4P2021/ S/P: VAVD w/ 3rd deg lac ABL anemia Doing well Continue routine post partum orders Anticipate D/C home in AM    Demetrius RevelFISHER,Jabron Weese K, MSN, [facility] 11/21/2015, 8:43 AM

## 2015-11-22 MED ORDER — TRAMADOL HCL 50 MG PO TABS: 50.0000 mg | ORAL_TABLET | Freq: Four times a day (QID) | ORAL | Status: DC | PRN

## 2015-11-22 MED ORDER — IBUPROFEN 800 MG PO TABS: 800.0000 mg | ORAL_TABLET | Freq: Four times a day (QID) | ORAL | Status: DC

## 2015-11-22 NOTE — Lactation Note (Signed)
This note was copied from a baby's chart. Lactation Consultation Note  Patient Name: Nicole Lilla ShookDominique Landry RUEAV'WToday's Date: 11/22/2015 Reason for consult: Follow-up assessment  Baby is 56 hours old 9% weight loss , has been breast feeding well on the right breast and mom reports swallows.  Nipple  Has been sensitive , but the comfort are helping . LC mentioned to mom when the breast get warmer, heavier,  Fuller , nipples sensitive, it's normal and sign milk is coming in. Per mom I only breast feed on the right side and supplemented  During the night due to the baby cluster feeding and not being satisfied. LC explored options with mom for right side  And discussed concerns of no stimulation , and since it is moms 2nd baby the milk may come in and it would be important  To prevent complications of plugged ducts , mastitis. Per mom with my 1st baby the milk really never came in on that breast.  [facility] - areola compressible and nipple erect ( small ) , but not flat or inverted.  LC recommended breast massage , hand express, pre- pump with hand pump to make the nipple areola complex more elastic Also prime the milk ducts, laid back positioning ( LC showed mom and she was familiar with it ). If the baby wouldn't latch - to be consistent With pumping . Per mom the milk taste different from the left breast - bitter compared to the right sweet. ( per mom has tasted the milk )  Sore nipple and engorgement prevention and tx reviewed.  Per mom plans to obtain a DEBP today . LC recommended due to 9% weight loss, add 4 post pumping for 10 mins when baby isn't cluster feeding Save milk and supplement back to baby.  Mother informed of post-discharge support and given phone number to the lactation department, including services for phone call assistance; out-patient appointments; and breastfeeding support group. List of other breastfeeding resources in the community given in the  handout. Encouraged mother to call for problems or concerns related to breastfeeding.   Maternal Data    Feeding Feeding Type: Breast Fed Length of feed:  (per mom )  LATCH Score/Interventions                      Lactation Tools Discussed/Used Tools: Pump;Comfort gels Breast pump type: Manual (also had a DEBP set up last night )   Consult Status Consult Status: Complete Date: 11/22/15    Nicole Landry, Nicole Landry Ann 11/22/2015, 10:16 AM

## 2015-11-22 NOTE — Progress Notes (Signed)
PPD 2 SVD  S:  Reports feeling ok still a lot of pain in bottom             Tolerating po/ No nausea or vomiting             Bleeding is light             Pain controlled withibuprofen (OTC)             Up ad lib / ambulatory / voiding QS  Newborn breast feeding   O:               VS: BP 124/72 mmHg  Pulse 98  Temp(Src) 98.5 F (36.9 C) (Oral)  Resp 18  SpO2 100%  LMP 02/21/2015 (Exact Date)  Breastfeeding? Unknown   LABS:              Recent Labs  11/19/15 2325 11/21/15 0521  WBC 6.6 7.6  HGB 11.4* 9.2*  PLT 238 203               Blood type: --/--/O POS (05/16 2325)  Rubella: Immune (11/08 0000)                                Physical Exam:             Alert and oriented X3  Abdomen: soft, non-tender, non-distended              Fundus: firm, non-tender, U-1  Perineum: mild edema  Lochia: light  Extremities: no edema, no calf pain or tenderness    A: PPD # 2   Doing well - stable status  P: Routine post partum orders  DC home - adjust pain management - call if not working well at home  Marlinda MikeBAILEY, Nida Manfredi CNM, MSN, [facility] 11/22/2015, 8:22 AM

## 2015-11-22 NOTE — Discharge Summary (Signed)
Obstetric Discharge Summary  Reason for Admission: onset of labor, previous Cesarean  Section, Class A1 GDM, GBS cx (+) Prenatal Procedures: ultrasound Intrapartum Procedures: vacuum and GBS prophylaxis  Postpartum Procedures: none Complications-Operative and Postpartum: 2nd degree perineal laceration and sulcus repair HEMOGLOBIN  Date Value Ref Range Status  11/21/2015 9.2* 12.0 - 15.0 g/dL Final   HCT  Date Value Ref Range Status  11/21/2015 29.0* 36.0 - 46.0 % Final    Physical Exam:  General: alert, cooperative and no distress Lochia: appropriate Uterine Fundus: firm Incision: healing well DVT Evaluation: No evidence of DVT seen on physical exam.  Discharge Diagnoses: Term Pregnancy-delivered, Class A1 GDM, Previous Cesarean section  Discharge Information: Date: 11/22/2015 Activity: pelvic rest Diet: routine Medications: PNV and Ibuprofen and tramadol Condition: stable Instructions: refer to practice specific booklet Discharge to: home Follow-up Information    Follow up with Miaa Latterell A, MD. Schedule an appointment as soon as possible for a visit in 6 weeks.   Specialty:  Obstetrics and Gynecology   Contact information:   [address] [place] [id number] [phone]       Newborn Data: Live born female  Birth Weight: 8 lb 2.2 oz (3690 g) APGAR: 8, 9  Home with mother.  Marlinda MikeBAILEY, TANYA 11/22/2015, 11:26 AM

## 2015-12-13 MED FILL — CLINDAMYCIN HCL 300 MG CAPS: 300 | 7 days supply | Qty: 14 | Fill #0

## 2016-02-19 ENCOUNTER — Ambulatory Visit (INDEPENDENT_AMBULATORY_CARE_PROVIDER_SITE_OTHER): Payer: 59 | Admitting: Urgent Care

## 2016-02-19 VITALS — BP 132/88 | HR 85 | Temp 98.1°F | Resp 17 | Ht 62.0 in | Wt 200.0 lb

## 2016-02-19 DIAGNOSIS — H9313 Tinnitus, bilateral: Secondary | ICD-10-CM | POA: Diagnosis not present

## 2016-02-19 DIAGNOSIS — J302 Other seasonal allergic rhinitis: Secondary | ICD-10-CM

## 2016-02-19 DIAGNOSIS — H9203 Otalgia, bilateral: Secondary | ICD-10-CM

## 2016-02-19 MED ORDER — PSEUDOEPHEDRINE HCL ER 120 MG PO TB12: 120.0000 mg | ORAL_TABLET | Freq: Two times a day (BID) | ORAL | 3 refills | Status: DC

## 2016-02-19 MED ORDER — AMOXICILLIN 875 MG PO TABS: 875.0000 mg | ORAL_TABLET | Freq: Two times a day (BID) | ORAL | 0 refills | Status: DC

## 2016-02-19 MED ORDER — CETIRIZINE HCL 10 MG PO TABS: 10.0000 mg | ORAL_TABLET | Freq: Every day | ORAL | 11 refills | Status: DC

## 2016-02-19 NOTE — Patient Instructions (Addendum)
Barotitis Media Barotitis media is inflammation of your middle ear. This occurs when the auditory tube (eustachian tube) leading from the back of your nose (nasopharynx) to your eardrum is blocked. This blockage may result from a cold, environmental allergies, or an upper respiratory infection. Unresolved barotitis media may lead to damage or hearing loss (barotrauma), which may become permanent. HOME CARE INSTRUCTIONS   Use medicines as recommended by your health care provider. Over-the-counter medicines will help unblock the canal and can help during times of air travel.  Do not put anything into your ears to clean or unplug them. Eardrops will not be helpful.  Do not swim, dive, or fly until your health care provider says it is all right to do so. If these activities are necessary, chewing gum with frequent, forceful swallowing may help. It is also helpful to hold your nose and gently blow to pop your ears for equalizing pressure changes. This forces air into the eustachian tube.  Only take over-the-counter or prescription medicines for pain, discomfort, or fever as directed by your health care provider.  A decongestant may be helpful in decongesting the middle ear and make pressure equalization easier. SEEK MEDICAL CARE IF:  You experience a serious form of dizziness in which you feel as if the room is spinning and you feel nauseated (vertigo).  Your symptoms only involve one ear. SEEK IMMEDIATE MEDICAL CARE IF:   You develop a severe headache, dizziness, or severe ear pain.  You have bloody or pus-like drainage from your ears.  You develop a fever.  Your problems do not improve or become worse. MAKE SURE YOU:   Understand these instructions.  Will watch your condition.  Will get help right away if you are not doing well or get worse.   This information is not intended to replace advice given to you by your health care provider. Make sure you discuss any questions you have with  your health care provider.   Document Released: 06/19/2000 Document Revised: 04/12/2013 Document Reviewed: 01/17/2013 Elsevier Interactive Patient Education 2016 [employer]     IF you received an x-ray today, you will receive an invoice from [facility]. Please contact [facility] at [phone] with questions or concerns regarding your invoice.   IF you received labwork today, you will receive an invoice from [facility]. Please contact [facility] at [phone] with questions or concerns regarding your invoice.   Our billing staff will not be able to assist you with questions regarding bills from these companies.  You will be contacted with the lab results as soon as they are available. The fastest way to get your results is to activate your My Chart account. Instructions are located on the last page of this paperwork. If you have not heard from us regarding the results in 2 weeks, please contact this office.      

## 2016-02-19 NOTE — Progress Notes (Signed)
    MRN: [id number] DOB: [birth year]  Subjective:   Nicole Landry is a 30 y.o. female presenting for chief complaint of Ear Problem ("Pressure, pain, popping". Bilateral x2weeks)  Reports 2 week history of worsening bilateral ear pain (L>R) associated with tinnitus and occasional clear ear drainage. Denies fever, dizziness, sinus pain, sinus congestion, sore throat, cough. Admits history of seasonal allergies, not currently taking anything for this. She also admits that she has her husband clean out her ears using bobby pins. Patient is breast-feeding.  Nicole Landry has a current medication list which includes the following prescription(s): ferrous sulfate, ibuprofen, prenatal multivitamin, and tramadol. Also is allergic to other and vicodin [hydrocodone-acetaminophen].  Nicole Landry  has a past medical history of Ectopic pregnancy, tubal; Gestational diabetes; Ovarian cyst; and Status post vacuum-assisted vaginal delivery (5/17) (11/20/2015). Also  has a past surgical history that includes Cesarean section; laparoscopy (N/A, 02/08/2013); Bilateral salpingectomy (Right, 02/08/2013); Salpingectomy (Right); and right salpingectomy.  Objective:   Vitals: BP 132/88 (BP Location: Right Arm, Patient Position: Sitting, Cuff Size: Normal)   Pulse 85   Temp 98.1 F (36.7 C) (Oral)   Resp 17   Ht 5\' 2"  (1.575 m)   Wt 200 lb (90.7 kg)   LMP 02/11/2016   SpO2 98%   BMI 36.58 kg/m   Physical Exam  Constitutional: She is oriented to person, place, and time. She appears well-developed and well-nourished.  HENT:  Outermost external ear canal with slight erythema bilaterally, TM's flat but intact and has tragus tenderness bilaterally (L>R) but no effusions, drainage of pus or bleeding. Nasal turbinates pink and moist without sinus tenderness. Throat without exudates, erythema or abscesses.  Eyes: Right eye exhibits no discharge. Left eye exhibits no discharge.  Neck: Normal range of motion. Neck supple.    Cardiovascular: Normal rate.   Pulmonary/Chest: Effort normal.  Lymphadenopathy:    She has cervical adenopathy (left upper).  Neurological: She is alert and oriented to person, place, and time.  Skin: Skin is warm and dry.   Assessment and Plan :   1. Ear pain, bilateral 2. Tinnitus, bilateral 3. Seasonal allergies - I discussed appropriate ear cleaning with patient. I suspect her ear pain is both due to mechanical irritation and ETD. I will have patient start antihistamine and decongestant first. She is to stop using bobby pins and Q-tips the way she has. If her problem persists over the next 3-4 days, she will start amoxicillin to cover for otitis media. RTC thereafter if she still does not achieve resolution of her symptoms.   Wallis BambergMario Damere Brandenburg, PA-C [facility] [phone] 02/19/2016 9:50 AM

## 2016-04-08 DIAGNOSIS — H5213 Myopia, bilateral: Secondary | ICD-10-CM | POA: Diagnosis not present

## 2016-04-09 ENCOUNTER — Encounter (INDEPENDENT_AMBULATORY_CARE_PROVIDER_SITE_OTHER): Payer: 59 | Admitting: Ophthalmology

## 2016-04-09 DIAGNOSIS — H43813 Vitreous degeneration, bilateral: Secondary | ICD-10-CM | POA: Diagnosis not present

## 2016-04-09 DIAGNOSIS — H3091 Unspecified chorioretinal inflammation, right eye: Secondary | ICD-10-CM

## 2016-04-13 ENCOUNTER — Encounter (INDEPENDENT_AMBULATORY_CARE_PROVIDER_SITE_OTHER): Payer: 59 | Admitting: Ophthalmology

## 2016-04-15 ENCOUNTER — Encounter (INDEPENDENT_AMBULATORY_CARE_PROVIDER_SITE_OTHER): Payer: 59 | Admitting: Ophthalmology

## 2016-04-17 ENCOUNTER — Encounter (INDEPENDENT_AMBULATORY_CARE_PROVIDER_SITE_OTHER): Payer: 59 | Admitting: Ophthalmology

## 2016-10-21 ENCOUNTER — Ambulatory Visit (INDEPENDENT_AMBULATORY_CARE_PROVIDER_SITE_OTHER): Payer: BLUE CROSS/BLUE SHIELD | Admitting: Physician Assistant

## 2016-10-21 VITALS — BP 110/74 | HR 72 | Temp 98.4°F | Resp 16 | Ht 62.0 in | Wt 203.0 lb

## 2016-10-21 DIAGNOSIS — J039 Acute tonsillitis, unspecified: Secondary | ICD-10-CM

## 2016-10-21 MED ORDER — AMOXICILLIN 875 MG PO TABS: 875.0000 mg | ORAL_TABLET | Freq: Two times a day (BID) | ORAL | 0 refills | Status: DC

## 2016-10-21 MED FILL — AMOXICILLIN 875 MG TABLET: 875 | 8 days supply | Qty: 16 | Fill #0

## 2016-10-21 NOTE — Progress Notes (Signed)
10/21/2016 8:41 AM   DOB: 02/26/1986 / MRN: [id number]  SUBJECTIVE:  Nicole Landry is a 31 y.o. female presenting for sore thorat that staretdd 2 weeks ago.  Looked in her throat on Sunday and had white spots in her throat.  Tried some amox and tells me that she started feeling better.    She is allergic to other and vicodin [hydrocodone-acetaminophen].   She  has a past medical history of Ectopic pregnancy, tubal; Gestational diabetes; Ovarian cyst; and Status post vacuum-assisted vaginal delivery (5/17) (11/20/2015).    She  reports that she has never smoked. She does not have any smokeless tobacco history on file. She reports that she does not drink alcohol or use drugs. She  reports that she currently engages in sexual activity. She reports using the following method of birth control/protection: None. The patient  has a past surgical history that includes Cesarean section; laparoscopy (N/A, 02/08/2013); Bilateral salpingectomy (Right, 02/08/2013); Salpingectomy (Right); and right salpingectomy.  Her family history is not on file.  Review of Systems  Constitutional: Negative for chills, diaphoresis and fever.  HENT: Negative for congestion.   Respiratory: Negative for cough, hemoptysis, sputum production, shortness of breath and wheezing.   Cardiovascular: Negative for chest pain.  Gastrointestinal: Negative for nausea.  Skin: Negative for rash.  Neurological: Negative for dizziness.    The problem list and medications were reviewed and updated by myself where necessary and exist elsewhere in the encounter.   OBJECTIVE:  BP 110/74   Pulse 72   Temp 98.4 F (36.9 C) (Oral)   Resp 16   Ht  (1.575 m)   Wt 203 lb (92.1 kg)   SpO2 99%   BMI 37.13 kg/m   Physical Exam  Constitutional: She is active.  HENT:  Right Ear: Hearing, tympanic membrane, external ear and ear canal normal.  Left Ear: Hearing, tympanic membrane, external ear and ear canal normal.  Nose: Nose normal.  Right sinus exhibits no maxillary sinus tenderness and no frontal sinus tenderness. Left sinus exhibits no maxillary sinus tenderness and no frontal sinus tenderness.  Mouth/Throat: Uvula is midline and mucous membranes are normal. Mucous membranes are not dry. Oropharyngeal exudate present. No posterior oropharyngeal edema or tonsillar abscesses.  Cardiovascular: Normal rate.   Pulmonary/Chest: Effort normal. No tachypnea.  Lymphadenopathy:       Head (right side): Tonsillar adenopathy present. No submandibular adenopathy present.       Head (left side): Tonsillar adenopathy present. No submandibular adenopathy present.    She has no cervical adenopathy.  Neurological: She is alert.  Skin: Skin is warm.    No results found for this or any previous visit (from the past 72 hour(s)).  No results found.  ASSESSMENT AND PLAN:  Autymn was seen today for sore throat.  Diagnoses and all orders for this visit:  Exudative tonsillitis: She has already treated herself making a negative culture or rapid unlikely to change my thinking.  Will forgo that and treat given exudate, lymphadenopathy, and lack of other complaints.  -     amoxicillin (AMOXIL) 875 MG tablet; Take 1 tablet (875 mg total) by mouth 2 (two) times daily. -     Care order/instruction:    The patient is advised to call or return to clinic if she does not see an improvement in symptoms, or to seek the care of the closest emergency department if she worsens with the above plan.   Deliah Boston, MHS, PA-C [facility] 10/21/2016 8:41 AM

## 2016-12-19 ENCOUNTER — Inpatient Hospital Stay (HOSPITAL_COMMUNITY): Payer: BLUE CROSS/BLUE SHIELD

## 2016-12-19 ENCOUNTER — Encounter (HOSPITAL_COMMUNITY): Payer: Self-pay

## 2016-12-19 ENCOUNTER — Inpatient Hospital Stay (HOSPITAL_COMMUNITY)
Admission: AD | Admit: 2016-12-19 | Discharge: 2016-12-19 | Disposition: A | Discharge status: Home / Self Care | Payer: BLUE CROSS/BLUE SHIELD | Source: Ambulatory Visit | Attending: Obstetrics and Gynecology | Admitting: Obstetrics and Gynecology

## 2016-12-19 ENCOUNTER — Other Ambulatory Visit: Payer: Self-pay | Admitting: Student

## 2016-12-19 DIAGNOSIS — R1030 Lower abdominal pain, unspecified: Secondary | ICD-10-CM | POA: Insufficient documentation

## 2016-12-19 DIAGNOSIS — O3680X0 Pregnancy with inconclusive fetal viability, not applicable or unspecified: Secondary | ICD-10-CM

## 2016-12-19 DIAGNOSIS — R109 Unspecified abdominal pain: Secondary | ICD-10-CM

## 2016-12-19 DIAGNOSIS — Z3A01 Less than 8 weeks gestation of pregnancy: Secondary | ICD-10-CM | POA: Diagnosis not present

## 2016-12-19 DIAGNOSIS — O26899 Other specified pregnancy related conditions, unspecified trimester: Secondary | ICD-10-CM

## 2016-12-19 DIAGNOSIS — O2341 Unspecified infection of urinary tract in pregnancy, first trimester: Secondary | ICD-10-CM | POA: Insufficient documentation

## 2016-12-19 DIAGNOSIS — O2340 Unspecified infection of urinary tract in pregnancy, unspecified trimester: Secondary | ICD-10-CM | POA: Diagnosis not present

## 2016-12-19 DIAGNOSIS — O26891 Other specified pregnancy related conditions, first trimester: Secondary | ICD-10-CM | POA: Diagnosis not present

## 2016-12-19 LAB — URINALYSIS, ROUTINE W REFLEX MICROSCOPIC
BILIRUBIN URINE: NEGATIVE
GLUCOSE, UA: NEGATIVE mg/dL
KETONES UR: NEGATIVE mg/dL
Nitrite: POSITIVE — AB
PH: 5 (ref 5.0–8.0)
Protein, ur: 30 mg/dL — AB
Specific Gravity, Urine: 1.03 (ref 1.005–1.030)

## 2016-12-19 LAB — CBC
HCT: 33.3 % — ABNORMAL LOW (ref 36.0–46.0)
Hemoglobin: 10.8 g/dL — ABNORMAL LOW (ref 12.0–15.0)
MCH: 27.6 pg (ref 26.0–34.0)
MCHC: 32.4 g/dL (ref 30.0–36.0)
MCV: 84.9 fL (ref 78.0–100.0)
PLATELETS: 337 10*3/uL (ref 150–400)
RBC: 3.92 MIL/uL (ref 3.87–5.11)
RDW: 13.9 % (ref 11.5–15.5)
WBC: 5.9 10*3/uL (ref 4.0–10.5)

## 2016-12-19 LAB — WET PREP, GENITAL
Clue Cells Wet Prep HPF POC: NONE SEEN
Sperm: NONE SEEN
TRICH WET PREP: NONE SEEN
YEAST WET PREP: NONE SEEN

## 2016-12-19 LAB — POCT PREGNANCY, URINE: Preg Test, Ur: POSITIVE — AB

## 2016-12-19 LAB — HIV ANTIBODY (ROUTINE TESTING W REFLEX): HIV Screen 4th Generation wRfx: NONREACTIVE

## 2016-12-19 LAB — HCG, QUANTITATIVE, PREGNANCY: HCG, BETA CHAIN, QUANT, S: 2664 m[IU]/mL — AB (ref <5)

## 2016-12-19 MED ORDER — CEPHALEXIN 500 MG PO CAPS: 500.0000 mg | ORAL_CAPSULE | Freq: Four times a day (QID) | ORAL | 0 refills | Status: DC

## 2016-12-19 NOTE — Discharge Instructions (Signed)
Return to care   If you have heavier bleeding that soaks through more that 2 pads per hour for an hour or more  If you bleed so much that you feel like you might pass out or you do pass out  If you have significant abdominal pain that is not improved with Tylenol   If you develop a fever > 100.5    Abdominal Pain During Pregnancy Abdominal pain is common in pregnancy. Most of the time, it does not cause harm. There are many causes of abdominal pain. Some causes are more serious than others and sometimes the cause is not known. Abdominal pain can be a sign that something is very wrong with the pregnancy or the pain may have nothing to do with the pregnancy. Always tell your health care provider if you have any abdominal pain. Follow these instructions at home:  Do not have sex or put anything in your vagina until your symptoms go away completely.  Watch your abdominal pain for any changes.  Get plenty of rest until your pain improves.  Drink enough fluid to keep your urine clear or pale yellow.  Take over-the-counter or prescription medicines only as told by your health care provider.  Keep all follow-up visits as told by your health care provider. This is important. Contact a health care provider if:  You have a fever.  Your pain gets worse or you have cramping.  Your pain continues after resting. Get help right away if:  You are bleeding, leaking fluid, or passing tissue from the vagina.  You have vomiting or diarrhea that does not go away.  You have painful or bloody urination.  You notice a decrease in your baby's movements.  You feel very weak or faint.  You have shortness of breath.  You develop a severe headache with abdominal pain.  You have abnormal vaginal discharge with abdominal pain. This information is not intended to replace advice given to you by your health care provider. Make sure you discuss any questions you have with your health care  provider. Document Released: 06/22/2005 Document Revised: 04/02/2016 Document Reviewed: 01/19/2013 Elsevier Interactive Patient Education  2018 [employer].  Pregnancy and Urinary Tract Infection What is a urinary tract infection? A urinary tract infection (UTI) is an infection of any part of the urinary tract. This includes the kidneys, the tubes that connect your kidneys to your bladder (ureters), the bladder, and the tube that carries urine out of your body (urethra). These organs make, store, and get rid of urine in the body. A UTI can be a bladder infection (cystitis) or a kidney infection (pyelonephritis). This infection may be caused by fungi, viruses, and bacteria. Bacteria are the most common cause of UTIs. You are more likely to develop a UTI during pregnancy because:  The physical and hormonal changes your body goes through can make it easier for bacteria to get into your urinary tract.  Your growing baby puts pressure on your uterus and can affect urine flow.  Does a UTI place my baby at risk? An untreated UTI during pregnancy could lead to a kidney infection, which can cause health problems that could affect your baby. Possible complications of an untreated UTI include:  Having your baby before 37 weeks of pregnancy (premature).  Having a baby with a low birth weight.  Developing high blood pressure during pregnancy (preeclampsia).  What are the symptoms of a UTI? Symptoms of a UTI include:  Fever.  Frequent urination or  passing small amounts of urine frequently.  Needing to urinate urgently.  Pain or a burning sensation with urination.  Urine that smells bad or unusual.  Cloudy urine.  Pain in the lower abdomen or back.  Trouble urinating.  Blood in the urine.  Vomiting or being less hungry than normal.  Diarrhea or abdominal pain.  Vaginal discharge.  What are the treatment options for a UTI during pregnancy? Treatment for this condition may  include:  Antibiotic medicines that are safe to take during pregnancy.  Other medicines to treat less common causes of UTI.  How can I prevent a UTI?  To prevent a UTI:  Go to the bathroom as soon as you feel the need.  Always wipe from front to back.  Wash your genital area with soap and warm water daily.  Empty your bladder before and after sex.  Wear cotton underwear.  Limit your intake of high sugar foods or drinks, such as regular soda, juice, and sweets.  Drink 6-8 glasses of water daily.  Do not wear tight-fitting pants.  Do not douche or use deodorant sprays.  Do not drink alcohol, caffeine, or carbonated drinks. These can irritate the bladder.  Contact a health care provider if:  Your symptoms do not improve or get worse.  You have a fever after two days of treatment.  You have a rash.  You have abnormal vaginal discharge.  You have back or side pain.  You have chills.  You have nausea and vomiting. Get help right away if: Seek immediate medical care if you are pregnant and:  You feel contractions in your uterus.  You have lower belly pain.  You have a gush of fluid from your vagina.  You have blood in your urine.  You are vomiting and cannot keep down any medicines or water.  This information is not intended to replace advice given to you by your health care provider. Make sure you discuss any questions you have with your health care provider. Document Released: 10/17/2010 Document Revised: 06/05/2016 Document Reviewed: 05/13/2015 Elsevier Interactive Patient Education  2017 [employer]

## 2016-12-19 NOTE — MAU Provider Note (Signed)
History     CSN: [id number]  Arrival date and time: 12/19/16 0740   First Provider Initiated Contact with Patient 12/19/16 0809      Chief Complaint  Patient presents with  . Abdominal Cramping   HPI Nicole Landry is a 31 y.o. [id number] at [redacted]w[redacted]d who presents complaining of lower abdominal cramping for 2 weeks. She rates the pain a 4/10 and has not tried any medication to help the pain. She reports a positive pregnancy test yesterday and is concerned for an ectopic because she had a ruptured ectopic in 2014. She denies abnormal discharge or vaginal bleeding. She reports irregular periods with an LMP in early May.   OB History    Gravida Para Term Preterm AB Living   5 2 2   2 2    SAB TAB Ectopic Multiple Live Births   1   1 0 2      Past Medical History:  Diagnosis Date  . Ectopic pregnancy, tubal   . Gestational diabetes   . Ovarian cyst   . Status post vacuum-assisted vaginal delivery (5/17) 11/20/2015    Past Surgical History:  Procedure Laterality Date  . BILATERAL SALPINGECTOMY Right 02/08/2013   Procedure:  SALPINGECTOMY;  Surgeon: Antionette Char, MD;  Location: [facility];  Service: Gynecology;  Laterality: Right;  . CESAREAN SECTION    . LAPAROSCOPY N/A 02/08/2013   Procedure: LAPAROSCOPY OPERATIVE;  Surgeon: Antionette Char, MD;  Location: [facility];  Service: Gynecology;  Laterality: N/A;  . right salpingectomy    . SALPINGECTOMY Right     History reviewed. No pertinent family history.  Social History  Substance Use Topics  . Smoking status: Never Smoker  . Smokeless tobacco: Never Used  . Alcohol use No    Allergies:  Allergies  Allergen Reactions  . 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 cantaloupe  . Vicodin [Hydrocodone-Acetaminophen] Other (See Comments)    Pt reports seizures, but pt says no problems with percocet    Prescriptions Prior to Admission  Medication Sig Dispense Refill Last Dose  . amoxicillin  (AMOXIL) 875 MG tablet Take 1 tablet (875 mg total) by mouth 2 (two) times daily. 16 tablet 0   . cetirizine (ZYRTEC) 10 MG tablet Take 1 tablet (10 mg total) by mouth daily. (Patient not taking: Reported on 10/21/2016) 30 tablet 11 Not Taking  . ferrous sulfate 325 (65 FE) MG tablet Take 325 mg by mouth 2 (two) times daily with a meal.   Taking  . ibuprofen (ADVIL,MOTRIN) 800 MG tablet Take 1 tablet (800 mg total) by mouth every 6 (six) hours. (Patient not taking: Reported on 10/21/2016) 45 tablet 0 Not Taking  . Prenatal Vit-Fe Fumarate-FA (PRENATAL MULTIVITAMIN) TABS tablet Take 1 tablet by mouth daily at 12 noon.   Taking  . pseudoephedrine (SUDAFED 12 HOUR) 120 MG 12 hr tablet Take 1 tablet (120 mg total) by mouth 2 (two) times daily. (Patient not taking: Reported on 10/21/2016) 30 tablet 3 Not Taking  . traMADol (ULTRAM) 50 MG tablet Take 1 tablet (50 mg total) by mouth every 6 (six) hours as needed for moderate pain. (Patient not taking: Reported on 02/19/2016) 30 tablet 0 Not Taking    Review of Systems  Constitutional: Negative.  Negative for activity change, fatigue and fever.  Respiratory: Negative.  Negative for shortness of breath.   Cardiovascular: Negative.  Negative for chest pain.  Gastrointestinal: Positive for abdominal pain. Negative for constipation,  diarrhea, nausea and vomiting.  Genitourinary: Negative.  Negative for vaginal bleeding and vaginal discharge.  Musculoskeletal: Negative.   Neurological: Negative.    Physical Exam   Blood pressure (!) 143/85, pulse 94, temperature 98.2 F (36.8 C), resp. rate 18, height 5\' 4"  (1.626 m), weight 208 lb (94.3 kg), last menstrual period 11/03/2016, unknown if currently breastfeeding.  Physical Exam  Nursing note and vitals reviewed. Constitutional: She appears well-developed and well-nourished.  HENT:  Head: Normocephalic and atraumatic.  Eyes: Conjunctivae are normal. No scleral icterus.  Cardiovascular: Normal rate, regular  rhythm and normal heart sounds.   Respiratory: Effort normal and breath sounds normal. No respiratory distress.  GI: Soft. She exhibits no distension. There is no tenderness.  Genitourinary: Uterus normal. Cervix exhibits discharge and friability. No tenderness in the vagina. Vaginal discharge found.  Neurological: She is alert.  Skin: Skin is warm and dry.  Psychiatric: She has a normal mood and affect. Her behavior is normal. Judgment and thought content normal.   Pelvic exam: Cervix pink, visually closed, without lesion, scant white creamy discharge and friability noted, vaginal walls and external genitalia normal Bimanual exam: Cervix 0/long/high, firm, posterior, neg CMT, uterus nontender, adnexa without tenderness, enlargement, or mass  MAU Course  Procedures Results for orders placed or performed during the hospital encounter of 12/19/16 (from the past 24 hour(s))  Urinalysis, Routine w reflex microscopic     Status: Abnormal   Collection Time: 12/19/16  7:53 AM  Result Value Ref Range   Color, Urine YELLOW YELLOW   APPearance CLOUDY (A) CLEAR   Specific Gravity, Urine 1.030 1.005 - 1.030   pH 5.0 5.0 - 8.0   Glucose, UA NEGATIVE NEGATIVE mg/dL   Hgb urine dipstick MODERATE (A) NEGATIVE   Bilirubin Urine NEGATIVE NEGATIVE   Ketones, ur NEGATIVE NEGATIVE mg/dL   Protein, ur 30 (A) NEGATIVE mg/dL   Nitrite POSITIVE (A) NEGATIVE   Leukocytes, UA LARGE (A) NEGATIVE   RBC / HPF 6-30 0 - 5 RBC/hpf   WBC, UA TOO NUMEROUS TO COUNT 0 - 5 WBC/hpf   Bacteria, UA MANY (A) NONE SEEN   Squamous Epithelial / LPF 6-30 (A) NONE SEEN   Mucous PRESENT   Pregnancy, urine POC     Status: Abnormal   Collection Time: 12/19/16  7:59 AM  Result Value Ref Range   Preg Test, Ur POSITIVE (A) NEGATIVE  Wet prep, genital     Status: Abnormal   Collection Time: 12/19/16  8:20 AM  Result Value Ref Range   Yeast Wet Prep HPF POC NONE SEEN NONE SEEN   Trich, Wet Prep NONE SEEN NONE SEEN   Clue Cells  Wet Prep HPF POC NONE SEEN NONE SEEN   WBC, Wet Prep HPF POC MANY (A) NONE SEEN   Sperm NONE SEEN   CBC     Status: Abnormal   Collection Time: 12/19/16  8:26 AM  Result Value Ref Range   WBC 5.9 4.0 - 10.5 K/uL   RBC 3.92 3.87 - 5.11 MIL/uL   Hemoglobin 10.8 (L) 12.0 - 15.0 g/dL   HCT 16.133.3 (L) 09.636.0 - 04.546.0 %   MCV 84.9 78.0 - 100.0 fL   MCH 27.6 26.0 - 34.0 pg   MCHC 32.4 30.0 - 36.0 g/dL   RDW 40.913.9 81.111.5 - 91.415.5 %   Platelets 337 150 - 400 K/uL  hCG, quantitative, pregnancy     Status: Abnormal   Collection Time: 12/19/16  8:26 AM  Result Value  Ref Range   hCG, Beta Chain, Quant, S 2,664 (H) <5 mIU/mL   US Ob Comp Less 14 Wks  Result Date: 12/19/2016 CLINICAL DATA:  First-trimester pregnancy. Intermittent cramping. Quantitative hCG = 2,664 EXAM: OBSTETRIC <14 WK [place] AND TRANSVAGINAL OB US TECHNIQUE: Both transabdominal and transvaginal ultrasound examinations were performed for complete evaluation of the gestation as well as the maternal uterus, adnexal regions, and pelvic cul-de-sac. Transvaginal technique was performed to assess early pregnancy. Cough COMPARISON:  None. FINDINGS: Intrauterine gestational sac: Single Yolk sac:  Not visualized Embryo:  Not visualized Cardiac Activity: Not visualized MSD: 5.0  mm   5 w   1  d Subchorionic hemorrhage:  None visualized. Maternal uterus/adnexae: Unremarkable. The uterus and adnexa are within normal limits. A small amount of free fluid is likely physiologic. IMPRESSION: Probable early intrauterine gestational sac, but no yolk sac, fetal pole, or cardiac activity yet visualized. Recommend follow-up quantitative B-HCG levels and follow-up US in 14 days to assess viability. This recommendation follows SRU consensus guidelines: Diagnostic Criteria for Nonviable Pregnancy Early in the First Trimester. Malva Limes Med 2013; 161:0960-45. This is Electronically Signed   By: Marin Roberts M.D.   On: 12/19/2016 09:37   US Ob Transvaginal  Result Date:  12/19/2016 CLINICAL DATA:  First-trimester pregnancy. Intermittent cramping. Quantitative hCG = 2,664 EXAM: OBSTETRIC <14 WK [place] AND TRANSVAGINAL OB US TECHNIQUE: Both transabdominal and transvaginal ultrasound examinations were performed for complete evaluation of the gestation as well as the maternal uterus, adnexal regions, and pelvic cul-de-sac. Transvaginal technique was performed to assess early pregnancy. Cough COMPARISON:  None. FINDINGS: Intrauterine gestational sac: Single Yolk sac:  Not visualized Embryo:  Not visualized Cardiac Activity: Not visualized MSD: 5.0  mm   5 w   1  d Subchorionic hemorrhage:  None visualized. Maternal uterus/adnexae: Unremarkable. The uterus and adnexa are within normal limits. A small amount of free fluid is likely physiologic. IMPRESSION: Probable early intrauterine gestational sac, but no yolk sac, fetal pole, or cardiac activity yet visualized. Recommend follow-up quantitative B-HCG levels and follow-up US in 14 days to assess viability. This recommendation follows SRU consensus guidelines: Diagnostic Criteria for Nonviable Pregnancy Early in the First Trimester. Malva Limes Med 2013; 409:8119-14. This is Electronically Signed   By: Marin Roberts M.D.   On: 12/19/2016 09:37   MDM UA, UPT, wet prep and gc/chlamydia, cbc, quantitative Hcg, HIV Ultrasound Urine culture  Assessment and Plan   1. Abdominal pain affecting pregnancy   2. Urinary tract infection affecting pregnancy   3. Pregnancy of unknown anatomic location    -Discharge patient home in stable condition.  -Follow up beta Hcg on 6/18 in [facility] hospital clinic. -Prescription for keflex for UTI and culture pending.  -Encouraged patient to start prenatal care. -Encouraged to return here or to other Urgent Care/ED if she develops worsening of symptoms, increase in pain, fever, or other concerning symptoms.   Cleone Slim SNM 12/19/2016, 9:41 AM   Follow-up Information    [facility] OUTPATIENT  CLINIC Follow up in 2 day(s).   Why:  for repeat blood work  Contact information: [address] [place] [address] [phone]           I confirm that I have verified the information documented in the CNM student's note and that I have also personally performed the physical exam and all medical decision making activities. Ultrasound shows probable IUGS, no yolk sac, no adnexal mass.  Pt is to  return to [facility] on Monday morning for repeat BHCG. Patient deemed reliable. Strict return precautions given.   Judeth Horn, NP

## 2016-12-19 NOTE — MAU Note (Signed)
Pt presents to MAU with complaints of lower abdominal cramping on and off for a couple of weeks. Positive pregnancy test yesterday. History of ectopic in 2014. Denies any VB or abnormal discharge

## 2016-12-21 ENCOUNTER — Ambulatory Visit: Payer: BLUE CROSS/BLUE SHIELD

## 2016-12-21 ENCOUNTER — Telehealth: Payer: Self-pay

## 2016-12-21 ENCOUNTER — Encounter: Payer: Self-pay | Admitting: Obstetrics and Gynecology

## 2016-12-21 LAB — GC/CHLAMYDIA PROBE AMP (~~LOC~~) NOT AT ARMC
CHLAMYDIA, DNA PROBE: NEGATIVE
Neisseria Gonorrhea: NEGATIVE

## 2016-12-21 LAB — CULTURE, OB URINE: Culture: >=100000 — AB

## 2016-12-21 NOTE — Telephone Encounter (Signed)
Contacted pt in regards to missed STAT beta appt.  Pt stated that she forgot to call but would like to come in tomorrow @ 0800 for the lab draw.  I advised pt that would fine but to know that she may have to be here for two hours after her lab draw.  Pt stated understanding with no further questions.

## 2016-12-22 ENCOUNTER — Encounter: Payer: Self-pay | Admitting: Student

## 2016-12-22 ENCOUNTER — Ambulatory Visit: Payer: BLUE CROSS/BLUE SHIELD

## 2016-12-22 DIAGNOSIS — O3680X0 Pregnancy with inconclusive fetal viability, not applicable or unspecified: Secondary | ICD-10-CM

## 2016-12-22 DIAGNOSIS — O2341 Unspecified infection of urinary tract in pregnancy, first trimester: Secondary | ICD-10-CM | POA: Insufficient documentation

## 2016-12-22 LAB — HCG, QUANTITATIVE, PREGNANCY: hCG, Beta Chain, Quant, S: 6891 m[IU]/mL — ABNORMAL HIGH (ref <5)

## 2016-12-22 NOTE — Progress Notes (Signed)
Patient presented to office today for BHCG. Patient reports having no pain or bleeding at this time. She has been advised to wait for her results in our office. Per Dr.Dove patient should repeat quant in 48 hours to make sure it is still continuing to rise at a good level. Patient voice understanding at this time.

## 2016-12-24 ENCOUNTER — Other Ambulatory Visit: Payer: BLUE CROSS/BLUE SHIELD

## 2016-12-24 DIAGNOSIS — Z3A01 Less than 8 weeks gestation of pregnancy: Secondary | ICD-10-CM | POA: Diagnosis not present

## 2016-12-24 NOTE — Progress Notes (Signed)
Patient presented to office today for repeat bhcg. Patient currently is not having any pain or bleeding at this time. I have advised patient we will call her once her results come back.

## 2016-12-25 ENCOUNTER — Telehealth: Payer: Self-pay

## 2016-12-25 DIAGNOSIS — Z3201 Encounter for pregnancy test, result positive: Secondary | ICD-10-CM

## 2016-12-25 LAB — BETA HCG QUANT (REF LAB): HCG QUANT: 7503 m[IU]/mL

## 2016-12-25 NOTE — Telephone Encounter (Signed)
P 

## 2016-12-25 NOTE — Telephone Encounter (Signed)
Spoke with patient regarding bhcg results. Patient has been informed that her Nicole Landry is rising at a appropriate number. Per Dr.Anyanwu patient should have a follow up ultrasound for viabilty. I have scheduled this appointment for 01/01/2017 @ 2:00pm. Patient has been informed of appointment day and time.

## 2016-12-29 ENCOUNTER — Telehealth: Payer: Self-pay | Admitting: General Practice

## 2016-12-29 NOTE — Telephone Encounter (Signed)
Patient called into front office reporting spotting only when she wipes for an hour yesterday and now this morning she is spotting brown but only when she wipes. Patient denies pain. Discussed with patient that spotting can be normal in the first trimester but heavy bleeding like a period or passage of clots would be concerning. Patient verbalized understanding and had no other questions

## 2017-01-01 ENCOUNTER — Ambulatory Visit (HOSPITAL_COMMUNITY)
Admission: RE | Admit: 2017-01-01 | Discharge: 2017-01-01 | Disposition: A | Discharge status: Home / Self Care | Payer: BLUE CROSS/BLUE SHIELD | Source: Ambulatory Visit | Attending: Obstetrics & Gynecology | Admitting: Obstetrics & Gynecology

## 2017-01-01 DIAGNOSIS — Z3A01 Less than 8 weeks gestation of pregnancy: Secondary | ICD-10-CM | POA: Insufficient documentation

## 2017-01-01 DIAGNOSIS — O3680X0 Pregnancy with inconclusive fetal viability, not applicable or unspecified: Secondary | ICD-10-CM | POA: Diagnosis not present

## 2017-01-01 DIAGNOSIS — Z3201 Encounter for pregnancy test, result positive: Secondary | ICD-10-CM | POA: Insufficient documentation

## 2017-01-29 LAB — OB RESULTS CONSOLE ABO/RH: RH Type: POSITIVE

## 2017-01-29 LAB — OB RESULTS CONSOLE HEPATITIS B SURFACE ANTIGEN: Hepatitis B Surface Ag: NEGATIVE

## 2017-01-29 LAB — OB RESULTS CONSOLE RPR: RPR: NONREACTIVE

## 2017-01-29 LAB — OB RESULTS CONSOLE RUBELLA ANTIBODY, IGM: RUBELLA: IMMUNE

## 2017-01-29 LAB — OB RESULTS CONSOLE HIV ANTIBODY (ROUTINE TESTING): HIV: NONREACTIVE

## 2017-01-29 LAB — OB RESULTS CONSOLE GC/CHLAMYDIA
Chlamydia: NEGATIVE
GC PROBE AMP, GENITAL: NEGATIVE

## 2017-02-19 DIAGNOSIS — Z3A13 13 weeks gestation of pregnancy: Secondary | ICD-10-CM | POA: Diagnosis not present

## 2017-02-19 DIAGNOSIS — N390 Urinary tract infection, site not specified: Secondary | ICD-10-CM | POA: Diagnosis not present

## 2017-02-19 DIAGNOSIS — O2391 Unspecified genitourinary tract infection in pregnancy, first trimester: Secondary | ICD-10-CM | POA: Diagnosis not present

## 2017-02-23 DIAGNOSIS — O9229 Other disorders of breast associated with pregnancy and the puerperium: Secondary | ICD-10-CM | POA: Diagnosis not present

## 2017-02-23 DIAGNOSIS — Z3A14 14 weeks gestation of pregnancy: Secondary | ICD-10-CM | POA: Diagnosis not present

## 2017-03-10 DIAGNOSIS — Z361 Encounter for antenatal screening for raised alphafetoprotein level: Secondary | ICD-10-CM | POA: Diagnosis not present

## 2017-03-10 DIAGNOSIS — N390 Urinary tract infection, site not specified: Secondary | ICD-10-CM | POA: Diagnosis not present

## 2017-03-26 DIAGNOSIS — Z363 Encounter for antenatal screening for malformations: Secondary | ICD-10-CM | POA: Diagnosis not present

## 2017-04-13 DIAGNOSIS — Z3A21 21 weeks gestation of pregnancy: Secondary | ICD-10-CM | POA: Diagnosis not present

## 2017-04-13 DIAGNOSIS — Z362 Encounter for other antenatal screening follow-up: Secondary | ICD-10-CM | POA: Diagnosis not present

## 2017-04-13 DIAGNOSIS — O2340 Unspecified infection of urinary tract in pregnancy, unspecified trimester: Secondary | ICD-10-CM | POA: Diagnosis not present

## 2017-04-13 DIAGNOSIS — O2392 Unspecified genitourinary tract infection in pregnancy, second trimester: Secondary | ICD-10-CM | POA: Diagnosis not present

## 2017-05-17 DIAGNOSIS — Z3480 Encounter for supervision of other normal pregnancy, unspecified trimester: Secondary | ICD-10-CM | POA: Diagnosis not present

## 2017-05-17 DIAGNOSIS — O3663X0 Maternal care for excessive fetal growth, third trimester, not applicable or unspecified: Secondary | ICD-10-CM | POA: Diagnosis not present

## 2017-05-17 DIAGNOSIS — Z3A25 25 weeks gestation of pregnancy: Secondary | ICD-10-CM | POA: Diagnosis not present

## 2017-05-25 ENCOUNTER — Other Ambulatory Visit: Payer: Self-pay

## 2017-05-25 ENCOUNTER — Emergency Department (HOSPITAL_COMMUNITY): Payer: BLUE CROSS/BLUE SHIELD

## 2017-05-25 ENCOUNTER — Encounter (HOSPITAL_COMMUNITY): Payer: Self-pay | Admitting: Emergency Medicine

## 2017-05-25 ENCOUNTER — Inpatient Hospital Stay (HOSPITAL_COMMUNITY)
Admission: EM | Admit: 2017-05-25 | Discharge: 2017-05-25 | Disposition: A | Discharge status: Home / Self Care | Payer: BLUE CROSS/BLUE SHIELD | Attending: Obstetrics | Admitting: Obstetrics

## 2017-05-25 DIAGNOSIS — Z3A28 28 weeks gestation of pregnancy: Secondary | ICD-10-CM | POA: Diagnosis not present

## 2017-05-25 DIAGNOSIS — S161XXA Strain of muscle, fascia and tendon at neck level, initial encounter: Secondary | ICD-10-CM | POA: Diagnosis not present

## 2017-05-25 DIAGNOSIS — O2690 Pregnancy related conditions, unspecified, unspecified trimester: Secondary | ICD-10-CM | POA: Diagnosis not present

## 2017-05-25 DIAGNOSIS — Z3493 Encounter for supervision of normal pregnancy, unspecified, third trimester: Secondary | ICD-10-CM | POA: Diagnosis not present

## 2017-05-25 DIAGNOSIS — O9989 Other specified diseases and conditions complicating pregnancy, childbirth and the puerperium: Secondary | ICD-10-CM | POA: Diagnosis not present

## 2017-05-25 DIAGNOSIS — Z041 Encounter for examination and observation following transport accident: Secondary | ICD-10-CM | POA: Diagnosis not present

## 2017-05-25 DIAGNOSIS — O26893 Other specified pregnancy related conditions, third trimester: Secondary | ICD-10-CM | POA: Diagnosis not present

## 2017-05-25 DIAGNOSIS — M542 Cervicalgia: Secondary | ICD-10-CM | POA: Diagnosis not present

## 2017-05-25 HISTORY — DX: Gestational diabetes mellitus in pregnancy, unspecified control: O24.419

## 2017-05-25 MED ORDER — ONDANSETRON HCL 4 MG/2ML IJ SOLN
4.0000 mg | Freq: Once | INTRAMUSCULAR | Status: AC
  Administered 2017-05-25: 4 mg via INTRAVENOUS
  Filled 2017-05-25: qty 2

## 2017-05-25 MED ORDER — ACETAMINOPHEN 325 MG PO TABS
650.0000 mg | ORAL_TABLET | Freq: Once | ORAL | Status: AC
Start: 2017-05-25 — End: 2017-05-25
  Administered 2017-05-25: 650 mg via ORAL
  Filled 2017-05-25: qty 2
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
## 2017-05-25 NOTE — ED Notes (Signed)
OB Rapid Response RN at bedside. 
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
## 2017-05-25 NOTE — Progress Notes (Signed)
Pt to X Ray.

## 2017-05-25 NOTE — ED Notes (Signed)
[facility] contacted to [facility]; Dr. Noland FordyceKelly Fogleman receiving

## 2017-05-25 NOTE — Progress Notes (Signed)
Pt back from Xray.  Monitors applied for continuous monitoring per OB order.
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
## 2017-05-25 NOTE — Progress Notes (Addendum)
[facility] here to transport pt [facility] for continuous monitoring.  [facility] MA[facility] updated on pt arrival.  Spoke with Dabney.  Dr Ernestina PennaFogleman updated on pt transfer.
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
## 2017-05-25 NOTE — MAU Note (Signed)
Water given for PO hydration, per Dr. Ernestina PennaFogleman.

## 2017-05-26 ENCOUNTER — Encounter: Payer: Self-pay | Admitting: Obstetrics and Gynecology

## 2017-05-26 ENCOUNTER — Encounter (HOSPITAL_COMMUNITY): Payer: Self-pay

## 2017-06-28 ENCOUNTER — Inpatient Hospital Stay (HOSPITAL_COMMUNITY)
Admission: AD | Admit: 2017-06-28 | Discharge: 2017-06-30 | DRG: 832 | Disposition: A | Discharge status: Home / Self Care | Payer: BLUE CROSS/BLUE SHIELD | Source: Ambulatory Visit | Attending: Obstetrics | Admitting: Obstetrics

## 2017-06-28 ENCOUNTER — Inpatient Hospital Stay (HOSPITAL_COMMUNITY): Payer: BLUE CROSS/BLUE SHIELD

## 2017-06-28 ENCOUNTER — Other Ambulatory Visit: Payer: Self-pay

## 2017-06-28 ENCOUNTER — Encounter (HOSPITAL_COMMUNITY): Payer: Self-pay | Admitting: Emergency Medicine

## 2017-06-28 DIAGNOSIS — R109 Unspecified abdominal pain: Secondary | ICD-10-CM | POA: Diagnosis not present

## 2017-06-28 DIAGNOSIS — A419 Sepsis, unspecified organism: Secondary | ICD-10-CM

## 2017-06-28 DIAGNOSIS — Z8632 Personal history of gestational diabetes: Secondary | ICD-10-CM | POA: Diagnosis not present

## 2017-06-28 DIAGNOSIS — O99013 Anemia complicating pregnancy, third trimester: Secondary | ICD-10-CM | POA: Diagnosis present

## 2017-06-28 DIAGNOSIS — O34219 Maternal care for unspecified type scar from previous cesarean delivery: Secondary | ICD-10-CM | POA: Diagnosis present

## 2017-06-28 DIAGNOSIS — O2303 Infections of kidney in pregnancy, third trimester: Secondary | ICD-10-CM | POA: Diagnosis present

## 2017-06-28 DIAGNOSIS — Z3A32 32 weeks gestation of pregnancy: Secondary | ICD-10-CM | POA: Diagnosis not present

## 2017-06-28 DIAGNOSIS — O3663X0 Maternal care for excessive fetal growth, third trimester, not applicable or unspecified: Secondary | ICD-10-CM | POA: Diagnosis present

## 2017-06-28 DIAGNOSIS — N10 Acute tubulo-interstitial nephritis: Secondary | ICD-10-CM | POA: Diagnosis present

## 2017-06-28 DIAGNOSIS — D649 Anemia, unspecified: Secondary | ICD-10-CM | POA: Diagnosis present

## 2017-06-28 LAB — RAPID URINE DRUG SCREEN, HOSP PERFORMED
Amphetamines: NOT DETECTED
BENZODIAZEPINES: NOT DETECTED
Barbiturates: NOT DETECTED
Cocaine: NOT DETECTED
OPIATES: NOT DETECTED
Tetrahydrocannabinol: NOT DETECTED

## 2017-06-28 LAB — URINALYSIS, ROUTINE W REFLEX MICROSCOPIC
Bilirubin Urine: NEGATIVE
Glucose, UA: NEGATIVE mg/dL
Ketones, ur: NEGATIVE mg/dL
Nitrite: NEGATIVE
PH: 7 (ref 5.0–8.0)
Protein, ur: NEGATIVE mg/dL
SPECIFIC GRAVITY, URINE: 1.012 (ref 1.005–1.030)

## 2017-06-28 LAB — CBC WITH DIFFERENTIAL/PLATELET
Basophils Absolute: 0 10*3/uL (ref 0.0–0.1)
Basophils Relative: 0 %
EOS ABS: 0 10*3/uL (ref 0.0–0.7)
EOS PCT: 0 %
HCT: 34 % — ABNORMAL LOW (ref 36.0–46.0)
HEMOGLOBIN: 10.9 g/dL — AB (ref 12.0–15.0)
LYMPHS ABS: 0.5 10*3/uL — AB (ref 0.7–4.0)
Lymphocytes Relative: 3 %
MCH: 28 pg (ref 26.0–34.0)
MCHC: 32.1 g/dL (ref 30.0–36.0)
MCV: 87.4 fL (ref 78.0–100.0)
MONOS PCT: 3 %
Monocytes Absolute: 0.5 10*3/uL (ref 0.1–1.0)
Neutro Abs: 15.5 10*3/uL — ABNORMAL HIGH (ref 1.7–7.7)
Neutrophils Relative %: 94 %
PLATELETS: 231 10*3/uL (ref 150–400)
RBC: 3.89 MIL/uL (ref 3.87–5.11)
RDW: 13.5 % (ref 11.5–15.5)
WBC: 16.5 10*3/uL — ABNORMAL HIGH (ref 4.0–10.5)

## 2017-06-28 LAB — PROCALCITONIN: Procalcitonin: 0.14 ng/mL

## 2017-06-28 LAB — COMPREHENSIVE METABOLIC PANEL
ALK PHOS: 78 U/L (ref 38–126)
ALT: 11 U/L — ABNORMAL LOW (ref 14–54)
ANION GAP: 12 (ref 5–15)
AST: 18 U/L (ref 15–41)
Albumin: 3.2 g/dL — ABNORMAL LOW (ref 3.5–5.0)
BUN: 7 mg/dL (ref 6–20)
CALCIUM: 9.5 mg/dL (ref 8.9–10.3)
CO2: 20 mmol/L — AB (ref 22–32)
Chloride: 105 mmol/L (ref 101–111)
Creatinine, Ser: 0.44 mg/dL (ref 0.44–1.00)
Glucose, Bld: 137 mg/dL — ABNORMAL HIGH (ref 65–99)
Potassium: 3.3 mmol/L — ABNORMAL LOW (ref 3.5–5.1)
SODIUM: 137 mmol/L (ref 135–145)
TOTAL PROTEIN: 6.8 g/dL (ref 6.5–8.1)
Total Bilirubin: 0.3 mg/dL (ref 0.3–1.2)

## 2017-06-28 LAB — CBC
HCT: 32.4 % — ABNORMAL LOW (ref 36.0–46.0)
Hemoglobin: 10.4 g/dL — ABNORMAL LOW (ref 12.0–15.0)
MCH: 28 pg (ref 26.0–34.0)
MCHC: 32.1 g/dL (ref 30.0–36.0)
MCV: 87.3 fL (ref 78.0–100.0)
PLATELETS: 224 10*3/uL (ref 150–400)
RBC: 3.71 MIL/uL — ABNORMAL LOW (ref 3.87–5.11)
RDW: 13.9 % (ref 11.5–15.5)
WBC: 13.2 10*3/uL — AB (ref 4.0–10.5)

## 2017-06-28 LAB — GLUCOSE, CAPILLARY
Glucose-Capillary: 104 mg/dL — ABNORMAL HIGH (ref 65–99)
Glucose-Capillary: 83 mg/dL (ref 65–99)

## 2017-06-28 LAB — TYPE AND SCREEN
ABO/RH(D): O POS
Antibody Screen: NEGATIVE

## 2017-06-28 LAB — PROTIME-INR
INR: 1.07
Prothrombin Time: 13.8 seconds (ref 11.4–15.2)

## 2017-06-28 LAB — APTT: aPTT: 25 seconds (ref 24–36)

## 2017-06-28 LAB — LACTIC ACID, PLASMA: LACTIC ACID, VENOUS: 1.9 mmol/L (ref 0.5–1.9)

## 2017-06-28 LAB — HIV ANTIBODY (ROUTINE TESTING W REFLEX): HIV SCREEN 4TH GENERATION: NONREACTIVE

## 2017-06-28 MED ORDER — HYDROMORPHONE HCL 2 MG/ML IJ SOLN
2.0000 mg | INTRAMUSCULAR | Status: DC | PRN
  Administered 2017-06-28 – 2017-06-29 (×6): 2 mg via INTRAVENOUS
  Filled 2017-06-28 (×7): qty 1

## 2017-06-28 MED ORDER — HYDROMORPHONE HCL 1 MG/ML IJ SOLN
2.0000 mg | Freq: Once | INTRAMUSCULAR | Status: AC
  Administered 2017-06-28: 2 mg via INTRAVENOUS
  Filled 2017-06-28: qty 2

## 2017-06-28 MED ORDER — SODIUM CHLORIDE 0.9 % IV BOLUS (SEPSIS)
500.0000 mL | Freq: Once | INTRAVENOUS | Status: AC
  Administered 2017-06-28: 500 mL via INTRAVENOUS

## 2017-06-28 MED ORDER — ACETAMINOPHEN 325 MG PO TABS
650.0000 mg | ORAL_TABLET | ORAL | Status: DC | PRN
  Administered 2017-06-28 (×2): 650 mg via ORAL
  Filled 2017-06-28 (×2): qty 2

## 2017-06-28 MED ORDER — DOCUSATE SODIUM 100 MG PO CAPS
100.0000 mg | ORAL_CAPSULE | Freq: Every day | ORAL | Status: DC
  Filled 2017-06-28: qty 1

## 2017-06-28 MED ORDER — SODIUM CHLORIDE 0.9 % IV SOLN
INTRAVENOUS | Status: DC
  Administered 2017-06-28 – 2017-06-30 (×6): via INTRAVENOUS

## 2017-06-28 MED ORDER — ZOLPIDEM TARTRATE 5 MG PO TABS: 5.0000 mg | ORAL_TABLET | Freq: Every evening | ORAL | Status: DC | PRN

## 2017-06-28 MED ORDER — SODIUM CHLORIDE 0.9 % IV BOLUS (SEPSIS): 1000.0000 mL | Freq: Once | INTRAVENOUS | Status: DC

## 2017-06-28 MED ORDER — DEXTROSE 5 % IV SOLN
2.0000 g | Freq: Once | INTRAVENOUS | Status: AC
  Administered 2017-06-28: 2 g via INTRAVENOUS
  Filled 2017-06-28: qty 2

## 2017-06-28 MED ORDER — SODIUM CHLORIDE 0.9 % IV SOLN
8.0000 mg | Freq: Two times a day (BID) | INTRAVENOUS | Status: DC | PRN
  Administered 2017-06-28: 8 mg via INTRAVENOUS
  Filled 2017-06-28: qty 4

## 2017-06-28 MED ORDER — PRENATAL MULTIVITAMIN CH
1.0000 | ORAL_TABLET | Freq: Every day | ORAL | Status: DC
  Filled 2017-06-28: qty 1

## 2017-06-28 MED ORDER — CALCIUM CARBONATE ANTACID 500 MG PO CHEW: 2.0000 | CHEWABLE_TABLET | ORAL | Status: DC | PRN

## 2017-06-28 MED ORDER — DEXTROSE 5 % IV SOLN
2.0000 g | INTRAVENOUS | Status: DC
  Administered 2017-06-29 – 2017-06-30 (×2): 2 g via INTRAVENOUS
  Filled 2017-06-28 (×2): qty 2

## 2017-06-28 MED ORDER — SODIUM CHLORIDE 0.9 % IV SOLN
8.0000 mg | Freq: Two times a day (BID) | INTRAVENOUS | Status: DC
  Administered 2017-06-28: 8 mg via INTRAVENOUS
  Filled 2017-06-28: qty 4

## 2017-06-28 MED ORDER — OXYCODONE-ACETAMINOPHEN 5-325 MG PO TABS
1.0000 | ORAL_TABLET | ORAL | Status: DC | PRN
  Administered 2017-06-29 – 2017-06-30 (×4): 2 via ORAL
  Filled 2017-06-28 (×4): qty 2

## 2017-06-28 NOTE — Progress Notes (Signed)
HD#1  Pt notes continued back pain, right sided, relief with Dilaudid but only for about 2 hrs. Pt notes nausea. Emesis x 2 this am. Poor appetite and po intake. No fevers. Pt notes R flank hurts when taking a deep breath but no CP or SOB. No VB, no contractions, good FM.   PE: Vitals:   06/28/17 0400 06/28/17 0500 06/28/17 0654 06/28/17 0801  BP:    125/68  Pulse:    (!) 110  Resp:    18  Temp:    98.2 F (36.8 C)  TempSrc:    Oral  SpO2: 96%  95% 95%  Weight:  111.6 kg (246 lb)    Height:  5\' 3"  (1.6 m)      Gen: lying in bed, heat pad on R flank, appears uncomfortable CV: RRR Pulm: CTAB. No crackles Abd: gravid, NT, no fundal tenderness, no RUQ pain Back: R CVAT present GU: deferred- cvx closed last pm LE: NT, no edema  Toco: no ctx. FH: 150s, + accels, no decels, 10 beat var  CBC    Component Value Date/Time   WBC 13.2 (H) 06/28/2017 0535   RBC 3.71 (L) 06/28/2017 0535   HGB 10.4 (L) 06/28/2017 0535   HCT 32.4 (L) 06/28/2017 0535   PLT 224 06/28/2017 0535   MCV 87.3 06/28/2017 0535   MCH 28.0 06/28/2017 0535   MCHC 32.1 06/28/2017 0535   RDW 13.9 06/28/2017 0535   LYMPHSABS 0.5 (L) 06/28/2017 0129   MONOABS 0.5 06/28/2017 0129   EOSABS 0.0 06/28/2017 0129   BASOSABS 0.0 06/28/2017 0129    UCx pending  A/P: 31 yo G7P4023 at 32'6 with R sided pyelonephritis in the setting of recurrent UTI and non-compliance with medications. Pain control, Zofran for nausea, Ceftriaxone 2g IV q24 hrs. Await urine and blood cx.. If not improving will plan renal u/s  - Follow BS in this pt with h/o GDM and has not had glucola - Fetal growth u/s.  - Case management involved, will help pt obtain outpt meds.   Lendon ColonelKelly A Nemesio Castrillon 06/28/2017 11:56 AM

## 2017-06-28 NOTE — Progress Notes (Signed)
Pt. Observed to be crying with 9/10 pain. Call to Dr. Ernestina PennaFogleman. PRN dilaudid order entered.

## 2017-06-28 NOTE — Care Management Note (Signed)
Case Management Note  Patient Details  Name: Nicole Landry MRN: [id number] Date of Birth: 09-27-1985  Subjective/Objective:              pyleonephritis      Action/Plan: Medications needs    In-House Referral:   financial counselor       Additional Comments: CM spoke to Dr. Ernestina PennaFogleman regarding potential dc date and discharge needs for patient.   Possibly Wed or Thursday of this week.  Per MD patient was unable to afford her medications prior to this admission.  CM went to see patient and patient currently does not have insurance.  Patient said that she will have insurance as of 07-06-17.  She said that her medicine was over 43100$ and her 482 year old daughter's medicine was over 100$ also and she purchased her daughter's medicine instead of hers.  [facility] and explained this to patient and Dr Ernestina PennaFogleman that her medicine would be $3 each for antibiotics but not over the counter or pain medicine.  Patient plans to use [facility] at discharge.  Because patient is not ready for discharge and unclear when going home Match form will need to be filled out by CM when ready for discharge.  Please call #[phone] when ready for discharge.   Geoffery LyonsGaines, Emyah Roznowski Brown, RN 06/28/2017, 11:59 AM

## 2017-06-28 NOTE — H&P (Signed)
Nicole Landry is a 31 y.o. [phone] at 6763w6d presenting for back and abdominal pain. Please see MAU CNM note for further details.   Pt with h/o recurrent UTI, was prescribed daily Macrobid for suppression, but pt has stopped taking.    [facility] at [facility] since 6 wks - Dated by 6 wk u/s - Unplanned pregnancy - Missed appointments. Pt has not been seen in 7 wks, since her 25th week of pregnancy, office staff have had difficulty contacting patient. Pt states lack of visits is due to current lack of insurance and pt has appt scheduled for Jan 3. - h/o recurrent UTI. Pt with documented UTI 7/27, 8/17, 9/5 ad 10/9. All UTI  Were E.Coli and all with resistance to Ampicillin but otherwise pan-SN. Pt not complying with the prescribed antibiotics. Pt has never had a clean UCx in this pregnancy - h/o GDM in prior pregnancy. Early DS 132, pt has not been to the office since 25 wks  - Planning VBAC (prior successful VBAC, baby 8'2).   Prenatal Transfer Tool  Maternal Diabetes: No Genetic Screening: Declined Maternal Ultrasounds/Referrals: Normal Fetal Ultrasounds or other Referrals:  None Maternal Substance Abuse:  No Significant Maternal Medications:  None Significant Maternal Lab Results: Lab values include: Other:      OB History    Gravida Para Term Preterm AB Living   7 4 4  0 2 3   SAB TAB Ectopic Multiple Live Births   1 0 1   3     Past Medical History:  Diagnosis Date  . Ectopic pregnancy, tubal   . Gestational diabetes   . Gestational diabetes mellitus 2011   denies gestational DM with current pregnancy  . Ovarian cyst   . Status post vacuum-assisted vaginal delivery (5/17) 11/20/2015   Past Surgical History:  Procedure Laterality Date  . BILATERAL SALPINGECTOMY Right 02/08/2013   Procedure:  SALPINGECTOMY;  Surgeon: Antionette CharLisa Jackson-Moore, MD;  Location: [facility];  Service: Gynecology;  Laterality: Right;  . CESAREAN SECTION    . ceserean section    . LAPAROSCOPY N/A  02/08/2013   Procedure: LAPAROSCOPY OPERATIVE;  Surgeon: Antionette CharLisa Jackson-Moore, MD;  Location: [facility];  Service: Gynecology;  Laterality: N/A;  . SALPINGECTOMY Right 02/2013   op note in epic   Family History: family history is not on file. Social History:  reports that  has never smoked. she has never used smokeless tobacco. She reports that she does not drink alcohol or use drugs.  Review of Systems - History obtained from chart review and MAU provider     Blood pressure 123/75, pulse (!) 152, temperature 99.4 F (37.4 C), temperature source Oral, resp. rate 18, height 5\' 3"  (1.6 m), weight 111.6 kg (246 lb), last menstrual period 11/03/2016, unknown if currently breastfeeding.  Physical Exam: per MAU provider Gen: "appears sick but not septic) Back: b/l CVAT, R>L cvx check: pending   Toco: none FH: baseline 160s initially then back to 150's, accelerations present, no deceleratons, 10 beat variability  Prenatal labs: ABO, Rh:  O+ Antibody:  neg Rubella:  immune RPR:   NP HBsAg:   neg HIV:   neg GBS:   not yet done 1 hr Glucola not yet done  Genetic screening declined Anatomy US normal   Assessment/Plan: 31 y.o. [id number] at 4663w6d - pyelonephritis. Given high HR, sepsis protocol initiated. IV fluids given. Will not continue to IV bolus due to risk of pulmonary edema with pyelonephritis. Expect HR to decrease with pain control  and IV abx. Ceftriaxone started and will continued 2g IV q 24 hrs. Blood and urine cultures sent. If HR remains high or bp drops or any other signs of sepsis will increase IV fliuds. If pain does not decrease over 24 hr will check renal u/s to eval for stone/ obstruction. - Poor PN care. Pt states lack of health insurance as reason for not being seen in office x 7 wks. May need SW consult and medication assistance. Pt is overdue for her f/u fetal u/s. As she has not had her diabetic testing, and a prior preg with GDM, will follow BS. Would not do DS at this time  due to stress of infection. Check UDS, GC/CT.  - Fetal testing. CUrrent NST reactive, will cont qshift if initial testing remains reactive. At risk for PTL, will watc closely. - Anemia, expect worsening with pyelo, will start iron.  - Planning VBAC (prior successful VBAC, baby 8'2)   Nicole Landry 06/28/2017, 2:58 AM

## 2017-06-28 NOTE — MAU Provider Note (Signed)
History     CSN: [id number]  Arrival date and time: 06/28/17 0028   First Provider Initiated Contact with Patient 06/28/17 0118      Chief Complaint  Patient presents with  . Back Pain  . Nausea   HPI  HPI: Nicole Landry is a 31 y.o. year old [id number] female at 8061w6d weeks gestation who presents to MAU reporting RT flank pain that started about 1830 last evening. She reports having chronic UTIs taking Macorbid qod d/t cost and change of insurance coverage. She started having flank pain all day and increased in intensity at 1830.  She also complains of N/V. [facility]Dr[facility] Juliene PinaMody). Her last appt was in November. She has not been back to [facility] d/t change of insurance that will start in January. She has an appt scheduled in January.  Past Medical History:  Diagnosis Date  . Ectopic pregnancy, tubal   . Gestational diabetes   . Gestational diabetes mellitus 2011   denies gestational DM with current pregnancy  . Ovarian cyst   . Status post vacuum-assisted vaginal delivery (5/17) 11/20/2015    Past Surgical History:  Procedure Laterality Date  . BILATERAL SALPINGECTOMY Right 02/08/2013   Procedure:  SALPINGECTOMY;  Surgeon: Antionette CharLisa Jackson-Moore, MD;  Location: [facility];  Service: Gynecology;  Laterality: Right;  . CESAREAN SECTION    . ceserean section    . LAPAROSCOPY N/A 02/08/2013   Procedure: LAPAROSCOPY OPERATIVE;  Surgeon: Antionette CharLisa Jackson-Moore, MD;  Location: [facility];  Service: Gynecology;  Laterality: N/A;  . SALPINGECTOMY Right 02/2013   op note in epic    History reviewed. No pertinent family history.  Social History   Tobacco Use  . Smoking status: Never Smoker  . Smokeless tobacco: Never Used  Substance Use Topics  . Alcohol use: No    Frequency: Never  . Drug use: No    Allergies:  Allergies  Allergen Reactions  . 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    . Other Anaphylaxis    Melon  . Vicodin  [Hydrocodone-Acetaminophen]     Patient reports having a seizure following taking Vicodin in the past, has not taken since   . Vicodin [Hydrocodone-Acetaminophen] Other (See Comments)    Reaction:  Seizures     Medications Prior to Admission  Medication Sig Dispense Refill Last Dose  . cephALEXin (KEFLEX) 500 MG capsule Take 1 capsule (500 mg total) by mouth 4 (four) times daily. (Patient not taking: Reported on 12/22/2016) 28 capsule 0 Not Taking  . IRON PO Take 1 tablet by mouth daily.   05/25/2017 at Unknown time  . nitrofurantoin (MACRODANTIN) 50 MG capsule Take 1 capsule by mouth daily.  4 05/25/2017 at Unknown time  . Prenatal MV-Min-FA-Omega-3 (PRENATAL GUMMIES/DHA & FA) 0.4-32.5 MG CHEW Chew 2 each by mouth daily.   Taking  . Prenatal Vit-Fe Fumarate-FA (PRENATAL MULTIVITAMIN) TABS tablet Take 1 tablet by mouth daily at 12 noon.   05/25/2017 at Unknown time    Review of Systems  Constitutional: Negative.   HENT: Negative.   Eyes: Negative.   Respiratory: Negative.   Cardiovascular: Negative.   Gastrointestinal: Negative.   Endocrine: Negative.   Genitourinary: Positive for flank pain.  Musculoskeletal: Positive for back pain.  Skin: Negative.   Allergic/Immunologic: Negative.   Neurological: Negative.   Hematological: Negative.   Psychiatric/Behavioral: Negative.    Physical Exam   Blood pressure 123/75, pulse (!) 152,  temperature 99.4 F (37.4 C), temperature source Oral, resp. rate 18, height 5\' 3"  (1.6 m), weight 246 lb (111.6 kg), last menstrual period 11/03/2016. Patient Vitals for the past 24 hrs:  BP Temp Temp src Pulse Resp SpO2 Height Weight  06/28/17 0310 - 99.2 F (37.3 C) Axillary - - - - -  06/28/17 0300 - 98.6 F (37 C) Oral (!) 120 - 97 % - -  06/28/17 0044 123/75 99.4 F (37.4 C) Oral (!) 152 18 - 5\' 3"  (1.6 m) 246 lb (111.6 kg)    Physical Exam  Nursing note and vitals reviewed. Constitutional: She appears well-developed and well-nourished.   HENT:  Head: Normocephalic.  Eyes: Pupils are equal, round, and reactive to light.  Neck: Normal range of motion.  Cardiovascular: Normal rate, regular rhythm and normal heart sounds.  Respiratory: Effort normal and breath sounds normal.  GI: Soft. Normal appearance and bowel sounds are normal. There is CVA tenderness (RT).  Musculoskeletal: Normal range of motion.    MAU Course  Procedures  MDM Sepsis Protocol initiated per consult with Dr. Despina HiddenEure NST - FHR: 150 bpm / moderate variability / accels present / decels absent / TOCO:none  *Consult with Dr. Ernestina PennaFogleman @ 0225 - notified of patient's complaints, assessments, lab results - admit to HROB unit -- orders placed by Dr. Ernestina PennaFogleman; Dilaudid 2 mg IVP, check cervix prior to admission.  Results for orders placed or performed during the hospital encounter of 06/28/17 (from the past 24 hour(s))  Urinalysis, Routine w reflex microscopic     Status: Abnormal   Collection Time: 06/28/17 12:55 AM  Result Value Ref Range   Color, Urine YELLOW YELLOW   APPearance HAZY (A) CLEAR   Specific Gravity, Urine 1.012 1.005 - 1.030   pH 7.0 5.0 - 8.0   Glucose, UA NEGATIVE NEGATIVE mg/dL   Hgb urine dipstick SMALL (A) NEGATIVE   Bilirubin Urine NEGATIVE NEGATIVE   Ketones, ur NEGATIVE NEGATIVE mg/dL   Protein, ur NEGATIVE NEGATIVE mg/dL   Nitrite NEGATIVE NEGATIVE   Leukocytes, UA MODERATE (A) NEGATIVE   RBC / HPF 0-5 0 - 5 RBC/hpf   WBC, UA 6-30 0 - 5 WBC/hpf   Bacteria, UA MANY (A) NONE SEEN   Squamous Epithelial / LPF 0-5 (A) NONE SEEN   Mucus PRESENT   Urine rapid drug screen (hosp performed)not at [facility]     Status: None   Collection Time: 06/28/17 12:55 AM  Result Value Ref Range   Opiates NONE DETECTED NONE DETECTED   Cocaine NONE DETECTED NONE DETECTED   Benzodiazepines NONE DETECTED NONE DETECTED   Amphetamines NONE DETECTED NONE DETECTED   Tetrahydrocannabinol NONE DETECTED NONE DETECTED   Barbiturates NONE DETECTED NONE DETECTED   CBC with Differential     Status: Abnormal   Collection Time: 06/28/17  1:29 AM  Result Value Ref Range   WBC 16.5 (H) 4.0 - 10.5 K/uL   RBC 3.89 3.87 - 5.11 MIL/uL   Hemoglobin 10.9 (L) 12.0 - 15.0 g/dL   HCT 56.234.0 (L) 13.036.0 - 86.546.0 %   MCV 87.4 78.0 - 100.0 fL   MCH 28.0 26.0 - 34.0 pg   MCHC 32.1 30.0 - 36.0 g/dL   RDW 78.413.5 69.611.5 - 29.515.5 %   Platelets 231 150 - 400 K/uL   Neutrophils Relative % 94 %   Neutro Abs 15.5 (H) 1.7 - 7.7 K/uL   Lymphocytes Relative 3 %   Lymphs Abs 0.5 (L) 0.7 - 4.0 K/uL  Monocytes Relative 3 %   Monocytes Absolute 0.5 0.1 - 1.0 K/uL   Eosinophils Relative 0 %   Eosinophils Absolute 0.0 0.0 - 0.7 K/uL   Basophils Relative 0 %   Basophils Absolute 0.0 0.0 - 0.1 K/uL  Comprehensive metabolic panel     Status: Abnormal   Collection Time: 06/28/17  1:29 AM  Result Value Ref Range   Sodium 137 135 - 145 mmol/L   Potassium 3.3 (L) 3.5 - 5.1 mmol/L   Chloride 105 101 - 111 mmol/L   CO2 20 (L) 22 - 32 mmol/L   Glucose, Bld 137 (H) 65 - 99 mg/dL   BUN 7 6 - 20 mg/dL   Creatinine, Ser 8.11 0.44 - 1.00 mg/dL   Calcium 9.5 8.9 - 91.4 mg/dL   Total Protein 6.8 6.5 - 8.1 g/dL   Albumin 3.2 (L) 3.5 - 5.0 g/dL   AST 18 15 - 41 U/L   ALT 11 (L) 14 - 54 U/L   Alkaline Phosphatase 78 38 - 126 U/L   Total Bilirubin 0.3 0.3 - 1.2 mg/dL   GFR calc non Af Amer >60 >60 mL/min   GFR calc Af Amer >60 >60 mL/min   Anion gap 12 5 - 15  Lactic acid, plasma     Status: None   Collection Time: 06/28/17  1:29 AM  Result Value Ref Range   Lactic Acid, Venous 1.9 0.5 - 1.9 mmol/L  Procalcitonin     Status: None   Collection Time: 06/28/17  1:29 AM  Result Value Ref Range   Procalcitonin 0.14 ng/mL  Protime-INR     Status: None   Collection Time: 06/28/17  1:29 AM  Result Value Ref Range   Prothrombin Time 13.8 11.4 - 15.2 seconds   INR 1.07   APTT     Status: None   Collection Time: 06/28/17  1:29 AM  Result Value Ref Range   aPTT 25 24 - 36 seconds  Type and  screen [facility]     Status: None   Collection Time: 06/28/17  1:44 AM  Result Value Ref Range   ABO/RH(D) O POS    Antibody Screen NEG    Sample Expiration 07/01/2017     Assessment and Plan  Pyelonephritis affecting pregnancy in third trimester  - Plan: US OB Comp + 14 Wk, US OB Comp + 14 Wk - Admit to HROB unit - Dr. Ernestina Penna assumes care upon admission   Raelyn Mora, MSN, CNM 06/28/2017, 2:26 AM

## 2017-06-28 NOTE — MAU Note (Signed)
Pt has been on antibiotics for recurring uti's this preg.  She was due to run out of antibiotics so she started taking atbx QOD so she could afford for it to last til she got paid again..  She has been experiencing right flank pain all day today with chills and nausea.

## 2017-06-29 ENCOUNTER — Inpatient Hospital Stay (HOSPITAL_COMMUNITY): Payer: BLUE CROSS/BLUE SHIELD

## 2017-06-29 LAB — GLUCOSE, CAPILLARY
GLUCOSE-CAPILLARY: 86 mg/dL (ref 65–99)
GLUCOSE-CAPILLARY: 94 mg/dL (ref 65–99)
Glucose-Capillary: 94 mg/dL (ref 65–99)

## 2017-06-29 MED ORDER — OXYCODONE-ACETAMINOPHEN 5-325 MG PO TABS: 1.0000 | ORAL_TABLET | ORAL | Status: DC | PRN

## 2017-06-29 MED ORDER — PROMETHAZINE HCL 25 MG/ML IJ SOLN
25.0000 mg | Freq: Three times a day (TID) | INTRAMUSCULAR | Status: DC | PRN
  Administered 2017-06-29: 25 mg via INTRAVENOUS
  Filled 2017-06-29: qty 1

## 2017-06-29 NOTE — Progress Notes (Signed)
HD#2 32 wks, right pyelonephritis. EDC by 6.3 wk sono on 01/01/17 in [facility]U  Still has back pain, relieved with Dilaudid but its causing nausea and lack of appetite.  No fevers.  No vaginal bleeding/ contractions. Good FM.   PE: Vitals:   06/28/17 2111 06/28/17 2340 06/29/17 0424 06/29/17 0804  BP:  118/62 122/66 (!) 117/57  Pulse:  (!) 110 (!) 129 (!) 118  Resp:  20 18 18   Temp: 99.6 F (37.6 C) 98.5 F (36.9 C) 99.1 F (37.3 C) 99.3 F (37.4 C)  TempSrc: Oral Oral Oral Oral  SpO2:  98% 98% 97%  Weight:   250 lb 3 oz (113.5 kg)   Height:        Gen: A&O x 3, NAD but in right flank pain with movements CV: Mild tachycardia persists Pulm: CTAB. No crackles Abd: gravid, NT, no fundal tenderness, soft gravid uterus Back: R CVAT present with gentle touch Pelvic: deferred  LE: NT, no edema, no calf tenderness  Toco: no ctx. FH: 150s, + accels, no decels, 15 beat var   UCx pending  A/P: 31 yo [id number] at 8132 with R sided pyelonephritis in the setting of recurrent e.coli UTI and non-compliance with suppression antibiotics. On Ceftriaxone 2g IV q24 hrs, s/p 2 doses Tentative Urine cx- gram neg rods, final pending.  Blood culture pending.  Pain persists, plan renal sono if not better by tomorrow  H/o GDM, now LGA baby, normal BS checks but not eating much. Home diet reviewed and seems ok.  FHT- category I- reactive NST MFM sono- 12/24 -   SIUP at 31+6 weeks   Normal detailed fetal anatomy; limited views of PF, face, DA, diaphragm, CI and details of extremities; normal sinus rhythm   with very infrequent ectopy   Normal amniotic fluid volume   Measurements consistent with early US; EFW at the 85th %tile; AC > 97th %tile  - Case management involved, will help pt obtain outpt meds.  Robley FriesVaishali R Koleman Marling 06/29/2017 11:35 AM

## 2017-06-30 LAB — CBC WITH DIFFERENTIAL/PLATELET
BASOS ABS: 0 10*3/uL (ref 0.0–0.1)
BASOS PCT: 0 %
EOS PCT: 3 %
Eosinophils Absolute: 0.1 10*3/uL (ref 0.0–0.7)
HCT: 29.3 % — ABNORMAL LOW (ref 36.0–46.0)
Hemoglobin: 9.4 g/dL — ABNORMAL LOW (ref 12.0–15.0)
Lymphocytes Relative: 29 %
Lymphs Abs: 1.1 10*3/uL (ref 0.7–4.0)
MCH: 28.1 pg (ref 26.0–34.0)
MCHC: 32.1 g/dL (ref 30.0–36.0)
MCV: 87.7 fL (ref 78.0–100.0)
MONO ABS: 0.5 10*3/uL (ref 0.1–1.0)
Monocytes Relative: 13 %
NEUTROS ABS: 2 10*3/uL (ref 1.7–7.7)
Neutrophils Relative %: 55 %
PLATELETS: 189 10*3/uL (ref 150–400)
RBC: 3.34 MIL/uL — ABNORMAL LOW (ref 3.87–5.11)
RDW: 14 % (ref 11.5–15.5)
WBC: 3.7 10*3/uL — AB (ref 4.0–10.5)

## 2017-06-30 LAB — CULTURE, OB URINE
Culture: >=100000 — AB
SPECIAL REQUESTS: NORMAL

## 2017-06-30 MED ORDER — CEPHALEXIN 500 MG PO CAPS: 500.0000 mg | ORAL_CAPSULE | Freq: Two times a day (BID) | ORAL | 0 refills | Status: DC

## 2017-06-30 MED ORDER — ACETAMINOPHEN 500 MG PO TABS: 500.0000 mg | ORAL_TABLET | Freq: Four times a day (QID) | ORAL | 0 refills | Status: DC | PRN

## 2017-06-30 MED FILL — CEPHALEXIN 500 MG CAPSULE: 500 | 14 days supply | Qty: 28 | Fill #0

## 2017-06-30 NOTE — Discharge Summary (Addendum)
Physician Discharge Summary  Patient ID: Nicole Landry MRN: [id number] DOB/AGE: 1986-04-01 31 y.o.  Admit date: 06/28/2017 Discharge date: 06/30/2017  Admission Diagnoses: 31.6 wks pregnancy. Acute right pyelonephritis  Discharge Diagnoses: 32.1 wks  Active Problems:   Pyelonephritis complicating pregnancy in third trimester  Gen: A&O x 3, NAD but in right flank pain with movements CV: Mild tachycardia persists Pulm: CTAB. No crackles Abd: gravid, NT, no fundal tenderness, soft gravid uterus Back: R CVAT present with gentle touch Pelvic: deferred  LE: NT, no edema, no calf tenderness  Toco: no ctx. FH: 150s, + accels, no decels, 15 beat var NST reactive    Urine culture Gm neg rods, final pending Blood culture x2 negative CBC reviewed. WBC improved, anemia  Renal sono - right mild hydronephrosis, no abscess  31 yo G5P2022 at 32.1 wks with Right pyelonephritis with history of recurrent e.coli UTI and non-compliance with suppression antibiotics. Ceftriaxone 2g IV q24 hrs, s/p 3 doses and better.  Blood culture negative.  H/o GDM, now LGA baby, normal BS checks but not eating much. Home diet reviewed and seems ok.  FHT- category I- reactive NST  MFM sono- 12/24 -              SIUP 31.6 weeks             Normal detailed fetal anatomy; limited views of PF, face, DA, diaphragm, CI and details of extremities; normal sinus rhythm             with very infrequent ectopy             Normal amniotic fluid volume             Measurements consistent with early US; EFW at the 85th %tile; AC >97th %tile  Discharge home Keflex 500mg  po bid 14 days, then 1 daily Iron daily PNvit daily Tylenol prn pain    Disposition: 01-Home or Self Care   Allergies as of 06/30/2017      Reactions   Food Anaphylaxis, Other (See Comments)   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     Vicodin [hydrocodone-acetaminophen] Other (See Comments)   Reaction:  Seizures        Medication List    STOP taking these medications   nitrofurantoin 50 MG capsule Commonly known as:  MACRODANTIN     TAKE these medications   acetaminophen 500 MG tablet Commonly known as:  TYLENOL Take 1 tablet (500 mg total) by mouth every 6 (six) hours as needed.   cephALEXin 500 MG capsule Commonly known as:  KEFLEX Take 1 capsule (500 mg total) by mouth 2 (two) times daily.   IRON PO Take 1 tablet by mouth daily.   PRENATAL GUMMIES/DHA & FA 0.4-32.5 MG Chew Chew 2 each by mouth daily.   prenatal multivitamin Tabs tablet Take 1 tablet by mouth daily at 12 noon.      Follow-up Information    Shea EvansMody, Eiliyah Reh, MD Follow up on 07/08/2017.   Specialty:  Obstetrics and Gynecology Why:  per appointment Contact information: [address] [place] [address] [phone]           Signed: Robley FriesVaishali R Lavarr President 06/30/2017, 8:45 AM

## 2017-06-30 NOTE — Discharge Instructions (Signed)
Pyelonephritis, Adult Pyelonephritis is a kidney infection. The kidneys are organs that help clean your blood by moving waste out of your blood and into your pee (urine). This infection can happen quickly, or it can last for a long time. In most cases, it clears up with treatment and does not cause other problems. Follow these instructions at home: Medicines  Take over-the-counter and prescription medicines only as told by your doctor.  Take your antibiotic medicine as told by your doctor. Do not stop taking the medicine even if you start to feel better. General instructions  Drink enough fluid to keep your pee clear or pale yellow.  Avoid caffeine, tea, and carbonated drinks.  Pee (urinate) often. Avoid holding in pee for long periods of time.  Pee before and after sex.  After pooping (having a bowel movement), women should wipe from front to back. Use each tissue only once.  Keep all follow-up visits as told by your doctor. This is important. Contact a doctor if:  You do not feel better after 2 days.  Your symptoms get worse.  You have a fever. Get help right away if:  You cannot take your medicine or drink fluids as told.  You have chills and shaking.  You throw up (vomit).  You have very bad pain in your side (flank) or back.  You feel very weak or you pass out (faint). This information is not intended to replace advice given to you by your health care provider. Make sure you discuss any questions you have with your health care provider. Document Released: 07/30/2004 Document Revised: 11/28/2015 Document Reviewed: 10/15/2014 Elsevier Interactive Patient Education  2018 Elsevier Inc.  

## 2017-06-30 NOTE — Progress Notes (Signed)
This is a late entry from a visit that took place on 12/25 in the afternoon.  Nicole Landry was having a difficult day being away from her kids on Christmas (daughter, age 771 and son, age 656 or 57).  Her husband was going to bring them up to the hospital later in the day.  She had never been separated from her children and her children had never stayed with anyone but her or her husband.  Her mother was going to help them out with childcare if her hospitalization had continued for longer, but that was difficult for Nicole Landry, as her mother "doesn't always have the best instincts."  She shared about how her parenting style of always being with her kids was influenced by her own upbringing as a result of her mother giving birth to her at age 31.  She wanted a different childhood for her children and has worked hard to try to create that for them.  She is concerned about the baby she is carrying and trying to balance her own care and care for her other chidren with the needs of her unborn child.  I offered listening presence and prayer at her request and normalized the delicate balance of all of those competing needs.  Chaplain Dyanne CarrelKaty Meagon Duskin, Bcc Pager, [phone] 9:37 AM    06/30/17 0900  Clinical Encounter Type  Visited With Patient  Visit Type Spiritual support  Referral From Physician  Spiritual Encounters  Spiritual Needs Prayer;Emotional  Stress Factors  Patient Stress Factors Financial concerns;Loss of control

## 2017-06-30 NOTE — Progress Notes (Signed)
0300-pt stated her pain is climbing back up but refuses to take anymore pain meds 0630-checked on pt and she c/o headache. Offered Tylenol. Pt refused. Stated she was okay and didn't want to take anything.

## 2017-06-30 NOTE — Progress Notes (Signed)
Prescription not signed so [facility] [phone] Keflex 500mg  1 PO BID #[id number].

## 2017-06-30 NOTE — Progress Notes (Addendum)
HD#3 32.1 wks, right pyelonephritis. EDC by 6.3 wk sono on 01/01/17 in MAU  Still has back pain, not desiring pain meds No fevers.  No vaginal bleeding/ contractions. Good FM.   PE: Vitals:   06/29/17 1934 06/29/17 2320 06/30/17 0736 06/30/17 0752  BP: (!) 116/59 132/71 123/70   Pulse: (!) 102 (!) 104 (!) 101   Resp: 18 20 20    Temp: 98.5 F (36.9 C) 98.1 F (36.7 C) 98.1 F (36.7 C)   TempSrc: Oral Oral Oral   SpO2: 100% 99%  100%  Weight:      Height:        Gen: A&O x 3, NAD but in right flank pain with movements CV: Mild tachycardia persists Pulm: CTAB. No crackles Abd: gravid, NT, no fundal tenderness, soft gravid uterus Back: R CVAT present with gentle touch Pelvic: deferred  LE: NT, no edema, no calf tenderness  Toco: no ctx. FH: 150s, + accels, no decels, 15 beat var   UCx Gm neg rods, final pending Blood cx x2 negative CBC reviewed. WBC improved, anemia  Renal sono - right mild hydronephrosis, no abscess  A/P: 31 yo G5P2022 at 32.1 wks with Right pyelonephritis in the setting of recurrent e.coli UTI and non-compliance with suppression antibiotics. On Ceftriaxone 2g IV q24 hrs, s/p 3 doses and better.  Blood culture neg x2.  H/o GDM, now LGA baby, normal BS checks but not eating much. Home diet reviewed and seems ok.  FHT- category I- reactive NST  MFM sono- 12/24 -   SIUP at 31+6 weeks   Normal detailed fetal anatomy; limited views of PF, face, DA, diaphragm, CI and details of extremities; normal sinus rhythm   with very infrequent ectopy   Normal amniotic fluid volume   Measurements consistent with early US; EFW at the 85th %tile; AC > 97th %tile  Discharge home Keflex 500mg  po bid 12 days, then 1 daily Iron daily PNvit daily Tylenol prn pain   Reportable symptoms reviewed  Robley FriesVaishali R Tiajah Landry 06/30/2017 8:36 AM

## 2017-06-30 NOTE — Care Management Note (Signed)
Case Management Note  Patient Details  Name: Nicole Landry MRN: [id number] Date of Birth: 1986-04-29  Subjective/Objective:             Pyelonephritis complicating pregnancy in third trimester  Action/Plan: Medication Assistance - MATCH Program.  Expected Discharge Date:  06/30/17                Additional Comments:  Case Manager notified by Pt's Nurse that Pt would be discharged today with po Keflex and would need assistance in paying for this medication.  CM reviewed previous CM's note.  MATCH Program completed in system and MATCH Letter given to the Pt at bedside in room 302.  There is a list of participating pharmacies attached to the letter but the Pt stated that she would be using [facility].  Pt has CM's name and number if any questions.  Pt aware that her Rx would be $3.00 - Keflex.  Over the counter and pain medications are not covered under this program and Pt voiced understanding.  CM notified Pt's Nurse that medication assistance has been completed with the Pt.    CM available to assist as needed.  Nicole Landry, Nicole Landry, [place]  [phone] 06/30/2017, 10:04 AM

## 2017-07-03 LAB — CULTURE, BLOOD (ROUTINE X 2)
CULTURE: NO GROWTH
Culture: NO GROWTH
SPECIAL REQUESTS: ADEQUATE
SPECIAL REQUESTS: ADEQUATE

## 2017-07-19 ENCOUNTER — Encounter (HOSPITAL_COMMUNITY): Payer: Self-pay | Admitting: *Deleted

## 2017-07-19 ENCOUNTER — Inpatient Hospital Stay (HOSPITAL_COMMUNITY)
Admission: AD | Admit: 2017-07-19 | Discharge: 2017-07-19 | Disposition: A | Discharge status: Home / Self Care | Payer: BLUE CROSS/BLUE SHIELD | Source: Ambulatory Visit | Attending: Obstetrics and Gynecology | Admitting: Obstetrics and Gynecology

## 2017-07-19 DIAGNOSIS — O99613 Diseases of the digestive system complicating pregnancy, third trimester: Secondary | ICD-10-CM | POA: Diagnosis not present

## 2017-07-19 DIAGNOSIS — E860 Dehydration: Secondary | ICD-10-CM

## 2017-07-19 DIAGNOSIS — K529 Noninfective gastroenteritis and colitis, unspecified: Secondary | ICD-10-CM | POA: Diagnosis not present

## 2017-07-19 DIAGNOSIS — Z3A34 34 weeks gestation of pregnancy: Secondary | ICD-10-CM | POA: Diagnosis not present

## 2017-07-19 DIAGNOSIS — Z3689 Encounter for other specified antenatal screening: Secondary | ICD-10-CM

## 2017-07-19 DIAGNOSIS — O4703 False labor before 37 completed weeks of gestation, third trimester: Secondary | ICD-10-CM

## 2017-07-19 DIAGNOSIS — O4700 False labor before 37 completed weeks of gestation, unspecified trimester: Secondary | ICD-10-CM

## 2017-07-19 DIAGNOSIS — R109 Unspecified abdominal pain: Secondary | ICD-10-CM | POA: Diagnosis present

## 2017-07-19 HISTORY — DX: Chronic kidney disease, unspecified: N18.9

## 2017-07-19 LAB — CBC WITH DIFFERENTIAL/PLATELET
BASOS PCT: 0 %
Basophils Absolute: 0 10*3/uL (ref 0.0–0.1)
Eosinophils Absolute: 0 10*3/uL (ref 0.0–0.7)
Eosinophils Relative: 0 %
HCT: 37.8 % (ref 36.0–46.0)
HEMOGLOBIN: 12.1 g/dL (ref 12.0–15.0)
LYMPHS ABS: 0.6 10*3/uL — AB (ref 0.7–4.0)
LYMPHS PCT: 5 %
MCH: 27.8 pg (ref 26.0–34.0)
MCHC: 32 g/dL (ref 30.0–36.0)
MCV: 86.7 fL (ref 78.0–100.0)
MONO ABS: 0.2 10*3/uL (ref 0.1–1.0)
Monocytes Relative: 2 %
NEUTROS ABS: 10.7 10*3/uL — AB (ref 1.7–7.7)
NEUTROS PCT: 93 %
Platelets: 247 10*3/uL (ref 150–400)
RBC: 4.36 MIL/uL (ref 3.87–5.11)
RDW: 14.2 % (ref 11.5–15.5)
WBC: 11.5 10*3/uL — ABNORMAL HIGH (ref 4.0–10.5)

## 2017-07-19 LAB — URINALYSIS, ROUTINE W REFLEX MICROSCOPIC
Bilirubin Urine: NEGATIVE
GLUCOSE, UA: NEGATIVE mg/dL
Hgb urine dipstick: NEGATIVE
Ketones, ur: 80 mg/dL — AB
Nitrite: NEGATIVE
Protein, ur: 30 mg/dL — AB
SPECIFIC GRAVITY, URINE: 1.019 (ref 1.005–1.030)
pH: 6 (ref 5.0–8.0)

## 2017-07-19 MED ORDER — PROMETHAZINE HCL 25 MG PO TABS: 25.0000 mg | ORAL_TABLET | Freq: Four times a day (QID) | ORAL | 0 refills | Status: DC | PRN

## 2017-07-19 MED ORDER — LACTATED RINGERS IV BOLUS (SEPSIS)
1000.0000 mL | Freq: Once | INTRAVENOUS | Status: AC
  Administered 2017-07-19: 1000 mL via INTRAVENOUS

## 2017-07-19 MED ORDER — BETAMETHASONE SOD PHOS & ACET 6 (3-3) MG/ML IJ SUSP
12.0000 mg | Freq: Once | INTRAMUSCULAR | Status: AC
  Administered 2017-07-19: 12 mg via INTRAMUSCULAR
  Filled 2017-07-19: qty 2

## 2017-07-19 MED ORDER — PROMETHAZINE HCL 25 MG/ML IJ SOLN
25.0000 mg | Freq: Once | INTRAMUSCULAR | Status: AC
  Administered 2017-07-19: 25 mg via INTRAVENOUS
  Filled 2017-07-19: qty 1

## 2017-07-19 MED ORDER — ONDANSETRON HCL 4 MG/2ML IJ SOLN
4.0000 mg | Freq: Once | INTRAMUSCULAR | Status: AC
  Administered 2017-07-19: 4 mg via INTRAVENOUS
  Filled 2017-07-19: qty 2

## 2017-07-19 MED ORDER — HYDROXYZINE HCL 50 MG/ML IM SOLN
50.0000 mg | Freq: Once | INTRAMUSCULAR | Status: DC
  Filled 2017-07-19: qty 1

## 2017-07-19 NOTE — Discharge Instructions (Signed)
Viral Gastroenteritis, Adult Viral gastroenteritis is also known as the stomach flu. This condition is caused by certain germs (viruses). These germs can be passed from person to person very easily (are very contagious). This condition can cause sudden watery poop (diarrhea), fever, and throwing up (vomiting). Having watery poop and throwing up can make you feel weak and cause you to get dehydrated. Dehydration can make you tired and thirsty, make you have a dry mouth, and make it so you pee (urinate) less often. Older adults and people with other diseases or a weak defense system (immune system) are at higher risk for dehydration. It is important to replace the fluids that you lose from having watery poop and throwing up. Follow these instructions at home: Follow instructions from your doctor about how to care for yourself at home. Eating and drinking  Follow these instructions as told by your doctor:  Take an oral rehydration solution (ORS). This is a drink that is sold at pharmacies and stores.  Drink clear fluids in small amounts as you are able, such as: ? Water. ? Ice chips. ? Diluted fruit juice. ? Low-calorie sports drinks.  Eat bland, easy-to-digest foods in small amounts as you are able, such as: ? Bananas. ? Applesauce. ? Rice. ? Low-fat (lean) meats. ? Toast. ? Crackers.  Avoid fluids that have a lot of sugar or caffeine in them.  Avoid alcohol.  Avoid spicy or fatty foods.  General instructions  Drink enough fluid to keep your pee (urine) clear or pale yellow.  Wash your hands often. If you cannot use soap and water, use hand sanitizer.  Make sure that all people in your home wash their hands well and often.  Rest at home while you get better.  Take over-the-counter and prescription medicines only as told by your doctor.  Watch your condition for any changes.  Take a warm bath to help with any burning or pain from having watery poop.  Keep all follow-up  visits as told by your doctor. This is important. Contact a doctor if:  You cannot keep fluids down.  Your symptoms get worse.  You have new symptoms.  You feel light-headed or dizzy.  You have muscle cramps. Get help right away if:  You have chest pain.  You feel very weak or you pass out (faint).  You see blood in your throw-up.  Your throw-up looks like coffee grounds.  You have bloody or black poop (stools) or poop that look like tar.  You have a very bad headache, a stiff neck, or both.  You have a rash.  You have very bad pain, cramping, or bloating in your belly (abdomen).  You have trouble breathing.  You are breathing very quickly.  Your heart is beating very quickly.  Your skin feels cold and clammy.  You feel confused.  You have pain when you pee.  You have signs of dehydration, such as: ? Dark pee, hardly any pee, or no pee. ? Cracked lips. ? Dry mouth. ? Sunken eyes. ? Sleepiness. ? Weakness. This information is not intended to replace advice given to you by your health care provider. Make sure you discuss any questions you have with your health care provider. Document Released: 12/09/2007 Document Revised: 01/10/2016 Document Reviewed: 02/26/2015 Elsevier Interactive Patient Education  2017 Elsevier Inc. Bland Diet A bland diet consists of foods that do not have a lot of fat or fiber. Foods without fat or fiber are easier for the body to   digest. They are also less likely to irritate your mouth, throat, stomach, and other parts of your gastrointestinal tract. A bland diet is sometimes called a BRAT diet. What is my plan? Your health care provider or dietitian may recommend specific changes to your diet to prevent and treat your symptoms, such as:  Eating small meals often.  Cooking food until it is soft enough to chew easily.  Chewing your food well.  Drinking fluids slowly.  Not eating foods that are very spicy, sour, or fatty.  Not  eating citrus fruits, such as oranges and grapefruit.  What do I need to know about this diet?  Eat a variety of foods from the bland diet food list.  Do not follow a bland diet longer than you have to.  Ask your health care provider whether you should take vitamins. What foods can I eat? Grains  Hot cereals, such as cream of wheat. Bread, crackers, or tortillas made from refined white flour. Rice. Vegetables Canned or cooked vegetables. Mashed or boiled potatoes. Fruits Bananas. Applesauce. Other types of cooked or canned fruit with the skin and seeds removed, such as canned peaches or pears. Meats and Other Protein Sources Scrambled eggs. Creamy peanut butter or other nut butters. Lean, well-cooked meats, such as chicken or fish. Tofu. Soups or broths. Dairy Low-fat dairy products, such as milk, cottage cheese, or yogurt. Beverages Water. Herbal tea. Apple juice. Sweets and Desserts Pudding. Custard. Fruit gelatin. Ice cream. Fats and Oils Mild salad dressings. Canola or olive oil. The items listed above may not be a complete list of allowed foods or beverages. Contact your dietitian for more options. What foods are not recommended? Foods and ingredients that are often not recommended include:  Spicy foods, such as hot sauce or salsa.  Fried foods.  Sour foods, such as pickled or fermented foods.  Raw vegetables or fruits, especially citrus or berries.  Caffeinated drinks.  Alcohol.  Strongly flavored seasonings or condiments.  The items listed above may not be a complete list of foods and beverages that are not allowed. Contact your dietitian for more information. This information is not intended to replace advice given to you by your health care provider. Make sure you discuss any questions you have with your health care provider. Document Released: 10/14/2015 Document Revised: 11/28/2015 Document Reviewed: 07/04/2014 Elsevier Interactive Patient Education  2018  Elsevier Inc.  

## 2017-07-19 NOTE — MAU Note (Signed)
Pt started having CTX this AM and went in to MD office for evaluation.  Cervix closed.  Pt states she was admitted to hospital over Christmas for kidney infection and that her Dr wanted her to be evaluated in MAU today to r/o kidney infection as well as preterm labor. Denies LOF or vaginal bleeding.  Reports good fetal movement.

## 2017-07-19 NOTE — MAU Provider Note (Signed)
History     CSN: [id number]  Arrival date and time: 07/19/17 1630   First Provider Initiated Contact with Patient 07/19/17 1816      Chief Complaint  Patient presents with  . Contractions  . Nausea   HPI Nicole Landry is a 32 y.o. [id number] at [id number] who presents from the office for IV fluids & evaluation. Patient reports abdominal pain & n/v that started this morning. Describes abdominal pain as contractions but can't tell how frequently it's occurring. Rates pain 5/10. Has not treated pain. States she feels like the nausea & vomiting is r/t her abdominal pain. Was seen in the office by Dr. Juliene PinaMody. Was having contractions in the office, cervix was closed. Sent for BMZ, IV fluids, & nausea medication. Patient with history of UTI & pyelo during this pregnancy & is currently on suppressive therapy. Denies dysuria, hematuria, fever/chills, or flank pain. Denies LOF, vaginal bleeding, or diarrhea. States her child is sick with the GI bug over the weekend.  Positive fetal movement.  OB History    Gravida Para Term Preterm AB Living   5 2 2  0 2 2   SAB TAB Ectopic Multiple Live Births   1 0 1   2      Past Medical History:  Diagnosis Date  . Chronic kidney disease   . Ectopic pregnancy, tubal   . Gestational diabetes   . Gestational diabetes mellitus 2011   denies gestational DM with current pregnancy  . Ovarian cyst   . Status post vacuum-assisted vaginal delivery (5/17) 11/20/2015    Past Surgical History:  Procedure Laterality Date  . BILATERAL SALPINGECTOMY Right 02/08/2013   Procedure:  SALPINGECTOMY;  Surgeon: Antionette CharLisa Jackson-Moore, MD;  Location: [facility];  Service: Gynecology;  Laterality: Right;  . CESAREAN SECTION    . ceserean section    . LAPAROSCOPY N/A 02/08/2013   Procedure: LAPAROSCOPY OPERATIVE;  Surgeon: Antionette CharLisa Jackson-Moore, MD;  Location: [facility];  Service: Gynecology;  Laterality: N/A;  . SALPINGECTOMY Right 02/2013   op note in epic    History reviewed. No  pertinent family history.  Social History   Tobacco Use  . Smoking status: Never Smoker  . Smokeless tobacco: Never Used  Substance Use Topics  . Alcohol use: No    Frequency: Never  . Drug use: No    Allergies:  Allergies  Allergen Reactions  . 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    . Vicodin [Hydrocodone-Acetaminophen] Other (See Comments)    Reaction:  Seizures     Medications Prior to Admission  Medication Sig Dispense Refill Last Dose  . acetaminophen (TYLENOL) 500 MG tablet Take 1 tablet (500 mg total) by mouth every 6 (six) hours as needed. 30 tablet 0   . cephALEXin (KEFLEX) 500 MG capsule Take 1 capsule (500 mg total) by mouth 2 (two) times daily. 28 capsule 0   . IRON PO Take 1 tablet by mouth daily.   06/28/2017 at Unknown time  . Prenatal MV-Min-FA-Omega-3 (PRENATAL GUMMIES/DHA & FA) 0.4-32.5 MG CHEW Chew 2 each by mouth daily.   06/28/2017 at Unknown time  . Prenatal Vit-Fe Fumarate-FA (PRENATAL MULTIVITAMIN) TABS tablet Take 1 tablet by mouth daily at 12 noon.   06/28/2017 at Unknown time    Review of Systems  Constitutional: Negative for chills and fever.  Gastrointestinal: Positive for abdominal pain, nausea and vomiting. Negative for constipation and diarrhea.  Genitourinary: Negative for dysuria,  flank pain, hematuria, vaginal bleeding and vaginal discharge.  Musculoskeletal: Negative for back pain.   Physical Exam   Blood pressure 133/81, pulse 100, temperature 98.7 F (37.1 C), temperature source Oral, resp. rate 19, last menstrual period 11/03/2016, SpO2 100 %, unknown if currently breastfeeding.  Physical Exam  Nursing note and vitals reviewed. Constitutional: She is oriented to person, place, and time. She appears well-developed and well-nourished. No distress.  HENT:  Head: Normocephalic and atraumatic.  Eyes: Conjunctivae are normal. Right eye exhibits no discharge. Left eye exhibits no discharge.  No scleral icterus.  Neck: Normal range of motion.  Cardiovascular: Normal rate, regular rhythm and normal heart sounds.  No murmur heard. Respiratory: Effort normal and breath sounds normal. No respiratory distress. She has no wheezes.  GI: Soft. There is no tenderness.  Genitourinary:  Genitourinary Comments: Dilation: Closed Effacement (%): Thick Cervical Position: Posterior Exam by:: Judeth Horn, NP   Neurological: She is alert and oriented to person, place, and time.  Skin: Skin is warm and dry. She is not diaphoretic.  Psychiatric: She has a normal mood and affect. Her behavior is normal. Judgment and thought content normal.    MAU Course  Procedures Results for orders placed or performed during the hospital encounter of 07/19/17 (from the past 24 hour(s))  Urinalysis, Routine w reflex microscopic     Status: Abnormal   Collection Time: 07/19/17  4:46 PM  Result Value Ref Range   Color, Urine YELLOW YELLOW   APPearance CLOUDY (A) CLEAR   Specific Gravity, Urine 1.019 1.005 - 1.030   pH 6.0 5.0 - 8.0   Glucose, UA NEGATIVE NEGATIVE mg/dL   Hgb urine dipstick NEGATIVE NEGATIVE   Bilirubin Urine NEGATIVE NEGATIVE   Ketones, ur 80 (A) NEGATIVE mg/dL   Protein, ur 30 (A) NEGATIVE mg/dL   Nitrite NEGATIVE NEGATIVE   Leukocytes, UA LARGE (A) NEGATIVE   RBC / HPF 0-5 0 - 5 RBC/hpf   WBC, UA 6-30 0 - 5 WBC/hpf   Bacteria, UA FEW (A) NONE SEEN   Squamous Epithelial / LPF 6-30 (A) NONE SEEN   Mucus PRESENT   CBC with Differential     Status: Abnormal   Collection Time: 07/19/17  5:38 PM  Result Value Ref Range   WBC 11.5 (H) 4.0 - 10.5 K/uL   RBC 4.36 3.87 - 5.11 MIL/uL   Hemoglobin 12.1 12.0 - 15.0 g/dL   HCT 16.1 09.6 - 04.5 %   MCV 86.7 78.0 - 100.0 fL   MCH 27.8 26.0 - 34.0 pg   MCHC 32.0 30.0 - 36.0 g/dL   RDW 40.9 81.1 - 91.4 %   Platelets 247 150 - 400 K/uL   Neutrophils Relative % 93 %   Neutro Abs 10.7 (H) 1.7 - 7.7 K/uL   Lymphocytes Relative 5 %   Lymphs  Abs 0.6 (L) 0.7 - 4.0 K/uL   Monocytes Relative 2 %   Monocytes Absolute 0.2 0.1 - 1.0 K/uL   Eosinophils Relative 0 %   Eosinophils Absolute 0.0 0.0 - 0.7 K/uL   Basophils Relative 0 %   Basophils Absolute 0.0 0.0 - 0.1 K/uL    MDM NST:  Baseline: 150 bpm, Variability: Good {> 6 bpm), Accelerations: Reactive and Decelerations: Variable: mild Pt afebrile in MAU CBC IV LR bolus & zofran 8 mg  Cervix closed Pt reports improvement in symptoms  Reassessed patient & states she feels nauseated again. States she is still having the pain occasionally but not  as frequently as earlier. Difficult to ascertain frequency of contractions on TOCO d/t maternal movement.  C/w Dr. Amado Nash. Will give dose of phenergan IV & reassess patient.   Care turned over to [facility]     Judeth Horn, NP 07/19/2017 8:35 PM  Pt feeling much better after Phenergan. No emesis. Tolerating ice chips. Pain resolved. SVE closed/long. Likely gastroenteritis and dehydration causing uterine ctx. Discussed pt status with Dr. Amado Nash. Stable for discharge home.  Assessment and Plan   1. [redacted] weeks gestation of pregnancy   2. NST (non-stress test) reactive   3. Gastroenteritis   4. Dehydration   5. 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     Vicodin [hydrocodone-acetaminophen] Other (See Comments)   Reaction:  Seizures       Medication List    TAKE these medications   acetaminophen 500 MG tablet Commonly known as:  TYLENOL Take 1 tablet (500 mg total) by mouth every 6 (six) hours as needed.   cephALEXin 500 MG capsule Commonly known as:  KEFLEX Take 1 capsule (500 mg total) by mouth 2 (two) times daily.   IRON PO Take 1 tablet  by mouth daily.   PRENATAL GUMMIES/DHA & FA 0.4-32.5 MG Chew Chew 2 each by mouth daily.   prenatal multivitamin Tabs tablet Take 1 tablet by mouth daily at 12 noon.   promethazine 25 MG tablet Commonly known as:  PHENERGAN Take 1 tablet (25 mg total) by mouth every 6 (six) hours as needed for nausea or vomiting.      Donette Larry, CNM  07/19/2017 10:00 PM

## 2017-07-20 ENCOUNTER — Other Ambulatory Visit: Payer: Self-pay | Admitting: Obstetrics & Gynecology

## 2017-07-20 LAB — CULTURE, OB URINE

## 2017-07-20 MED FILL — PROMETHAZINE 25 MG TABLET: 25 | 5 days supply | Qty: 20 | Fill #0

## 2017-07-30 ENCOUNTER — Other Ambulatory Visit: Payer: Self-pay | Admitting: Obstetrics & Gynecology

## 2017-08-02 LAB — OB RESULTS CONSOLE GBS: STREP GROUP B AG: POSITIVE

## 2017-08-03 ENCOUNTER — Telehealth (HOSPITAL_COMMUNITY): Payer: Self-pay | Admitting: *Deleted

## 2017-08-03 ENCOUNTER — Encounter (HOSPITAL_COMMUNITY): Payer: Self-pay

## 2017-08-03 NOTE — Telephone Encounter (Signed)
Preadmission screen  

## 2017-08-04 ENCOUNTER — Encounter (HOSPITAL_COMMUNITY): Payer: Self-pay

## 2017-08-17 ENCOUNTER — Encounter (HOSPITAL_COMMUNITY)
Admission: RE | Admit: 2017-08-17 | Discharge: 2017-08-17 | Disposition: A | Discharge status: Home / Self Care | Payer: BLUE CROSS/BLUE SHIELD | Source: Ambulatory Visit | Attending: Obstetrics & Gynecology | Admitting: Obstetrics & Gynecology

## 2017-08-17 ENCOUNTER — Encounter (HOSPITAL_COMMUNITY): Payer: Self-pay | Admitting: *Deleted

## 2017-08-17 HISTORY — DX: Encounter for other specified aftercare: Z51.89

## 2017-08-17 LAB — TYPE AND SCREEN
ABO/RH(D): O POS
Antibody Screen: NEGATIVE

## 2017-08-17 LAB — CBC
HCT: 37.3 % (ref 36.0–46.0)
HEMOGLOBIN: 12.1 g/dL (ref 12.0–15.0)
MCH: 28 pg (ref 26.0–34.0)
MCHC: 32.4 g/dL (ref 30.0–36.0)
MCV: 86.3 fL (ref 78.0–100.0)
Platelets: 274 10*3/uL (ref 150–400)
RBC: 4.32 MIL/uL (ref 3.87–5.11)
RDW: 14.8 % (ref 11.5–15.5)
WBC: 6.9 10*3/uL (ref 4.0–10.5)

## 2017-08-17 NOTE — H&P (Signed)
Nicole Landry is a 32 y.o. female presenting for repeat C-section. She declines tubal sterilization.  [id number], 39 wks, dated by 1st trim sono.  C/section, then VBAC but now declines VBAC. Pregnancy complicated by recurrent UTIs and stopped suppression antibiotic due to loss of insurance, developed pyelonephritis and was [facility]. She is back on suppression abx and is doing well.  S>d but sono AGA.   OB History    Gravida Para Term Preterm AB Living   5 2 2  0 2 2   SAB TAB Ectopic Multiple Live Births   1 0 1   2     Past Medical History:  Diagnosis Date  . Blood transfusion without reported diagnosis    after ectopic  . Chronic kidney disease   . Ectopic pregnancy, tubal   . Gestational diabetes   . Gestational diabetes mellitus 2011   denies gestational DM with current pregnancy  . Ovarian cyst   . Status post vacuum-assisted vaginal delivery (5/17) 11/20/2015   Past Surgical History:  Procedure Laterality Date  . BILATERAL SALPINGECTOMY Right 02/08/2013   Procedure:  SALPINGECTOMY;  Surgeon: Antionette CharLisa Jackson-Moore, MD;  Location: [facility];  Service: Gynecology;  Laterality: Right;  . CESAREAN SECTION    . DILATION AND CURETTAGE OF UTERUS    . LAPAROSCOPY N/A 02/08/2013   Procedure: LAPAROSCOPY OPERATIVE;  Surgeon: Antionette CharLisa Jackson-Moore, MD;  Location: [facility];  Service: Gynecology;  Laterality: N/A;  . SALPINGECTOMY Right 02/2013   op note in epic   Family History: family history is not on file. Social History:  reports that  has never smoked. she has never used smokeless tobacco. She reports that she does not drink alcohol or use drugs.     Maternal Diabetes: No Genetic Screening: Normal Maternal Ultrasounds/Referrals: Normal Fetal Ultrasounds or other Referrals:  None Maternal Substance Abuse:  No Significant Maternal Medications:  Meds include: Other: Keflex for h/o pyelonephritis in this preg Significant Maternal Lab Results:  GBS(+) Other Comments:   None  History ROS- neg  Last menstrual period 11/03/2016, unknown if currently breastfeeding. Exam Physical Exam  BP 127/69   Pulse 99   Temp 98.5 F (36.9 C) (Oral)   Resp 16   Ht 5\' 3"  (1.6 m)   Wt 238 lb 13 oz (108.3 kg)   LMP 11/03/2016 (Approximate)   SpO2 100%   BMI 42.30 kg/m   Physical exam:  A&O x 3, no acute distress. Pleasant HEENT neg, no thyromegaly Lungs CTA bilat CV RRR, S1S2 normal Abdo soft, non tender, non acute Extr no edema/ tenderness Pelvic deferred FHT 140s Toco none  Prenatal labs: ABO, Rh: --/--/O POS (02/12 [id number]) Antibody: NEG (02/12 0835) Rubella: Immune (07/27 0000) RPR: Nonreactive (07/27 0000)  HBsAg: Negative (07/27 0000)  HIV: Non Reactive (12/24 0144)  GBS: Positive (01/28 0000)   Assessment/Plan: [id number], at 39 wks, for elective repeat C-section. Declines sterilization.  Risks/complications of surgery reviewed incl infection, bleeding, damage to internal organs including bladder, bowels, ureters, blood vessels, other risks from anesthesia, VTE and delayed complications of any surgery, complications in future surgery reviewed. Also discussed neonatal complications incl difficult delivery, laceration, vacuum assistance, TTN etc. Pt understands and agrees, all concerns addressed.    Robley FriesVaishali R Breely Panik MD

## 2017-08-17 NOTE — Patient Instructions (Signed)
Nicole Landry  08/17/2017   Your procedure is scheduled on:  08/18/17  [facility] at 1200 noon.  Pick up the phone at the desk and dial [phone]  Call this number if you have problems the morning of surgery: [phone]   Remember:   Do not eat food:(After Midnight) Desps de medianoche.  Do not drink clear liquids: (6 Hours before arrival) 6 horas ante llegada.  Take these medicines the morning of surgery with A SIP OF WATER: Macrodantin   Do not wear jewelry, make-up or nail polish.  Do not wear lotions, powders, or perfumes. Do not wear deodorant.  Do not shave 48 hours prior to surgery.  Do not bring valuables to the hospital.  [facility] is not   responsible for any belongings or valuables brought to the hospital.  Contacts, dentures or bridgework may not be worn into surgery.  Leave suitcase in the car. After surgery it may be brought to your room.  For patients admitted to the hospital, checkout time is 11:00 AM the day of              Discharge.   Patients discharged the day of surgery will not be lowed to drive home  Name and phone number of your driver:      Incentive Spirometry - Practice and bring it with you on the day of surgery. Shower using CHG 2 nights before surgery and the night before surgery.  If you shower the day of surgery use CHG.  Use special wash - you have one bottle of CHG for all showers.  You should use approximately 1/3 of the bottle for each shower.  Please read over the following fact sheets that you were given:   Pain Booklet and Coughing and Deep Breathing

## 2017-08-18 ENCOUNTER — Inpatient Hospital Stay (HOSPITAL_COMMUNITY): Payer: BLUE CROSS/BLUE SHIELD | Admitting: Anesthesiology

## 2017-08-18 ENCOUNTER — Inpatient Hospital Stay (HOSPITAL_COMMUNITY)
Admission: RE | Admit: 2017-08-18 | Discharge: 2017-08-20 | DRG: 787 | Disposition: A | Discharge status: Home / Self Care | Payer: BLUE CROSS/BLUE SHIELD | Source: Ambulatory Visit | Attending: Obstetrics & Gynecology | Admitting: Obstetrics & Gynecology

## 2017-08-18 ENCOUNTER — Encounter (HOSPITAL_COMMUNITY): Payer: Self-pay

## 2017-08-18 ENCOUNTER — Encounter (HOSPITAL_COMMUNITY)
Admission: RE | Disposition: A | Discharge status: Home / Self Care | Payer: Self-pay | Source: Ambulatory Visit | Attending: Obstetrics & Gynecology

## 2017-08-18 DIAGNOSIS — Z87440 Personal history of urinary (tract) infections: Secondary | ICD-10-CM | POA: Diagnosis not present

## 2017-08-18 DIAGNOSIS — O34219 Maternal care for unspecified type scar from previous cesarean delivery: Secondary | ICD-10-CM | POA: Diagnosis not present

## 2017-08-18 DIAGNOSIS — O34211 Maternal care for low transverse scar from previous cesarean delivery: Secondary | ICD-10-CM | POA: Diagnosis present

## 2017-08-18 DIAGNOSIS — O9081 Anemia of the puerperium: Secondary | ICD-10-CM | POA: Diagnosis not present

## 2017-08-18 DIAGNOSIS — Z3A39 39 weeks gestation of pregnancy: Secondary | ICD-10-CM | POA: Diagnosis not present

## 2017-08-18 DIAGNOSIS — Z23 Encounter for immunization: Secondary | ICD-10-CM | POA: Diagnosis not present

## 2017-08-18 DIAGNOSIS — Z98891 History of uterine scar from previous surgery: Secondary | ICD-10-CM

## 2017-08-18 DIAGNOSIS — D62 Acute posthemorrhagic anemia: Secondary | ICD-10-CM | POA: Diagnosis not present

## 2017-08-18 DIAGNOSIS — O99824 Streptococcus B carrier state complicating childbirth: Secondary | ICD-10-CM | POA: Diagnosis present

## 2017-08-18 DIAGNOSIS — Z3A00 Weeks of gestation of pregnancy not specified: Secondary | ICD-10-CM | POA: Diagnosis not present

## 2017-08-18 DIAGNOSIS — O99214 Obesity complicating childbirth: Secondary | ICD-10-CM | POA: Diagnosis present

## 2017-08-18 LAB — RPR: RPR Ser Ql: NONREACTIVE

## 2017-08-18 SURGERY — Surgical Case
Anesthesia: Spinal | Wound class: Clean Contaminated

## 2017-08-18 MED ORDER — DEXAMETHASONE SODIUM PHOSPHATE 4 MG/ML IJ SOLN
INTRAMUSCULAR | Status: DC | PRN
  Administered 2017-08-18: 4 mg via INTRAVENOUS

## 2017-08-18 MED ORDER — DIPHENHYDRAMINE HCL 50 MG/ML IJ SOLN: 12.5000 mg | INTRAMUSCULAR | Status: DC | PRN

## 2017-08-18 MED ORDER — OXYCODONE-ACETAMINOPHEN 5-325 MG PO TABS: 2.0000 | ORAL_TABLET | ORAL | Status: DC | PRN

## 2017-08-18 MED ORDER — SODIUM CHLORIDE 0.9 % IR SOLN
Status: DC | PRN
  Administered 2017-08-18: 1

## 2017-08-18 MED ORDER — DIPHENHYDRAMINE HCL 50 MG/ML IJ SOLN
INTRAMUSCULAR | Status: AC
  Filled 2017-08-18: qty 1

## 2017-08-18 MED ORDER — NALBUPHINE HCL 10 MG/ML IJ SOLN: 5.0000 mg | INTRAMUSCULAR | Status: DC | PRN

## 2017-08-18 MED ORDER — FENTANYL CITRATE (PF) 100 MCG/2ML IJ SOLN
INTRAMUSCULAR | Status: DC | PRN
  Administered 2017-08-18: 25 ug via INTRATHECAL

## 2017-08-18 MED ORDER — ONDANSETRON HCL 4 MG/2ML IJ SOLN
INTRAMUSCULAR | Status: DC | PRN
  Administered 2017-08-18: 4 mg via INTRAVENOUS

## 2017-08-18 MED ORDER — DIPHENHYDRAMINE HCL 25 MG PO CAPS: 25.0000 mg | ORAL_CAPSULE | ORAL | Status: DC | PRN

## 2017-08-18 MED ORDER — LACTATED RINGERS IV SOLN
INTRAVENOUS | Status: DC | PRN
  Administered 2017-08-18: 15:00:00 via INTRAVENOUS

## 2017-08-18 MED ORDER — NALOXONE HCL 0.4 MG/ML IJ SOLN: 0.4000 mg | INTRAMUSCULAR | Status: DC | PRN

## 2017-08-18 MED ORDER — LACTATED RINGERS IV SOLN
INTRAVENOUS | Status: DC
  Administered 2017-08-18 (×2): via INTRAVENOUS

## 2017-08-18 MED ORDER — METOCLOPRAMIDE HCL 5 MG/ML IJ SOLN
INTRAMUSCULAR | Status: DC | PRN
  Administered 2017-08-18: 10 mg via INTRAVENOUS

## 2017-08-18 MED ORDER — SCOPOLAMINE 1 MG/3DAYS TD PT72
1.0000 | MEDICATED_PATCH | Freq: Once | TRANSDERMAL | Status: DC
  Administered 2017-08-18: 1.5 mg via TRANSDERMAL
  Filled 2017-08-18: qty 1

## 2017-08-18 MED ORDER — NALOXONE HCL 4 MG/10ML IJ SOLN
1.0000 ug/kg/h | INTRAVENOUS | Status: DC | PRN
  Filled 2017-08-18: qty 5

## 2017-08-18 MED ORDER — METOCLOPRAMIDE HCL 5 MG/ML IJ SOLN
INTRAMUSCULAR | Status: AC
  Filled 2017-08-18: qty 2

## 2017-08-18 MED ORDER — CEFAZOLIN SODIUM-DEXTROSE 2-4 GM/100ML-% IV SOLN
2.0000 g | INTRAVENOUS | Status: AC
  Administered 2017-08-18: 2 g via INTRAVENOUS
  Filled 2017-08-18: qty 100

## 2017-08-18 MED ORDER — OXYTOCIN 10 UNIT/ML IJ SOLN
INTRAVENOUS | Status: DC | PRN
  Administered 2017-08-18: 40 [IU] via INTRAVENOUS

## 2017-08-18 MED ORDER — NALBUPHINE HCL 10 MG/ML IJ SOLN: 5.0000 mg | Freq: Once | INTRAMUSCULAR | Status: DC | PRN

## 2017-08-18 MED ORDER — MENTHOL 3 MG MT LOZG: 1.0000 | LOZENGE | OROMUCOSAL | Status: DC | PRN

## 2017-08-18 MED ORDER — DEXAMETHASONE SODIUM PHOSPHATE 4 MG/ML IJ SOLN
INTRAMUSCULAR | Status: AC
  Filled 2017-08-18: qty 1

## 2017-08-18 MED ORDER — SENNOSIDES-DOCUSATE SODIUM 8.6-50 MG PO TABS
2.0000 | ORAL_TABLET | ORAL | Status: DC
  Administered 2017-08-19 (×2): 2 via ORAL
  Filled 2017-08-18 (×2): qty 2

## 2017-08-18 MED ORDER — FENTANYL CITRATE (PF) 100 MCG/2ML IJ SOLN
INTRAMUSCULAR | Status: AC
  Filled 2017-08-18: qty 2

## 2017-08-18 MED ORDER — DIBUCAINE 1 % RE OINT: 1.0000 "application " | TOPICAL_OINTMENT | RECTAL | Status: DC | PRN

## 2017-08-18 MED ORDER — OXYCODONE-ACETAMINOPHEN 5-325 MG PO TABS: 1.0000 | ORAL_TABLET | ORAL | Status: DC | PRN

## 2017-08-18 MED ORDER — WITCH HAZEL-GLYCERIN EX PADS: 1.0000 "application " | MEDICATED_PAD | CUTANEOUS | Status: DC | PRN

## 2017-08-18 MED ORDER — MEPERIDINE HCL 50 MG PO TABS: 50.0000 mg | ORAL_TABLET | Freq: Once | ORAL | Status: DC

## 2017-08-18 MED ORDER — FENTANYL CITRATE (PF) 100 MCG/2ML IJ SOLN
50.0000 ug | INTRAMUSCULAR | Status: AC | PRN
  Administered 2017-08-18 (×3): 50 ug via INTRAVENOUS

## 2017-08-18 MED ORDER — ONDANSETRON HCL 4 MG/2ML IJ SOLN: 4.0000 mg | Freq: Three times a day (TID) | INTRAMUSCULAR | Status: DC | PRN

## 2017-08-18 MED ORDER — SIMETHICONE 80 MG PO CHEW
80.0000 mg | CHEWABLE_TABLET | ORAL | Status: DC
  Administered 2017-08-19 (×2): 80 mg via ORAL
  Filled 2017-08-18 (×2): qty 1

## 2017-08-18 MED ORDER — INFLUENZA VAC SPLIT QUAD 0.5 ML IM SUSY
0.5000 mL | PREFILLED_SYRINGE | INTRAMUSCULAR | Status: AC
  Administered 2017-08-20: 0.5 mL via INTRAMUSCULAR
  Filled 2017-08-18: qty 0.5

## 2017-08-18 MED ORDER — COCONUT OIL OIL: 1.0000 "application " | TOPICAL_OIL | Status: DC | PRN

## 2017-08-18 MED ORDER — FENTANYL CITRATE (PF) 100 MCG/2ML IJ SOLN
25.0000 ug | INTRAMUSCULAR | Status: DC | PRN
  Administered 2017-08-18 (×3): 50 ug via INTRAVENOUS

## 2017-08-18 MED ORDER — ADENOSINE 6 MG/2ML IV SOLN
INTRAVENOUS | Status: AC
  Filled 2017-08-18: qty 4

## 2017-08-18 MED ORDER — SIMETHICONE 80 MG PO CHEW
80.0000 mg | CHEWABLE_TABLET | Freq: Three times a day (TID) | ORAL | Status: DC
  Administered 2017-08-19 – 2017-08-20 (×4): 80 mg via ORAL
  Filled 2017-08-18 (×4): qty 1

## 2017-08-18 MED ORDER — SIMETHICONE 80 MG PO CHEW
80.0000 mg | CHEWABLE_TABLET | ORAL | Status: DC | PRN
Start: 2017-08-18 — End: 2017-08-20

## 2017-08-18 MED ORDER — MORPHINE SULFATE (PF) 0.5 MG/ML IJ SOLN
INTRAMUSCULAR | Status: DC | PRN
  Administered 2017-08-18: .1 mg via INTRATHECAL

## 2017-08-18 MED ORDER — IRON 90 (18 FE) MG PO TABS: ORAL_TABLET | Freq: Every day | ORAL | Status: DC

## 2017-08-18 MED ORDER — IBUPROFEN 600 MG PO TABS
600.0000 mg | ORAL_TABLET | Freq: Four times a day (QID) | ORAL | Status: DC
  Administered 2017-08-19 – 2017-08-20 (×5): 600 mg via ORAL
  Filled 2017-08-18 (×4): qty 1

## 2017-08-18 MED ORDER — PHENYLEPHRINE 8 MG IN D5W 100 ML (0.08MG/ML) PREMIX OPTIME
INJECTION | INTRAVENOUS | Status: DC | PRN
  Administered 2017-08-18: 60 ug/min via INTRAVENOUS

## 2017-08-18 MED ORDER — MEPERIDINE HCL 25 MG/ML IJ SOLN: 6.2500 mg | INTRAMUSCULAR | Status: DC | PRN

## 2017-08-18 MED ORDER — LACTATED RINGERS IV SOLN
INTRAVENOUS | Status: DC
  Administered 2017-08-19: 01:00:00 via INTRAVENOUS

## 2017-08-18 MED ORDER — ACETAMINOPHEN 325 MG PO TABS
650.0000 mg | ORAL_TABLET | ORAL | Status: DC | PRN
  Administered 2017-08-19 – 2017-08-20 (×3): 650 mg via ORAL
  Filled 2017-08-18 (×4): qty 2

## 2017-08-18 MED ORDER — KETOROLAC TROMETHAMINE 30 MG/ML IJ SOLN
30.0000 mg | Freq: Once | INTRAMUSCULAR | Status: AC
  Administered 2017-08-18: 30 mg via INTRAVENOUS

## 2017-08-18 MED ORDER — PHENYLEPHRINE 8 MG IN D5W 100 ML (0.08MG/ML) PREMIX OPTIME
INJECTION | INTRAVENOUS | Status: AC
  Filled 2017-08-18: qty 100

## 2017-08-18 MED ORDER — ACETAMINOPHEN 160 MG/5ML PO SOLN
975.0000 mg | Freq: Once | ORAL | Status: AC
  Administered 2017-08-18: 975 mg via ORAL
  Filled 2017-08-18: qty 40.6

## 2017-08-18 MED ORDER — KETOROLAC TROMETHAMINE 30 MG/ML IJ SOLN
INTRAMUSCULAR | Status: AC
  Filled 2017-08-18: qty 1

## 2017-08-18 MED ORDER — DIPHENHYDRAMINE HCL 25 MG PO CAPS: 25.0000 mg | ORAL_CAPSULE | Freq: Four times a day (QID) | ORAL | Status: DC | PRN

## 2017-08-18 MED ORDER — MEPERIDINE HCL 25 MG/ML IJ SOLN
INTRAMUSCULAR | Status: AC
  Administered 2017-08-18: 50 mg
  Filled 2017-08-18: qty 2

## 2017-08-18 MED ORDER — MORPHINE SULFATE (PF) 0.5 MG/ML IJ SOLN
INTRAMUSCULAR | Status: AC
  Filled 2017-08-18: qty 10

## 2017-08-18 MED ORDER — FERROUS SULFATE 325 (65 FE) MG PO TABS
325.0000 mg | ORAL_TABLET | Freq: Every day | ORAL | Status: DC
  Administered 2017-08-19: 325 mg via ORAL
  Filled 2017-08-18 (×2): qty 1

## 2017-08-18 MED ORDER — TETANUS-DIPHTH-ACELL PERTUSSIS 5-2.5-18.5 LF-MCG/0.5 IM SUSP
0.5000 mL | Freq: Once | INTRAMUSCULAR | Status: AC
  Administered 2017-08-19: 0.5 mL via INTRAMUSCULAR
  Filled 2017-08-18: qty 0.5

## 2017-08-18 MED ORDER — PRENATAL MULTIVITAMIN CH
1.0000 | ORAL_TABLET | Freq: Every day | ORAL | Status: DC
  Administered 2017-08-19: 1 via ORAL
  Filled 2017-08-18: qty 1

## 2017-08-18 MED ORDER — DIPHENHYDRAMINE HCL 50 MG/ML IJ SOLN
INTRAMUSCULAR | Status: DC | PRN
  Administered 2017-08-18: 12.5 mg via INTRAVENOUS

## 2017-08-18 MED ORDER — OXYTOCIN 10 UNIT/ML IJ SOLN
INTRAMUSCULAR | Status: AC
  Filled 2017-08-18: qty 4

## 2017-08-18 MED ORDER — ONDANSETRON HCL 4 MG/2ML IJ SOLN
INTRAMUSCULAR | Status: AC
  Filled 2017-08-18: qty 2

## 2017-08-18 MED ORDER — OXYTOCIN 40 UNITS IN LACTATED RINGERS INFUSION - SIMPLE MED: 2.5000 [IU]/h | INTRAVENOUS | Status: AC

## 2017-08-18 MED ORDER — SODIUM CHLORIDE 0.9% FLUSH: 3.0000 mL | INTRAVENOUS | Status: DC | PRN

## 2017-08-18 SURGICAL SUPPLY — 37 items
BENZOIN TINCTURE PRP APPL 2/3 (GAUZE/BANDAGES/DRESSINGS) ×2 IMPLANT
CHLORAPREP W/TINT 26ML (MISCELLANEOUS) ×2 IMPLANT
CLAMP CORD UMBIL (MISCELLANEOUS) IMPLANT
CLOTH BEACON ORANGE TIMEOUT ST (SAFETY) ×2 IMPLANT
DRSG OPSITE POSTOP 4X10 (GAUZE/BANDAGES/DRESSINGS) ×2 IMPLANT
ELECT REM PT RETURN 9FT ADLT (ELECTROSURGICAL) ×2
ELECTRODE REM PT RTRN 9FT ADLT (ELECTROSURGICAL) ×1 IMPLANT
EXTRACTOR VACUUM KIWI (MISCELLANEOUS) IMPLANT
EXTRACTOR VACUUM M CUP 4 TUBE (SUCTIONS) IMPLANT
GLOVE BIO SURGEON STRL SZ7 (GLOVE) ×2 IMPLANT
GLOVE BIOGEL PI IND STRL 7.0 (GLOVE) ×2 IMPLANT
GLOVE BIOGEL PI INDICATOR 7.0 (GLOVE) ×2
GOWN STRL REUS W/TWL LRG LVL3 (GOWN DISPOSABLE) ×4 IMPLANT
HOVERMATT SINGLE USE (MISCELLANEOUS) ×2 IMPLANT
KIT ABG SYR 3ML LUER SLIP (SYRINGE) IMPLANT
NEEDLE HYPO 25X5/8 SAFETYGLIDE (NEEDLE) IMPLANT
NS IRRIG 1000ML POUR BTL (IV SOLUTION) ×2 IMPLANT
PACK C SECTION WH (CUSTOM PROCEDURE TRAY) ×2 IMPLANT
PAD OB MATERNITY 4.3X12.25 (PERSONAL CARE ITEMS) ×2 IMPLANT
RETRACTOR TRAXI PANNICULUS (MISCELLANEOUS) ×1 IMPLANT
RTRCTR C-SECT PINK 25CM LRG (MISCELLANEOUS) IMPLANT
SPONGE LAP 18X18 X RAY DECT (DISPOSABLE) ×2 IMPLANT
STRIP CLOSURE SKIN 1/2X4 (GAUZE/BANDAGES/DRESSINGS) ×2 IMPLANT
SUT MON AB-0 CT1 36 (SUTURE) ×4 IMPLANT
SUT PLAIN 0 NONE (SUTURE) IMPLANT
SUT PLAIN 2 0 (SUTURE)
SUT PLAIN 2 0 XLH (SUTURE) ×2 IMPLANT
SUT PLAIN ABS 2-0 CT1 27XMFL (SUTURE) IMPLANT
SUT VIC AB 0 CT1 27 (SUTURE) ×2
SUT VIC AB 0 CT1 27XBRD ANBCTR (SUTURE) ×2 IMPLANT
SUT VIC AB 2-0 CT1 27 (SUTURE) ×1
SUT VIC AB 2-0 CT1 TAPERPNT 27 (SUTURE) ×1 IMPLANT
SUT VIC AB 4-0 KS 27 (SUTURE) ×2 IMPLANT
SUT VICRYL 0 TIES 12 18 (SUTURE) IMPLANT
TOWEL OR 17X24 6PK STRL BLUE (TOWEL DISPOSABLE) ×2 IMPLANT
TRAXI PANNICULUS RETRACTOR (MISCELLANEOUS) ×1
TRAY FOLEY BAG SILVER LF 14FR (SET/KITS/TRAYS/PACK) IMPLANT

## 2017-08-18 NOTE — Anesthesia Postprocedure Evaluation (Signed)
Anesthesia Post Note  Patient: Nicole Landry  Procedure(s) Performed: Repeat CESAREAN SECTION (N/A )     Patient location during evaluation: PACU Anesthesia Type: Spinal Level of consciousness: awake and alert Pain management: pain level controlled Vital Signs Assessment: post-procedure vital signs reviewed and stable Respiratory status: spontaneous breathing and respiratory function stable Cardiovascular status: blood pressure returned to baseline and stable Postop Assessment: spinal receding Anesthetic complications: no    Last Vitals:  Vitals:   08/18/17 1845 08/18/17 1900  BP: 120/68 (!) 90/57  Pulse: 99 (!) 118  Resp: 19 (!) 21  Temp:    SpO2: 97% 97%    Last Pain:  Vitals:   08/18/17 1900  TempSrc:   PainSc: 5    Pain Goal: Patients Stated Pain Goal: 5 (08/18/17 1845)               Kennieth RadFitzgerald, Ameisha Mcclellan E

## 2017-08-18 NOTE — Transfer of Care (Signed)
Immediate Anesthesia Transfer of Care Note  Patient: Nicole Landry  Procedure(s) Performed: Repeat CESAREAN SECTION (N/A )  Patient Location: PACU  Anesthesia Type:Spinal  Level of Consciousness: awake, alert , oriented and patient cooperative  Airway & Oxygen Therapy: Patient Spontanous Breathing  Post-op Assessment: Report given to RN and Post -op Vital signs reviewed and stable  Post vital signs: Reviewed and stable  Last Vitals:  Vitals:   08/18/17 1212  BP: 127/69  Pulse: 99  Resp: 16  Temp: 36.9 C  SpO2: 100%    Last Pain:  Vitals:   08/18/17 1212  TempSrc: Oral         Complications: No apparent anesthesia complications

## 2017-08-18 NOTE — Op Note (Addendum)
Cesarean Section Procedure Note Nicole Landry Buchinger  08/18/2017  Indications: Scheduled Proceedure/Maternal Request 39.1 weeks   Procedure: Repeat Low Transverse Cesarean section  Pre-operative Diagnosis: Previous Cesarean Section, maternal request.   Post-operative Diagnosis: Same   Surgeon: Shea EvansMody, Tsion Inghram, MD  Assistants: Marlinda Mikeanya Bailey, CNM  Anesthesia: spinal   Procedure Details:  The patient was seen in the Holding Room. The risks, benefits, complications, treatment options, and expected outcomes were discussed with the patient. The patient concurred with the proposed plan, giving informed consent. identified as Nicole Landry Mcallister and the procedure verified as C-Section Delivery. A Time Out was held and the above information confirmed. 2 gm Ancef given.  After induction of anesthesia, the patient was draped and prepped in the usual sterile manner and foley was placed. A Pfannenstiel Incision was made and carried down through the subcutaneous tissue to the fascia. Fascial incision was made and extended transversely. The fascia was separated from the underlying rectus tissue superiorly and inferiorly. The peritoneum was identified and entered. Peritoneal incision was extended longitudinally. No adhesions noted.  Alexis retractor was placed.  A low transverse uterine incision was made. Lower segment was thick and vascular. Amniotic fluid was meconium stained and turbid. Delivered from cephalic presentation was a vigorous FEMALE infant at 2.52 pm. Apgars 8 and 9. Delayed cord clamping done at 1 minute and infant handed to NICU team in attendance.   Cord ph was not sent. Cord blood was obtained for evaluation. The placenta was removed Intact and appeared abnormal - foul odor. The uterine outline, tubes and ovaries appeared normal}. The uterine incision was closed with running locked sutures of 0Monocryl followed by a second imbricating layer.  Hemostasis was observed. Alexis removed. Peritoneal closure  with 2-0 Vicryl.  The fascia was then reapproximated with running sutures of 0Vicryl. The subcuticular closure was performed using 2-0plain gut. The skin was closed with 4-0Vicryl. steristrips and honeycomb dressing placed.   Instrument, sponge, and needle counts were correct prior the abdominal closure and were correct at the conclusion of the case.   Findings: Thick lower segment with increased vascularity. Amniotic fluid with meconium staining and also turbid appearance with odor when placenta was removed. Baby BOY was delivered cephalic and delivery was smooth. Delayed cord clamping done at 1 minute, Apgars 8 and 9 at 1 and 5 minutes.    Estimated Blood Loss: 900 mL   Total IV Fluids: 2400 ml LR   Urine Output: 50CC OF clear urine  Specimens: cord blood  Complications: no complications  Disposition: PACU - hemodynamically stable.   Maternal Condition: stable   Baby condition / location:  Couplet care / Skin to Skin  Attending Attestation: I performed the procedure.   Signed: Surgeon(Landry): Shea EvansMody, Honor Fairbank, MD

## 2017-08-18 NOTE — Anesthesia Preprocedure Evaluation (Signed)
Anesthesia Evaluation  Patient identified by MRN, date of birth, ID band Patient awake    Reviewed: Allergy & Precautions, H&P , Patient's Chart, lab work & pertinent test results  Airway Mallampati: II  TM Distance: >3 FB Neck ROM: full    Dental no notable dental hx.    Pulmonary    Pulmonary exam normal breath sounds clear to auscultation       Cardiovascular Exercise Tolerance: Good  Rhythm:regular Rate:Normal     Neuro/Psych    GI/Hepatic   Endo/Other  diabetes, GestationalMorbid obesity  Renal/GU      Musculoskeletal   Abdominal   Peds  Hematology   Anesthesia Other Findings   Reproductive/Obstetrics                             Anesthesia Physical Anesthesia Plan  ASA: III  Anesthesia Plan: Spinal   Post-op Pain Management:    Induction:   PONV Risk Score and Plan:   Airway Management Planned:   Additional Equipment:   Intra-op Plan:   Post-operative Plan:   Informed Consent: I have reviewed the patients History and Physical, chart, labs and discussed the procedure including the risks, benefits and alternatives for the proposed anesthesia with the patient or authorized representative who has indicated his/her understanding and acceptance.     Plan Discussed with:   Anesthesia Plan Comments: (  )        Anesthesia Quick Evaluation

## 2017-08-18 NOTE — Progress Notes (Signed)
PTs wishes are not to have LC come in room. Pt said " I had a bad experience last time" Nurse should ask the Pt before touching her breast please.

## 2017-08-18 NOTE — Progress Notes (Signed)
This RN at bedside when Mother trying to breastfeed her baby. Encouraged her to ask for help. Mother advised to use hand expression with spoon or finger if baby does not latch. Mother does not want LC help

## 2017-08-18 NOTE — Anesthesia Procedure Notes (Signed)
Spinal  Patient location during procedure: OR Staffing Anesthesiologist: Cristela BlueJackson, Hudsen Fei, MD Spinal Block Patient position: sitting Prep: DuraPrep Patient monitoring: heart rate, blood pressure and continuous pulse ox Approach: right paramedian Location: L3-4 Injection technique: single-shot Needle Needle type: Sprotte  Needle gauge: 24 G Needle length: 9 cm Assessment Sensory level: T4 Additional Notes Spinal Dosage in OR  .75% Bupivicaine ml       1.4     [id number]   mcg        100    Fentanyl mcg            25

## 2017-08-19 ENCOUNTER — Other Ambulatory Visit: Payer: Self-pay

## 2017-08-19 ENCOUNTER — Encounter (HOSPITAL_COMMUNITY): Payer: Self-pay | Admitting: Obstetrics & Gynecology

## 2017-08-19 DIAGNOSIS — D62 Acute posthemorrhagic anemia: Secondary | ICD-10-CM | POA: Diagnosis not present

## 2017-08-19 LAB — CBC
HCT: 28.6 % — ABNORMAL LOW (ref 36.0–46.0)
Hemoglobin: 9.5 g/dL — ABNORMAL LOW (ref 12.0–15.0)
MCH: 28.2 pg (ref 26.0–34.0)
MCHC: 33.2 g/dL (ref 30.0–36.0)
MCV: 84.9 fL (ref 78.0–100.0)
PLATELETS: 231 10*3/uL (ref 150–400)
RBC: 3.37 MIL/uL — ABNORMAL LOW (ref 3.87–5.11)
RDW: 14.8 % (ref 11.5–15.5)
WBC: 11.7 10*3/uL — ABNORMAL HIGH (ref 4.0–10.5)

## 2017-08-19 MED ORDER — FERROUS SULFATE 325 (65 FE) MG PO TABS
325.0000 mg | ORAL_TABLET | Freq: Every day | ORAL | Status: DC
  Administered 2017-08-20: 325 mg via ORAL

## 2017-08-19 NOTE — Progress Notes (Signed)
G3P3, Postpartum Day 1, Repeat Cesarean Delivery on 2/13 2.52 pm BOY Subjective:  Burning in incision area. Voiding well. No flatus, tolerated small general diet last night, not ambulated much.   Objective: Vital signs in last 24 hours: Temp:  [98.5 F (36.9 C)-98.9 F (37.2 C)] 98.7 F (37.1 C) (02/14 0650) Pulse Rate:  [66-118] 85 (02/14 0650) Resp:  [10-22] 18 (02/14 0650) BP: (90-139)/(54-109) 104/54 (02/14 0650) SpO2:  [95 %-100 %] 98 % (02/14 0650) Weight:  [238 lb 13 oz (108.3 kg)] 238 lb 13 oz (108.3 kg) (02/13 1212)  Physical Exam:  General: alert and cooperative  Lungs CTA bil CV RRR Lochia: appropriate Uterine Fundus: firm Incision: dressing dry DVT Evaluation: Negative Homan's sign.  Recent Labs    08/17/17 0835 08/19/17 0504  HGB 12.1 9.5*  HCT 37.3 28.6*   O(+) Rub Immune  Assessment/Plan: Status post Cesarean section. Doing well postoperatively. with appropriate discomfort. Continue PO pain meds as needed Ambulate ABL - Iron  Breast feeding  BOY, wit mother, NO circ -pt declines.   Robley FriesVaishali R Zachari Alberta 08/19/2017, 9:31 AM

## 2017-08-19 NOTE — Anesthesia Postprocedure Evaluation (Signed)
Anesthesia Post Note  Patient: Nicole Landry  Procedure(s) Performed: Repeat CESAREAN SECTION (N/A )     Patient location during evaluation: Mother Baby Anesthesia Type: Spinal Level of consciousness: awake, awake and alert, oriented and patient cooperative Pain management: satisfactory to patient (one-sided pain, offered heat or ice to patient; patient asked for heat therepy for pain control. MB RN, Irving Burtonmily, made aware of patient's desire for heat packs/pain control.) Vital Signs Assessment: post-procedure vital signs reviewed and stable Respiratory status: spontaneous breathing, nonlabored ventilation and respiratory function stable Cardiovascular status: stable Postop Assessment: no headache, no backache, patient able to bend at knees and no apparent nausea or vomiting Anesthetic complications: no    Last Vitals:  Vitals:   08/19/17 0305 08/19/17 0650  BP: 113/62 (!) 104/54  Pulse: 88 85  Resp: 18 18  Temp: 37.2 C 37.1 C  SpO2: 98% 98%    Last Pain:  Vitals:   08/19/17 0650  TempSrc: Oral  PainSc: 4    Pain Goal: Patients Stated Pain Goal: 5 (08/18/17 2300)               Malisa Ruggiero L

## 2017-08-19 NOTE — Addendum Note (Signed)
Addendum  created 08/19/17 0829 by Yolonda Kidaarver, Vladimir Lenhoff L, CRNA   Sign clinical note

## 2017-08-19 NOTE — Lactation Note (Signed)
This note was copied from a baby's chart. Lactation Consultation Note Mom does not want Lactation to visit.!!  Patient Name: Nicole Landry [id number]'s Date: 08/19/2017     Maternal Data    Feeding Feeding Type: Breast Fed Length of feed: 15 min  LATCH Score                   Interventions    Lactation Tools Discussed/Used     Consult Status      [name] G 08/19/2017, 3:38 AM

## 2017-08-20 MED ORDER — IBUPROFEN 600 MG PO TABS: 600.0000 mg | ORAL_TABLET | Freq: Four times a day (QID) | ORAL | 0 refills | Status: DC

## 2017-08-20 NOTE — Discharge Summary (Signed)
Obstetric Discharge Summary   Patient Name: Nicole Landry DOB: 1985-09-20 MRN: [id number]  Date of Admission: 08/18/2017 Date of Discharge: 08/20/2017 Date of Delivery: 08/18/17 Gestational Age at Delivery: 39 weeks  Primary OB: [facility] - Dr. 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 Postpartum care following cesarean delivery (2/13) 08/19/2017  . Postoperative anemia due to acute blood loss 08/19/2017  . S/P repeat low transverse C-section 08/18/2017    Date of Delivery: 08/18/17 Delivered By: Dr. Mody/ T. Fredric Mare, CNM assist Delivery Type: repeat cesarean section, low transverse incision  Newborn Data: Live born female  Birth Weight: 8 lb 15.2 oz (4060 g) APGAR: 8, 9  Newborn Delivery   Birth date/time:  08/18/2017 14:52:00 Delivery type:  C-Section, Low Transverse C-section categorization:  Repeat     Hospital/Postpartum Course  (Cesarean Section):  Pt. Admitted for elective repeat LTCS. Patient had an uncomplicated postpartum course.  By time of discharge on POD#2, her pain was controlled on oral pain medications; she had appropriate lochia and was ambulating, voiding without difficulty, tolerating regular diet and passing flatus.   She was deemed stable for discharge to home.     Labs: CBC Latest Ref Rng & Units 08/19/2017 08/17/2017 07/19/2017  WBC 4.0 - 10.5 K/uL 11.7(H) 6.9 11.5(H)  Hemoglobin 12.0 - 15.0 g/dL 6.4(Q) 03.4 74.2  Hematocrit 36.0 - 46.0 % 28.6(L) 37.3  37.8  Platelets 150 - 400 K/uL 231 274 247   O POS  Physical exam:  BP 119/61 (BP Location: Left Arm)   Pulse 89   Temp 98.3 F (36.8 C) (Oral)   Resp 20   Ht 5\' 3"  (1.6 m)   Wt 108.3 kg (238 lb 13 oz)   SpO2 98%   Breastfeeding? Unknown   BMI 42.30 kg/m  General: alert and no distress Pulm: normal respiratory effort Lochia: appropriate Abdomen: soft, NT Uterine Fundus: firm, below umbilicus Perineum: healing well, no significant erythema, no significant edema Incision: c/d/i, healing well, no significant drainage, no dehiscence, no significant erythema Extremities: No evidence of DVT seen on physical exam. No lower extremity edema.   Disposition: stable, discharge to home Baby Feeding: breast milk Baby Disposition: home with mom  Contraception: none  Rh Immune globulin given: N/A Rubella vaccine given: N/A Tdap vaccine given in AP or PP setting: UTD Flu vaccine given in AP or PP setting: none on file    Plan:  Nicole Landry was discharged to home in good condition. Follow-up appointment at [facility] in 2 or 6 weeks.  Discharge Instructions: Per After Visit Summary. Refer to After Visit Summary and [facility] discharge booklet  Activity: Advance as tolerated. Pelvic rest for 6 weeks.   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     Vicodin [hydrocodone-acetaminophen] Other (See Comments)   Reaction:  Seizures; has tolerated acetaminophen and other narcotics in  the past      Medication List    STOP taking these medications   promethazine 25 MG tablet Commonly known as:  PHENERGAN     TAKE these medications   ibuprofen 600 MG tablet Commonly known as:  ADVIL,MOTRIN Take 1 tablet (600 mg total) by mouth every 6 (six) hours.   IRON PO Take 1 tablet by mouth daily.   prenatal multivitamin Tabs tablet Take  1 tablet by mouth daily at 12 noon.      Outpatient follow up:  Follow-up Information    Shea EvansMody, Vaishali, MD. Schedule an appointment as soon as possible for a visit in 2 week(s).   Specialty:  Obstetrics and Gynecology Why:  Postpartum visit; then at 6 weeks Contact information: [address] [place] [address] [phone]           Signed:  Carlean JewsMeredith Chalonda Schlatter, MSN, CNM [facility]

## 2017-08-20 NOTE — Progress Notes (Signed)
POSTOPERATIVE DAY # 2 S/P Repeat LTCS, baby boy   S:         Reports feeling okay, having some incisional pain, but desires early discharge today.  Reports concerns for having some numbness in her bottom from anesthesia - denies weakness in legs and has feeling in her legs bilaterally. Reports she does not feel like she needs to see anesthesia prior to discharge.  Also reported some SOB late yesterday after completely emptying her bladder, however she said she felt slightly dizzy at the time, denies s/s of further SOB or CP.              Tolerating po intake / no nausea / no vomiting / + flatus / no BM  Denies dizziness,              Bleeding is moderate; reports some small clots              Pain controlled with Motrin             Up ad lib / ambulatory/ voiding QS  Newborn breast feeding - states she can only feed on the right breast, but states she has an oversupply of milk.  Has never been able to feed on left side  / Circumcision - declined   O:  VS: BP 119/61 (BP Location: Left Arm)   Pulse 89   Temp 98.3 F (36.8 C) (Oral)   Resp 20   Ht 5\' 3"  (1.6 m)   Wt 108.3 kg (238 lb 13 oz)   SpO2 98%   Breastfeeding? Unknown   BMI 42.30 kg/m    LABS:               Recent Labs    08/19/17 0504  WBC 11.7*  HGB 9.5*  PLT 231               Bloodtype: --/--/O POS (02/12 0835)  Rubella: Immune (07/27 0000)                                             I&O: Intake/Output      02/14 0701 - 02/15 0700 02/15 0701 - 02/16 0700   P.O.     I.V. (mL/kg)     Total Intake(mL/kg)     Urine (mL/kg/hr) 1175 (0.5)    Blood     Total Output 1175    Net -1175                      Physical Exam:             Alert and Oriented X3  Lungs: Clear and unlabored  Heart: regular rate and rhythm / no murmurs  Abdomen: soft, non-tender, non-distended, active bowel sounds              Fundus: firm, non-tender, U-1 (slightly to right)             Dressing: honeycomb dsg with steri strips    Incision:  approximated with sutures / no erythema / no ecchymosis / no drainage  Perineum: intact  Lochia: small, no clots   Extremities: no edema, no calf pain or tenderness,   A:        POD # 2 S/P Repeat LTCS            ABL Anemia -  stable asymptomatic   Hx. Of UTI in pregnancy - was on suppression Macrobid  P:        Routine postoperative care              Continue home oral FE  Discharge home today  [facility] discharge book given   Per pt. Dr. Juliene PinaMody states she can take Macrobid for 2 weeks PP  F/u with Dr. Juliene PinaMody in 2 or 6 weeks   Carlean JewsMeredith Sigmon, MSN, [facility]

## 2018-02-18 IMAGING — US US MFM OB DETAIL+14 WK
1 series · 14 of 28 positions shown · non-contrast
Comparison: none

[Series 1: us mfm ob detail+14 wk · 87 acquisitions, 14 frames shown]
[im 4/87]
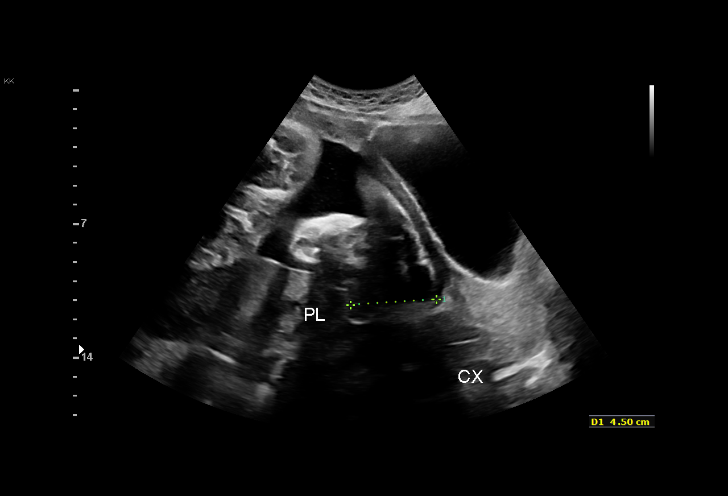
[im 10/87]
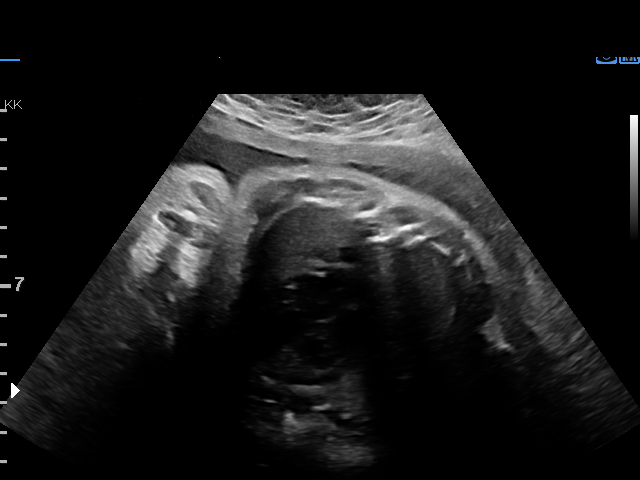
[im 16/87]
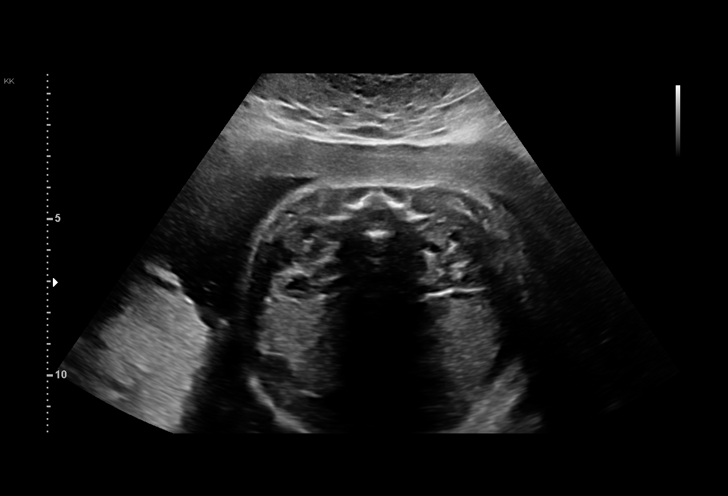
[im 23/87]
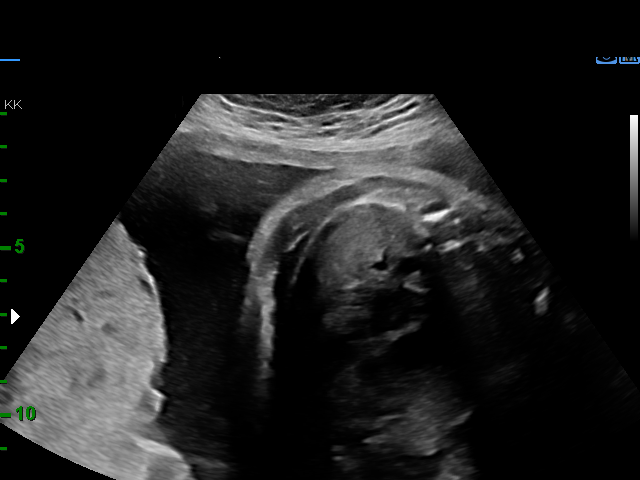
[im 29/87]
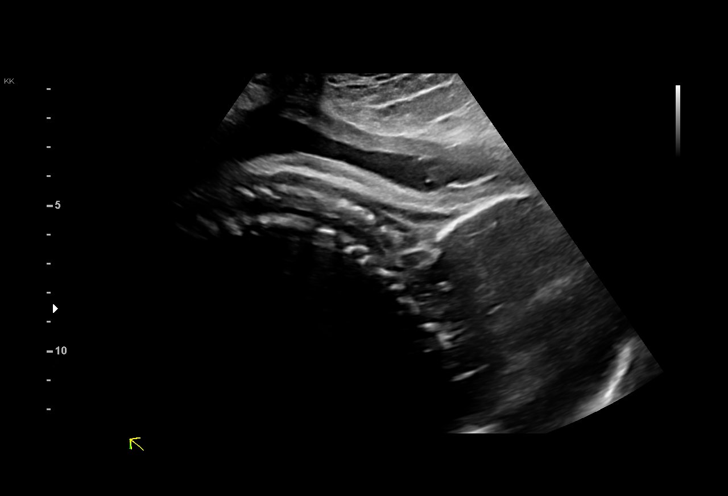
[im 36/87]
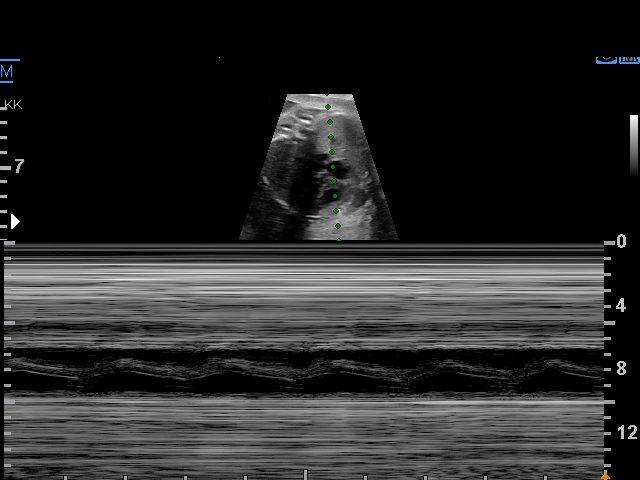
[im 42/87]
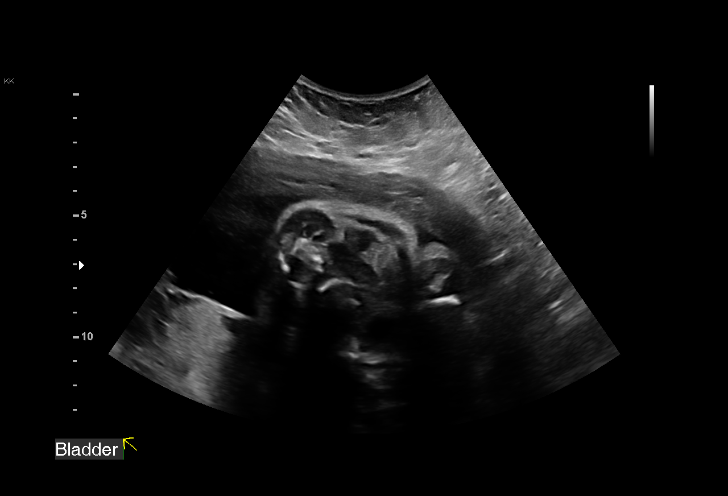
[im 48/87]
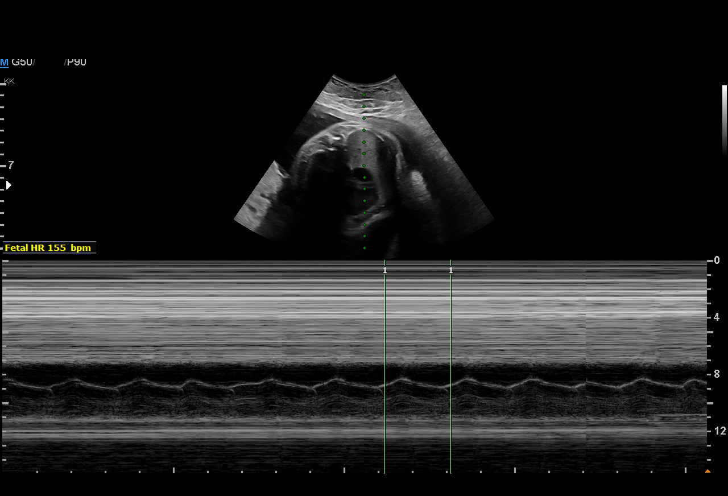
[im 55/87]
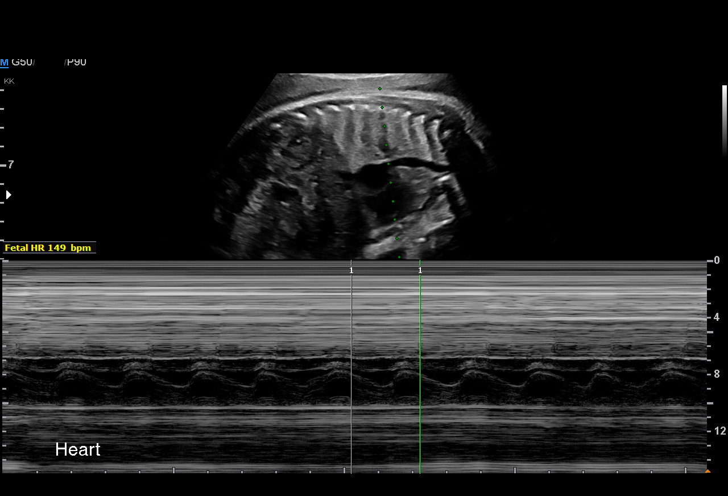
[im 61/87]
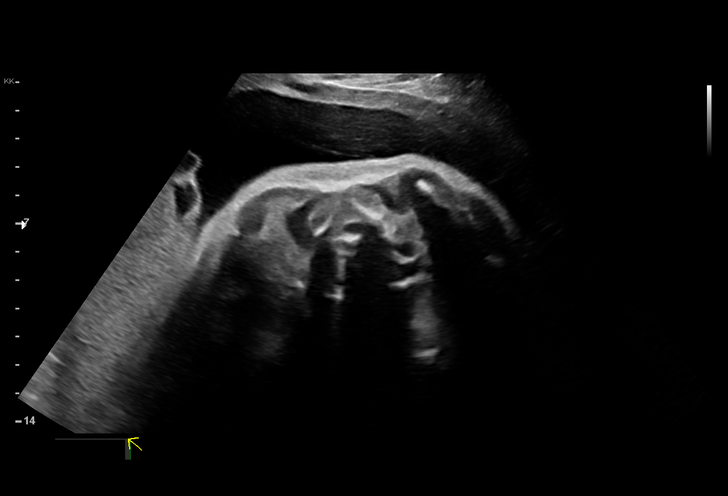
[im 67/87]
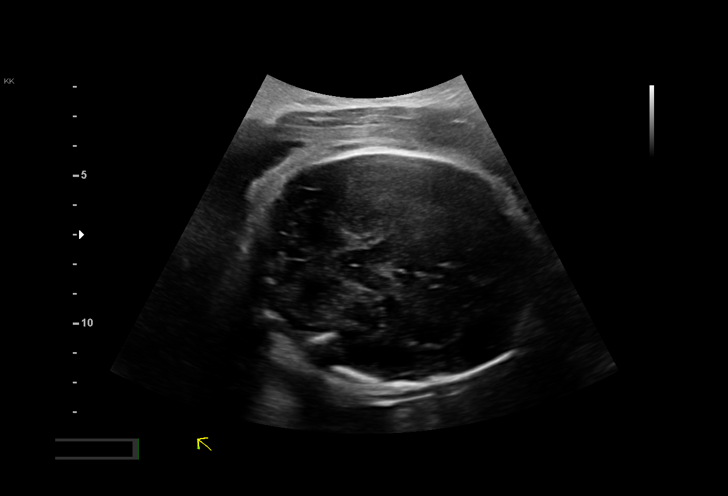
[im 74/87]
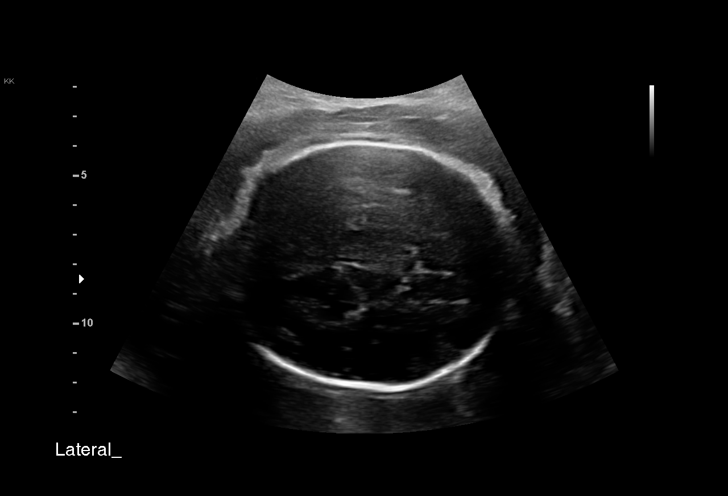
[im 80/87]
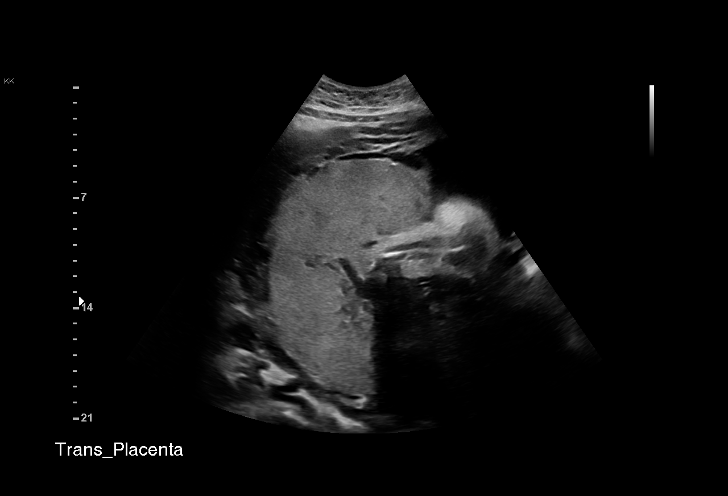
[im 87/87]
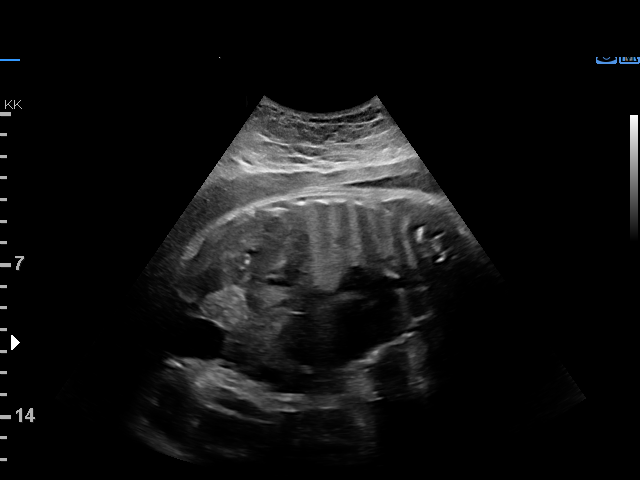

[14 of 28 positions shown; findings below may reference images not displayed]

OB/GYN &
Infertility
[id number] [HOSPITAL]
Attending:        Yaba Jean Baptiste Bognini       Secondary Phy.:   3rd Nursing- [employer]
OB

1  GIORGI JUMPER           [id number]      [id number]     [id number]
Indications

31 weeks gestation of pregnancy
Encounter for antenatal screening for
malformations
Obesity complicating pregnancy, third
trimester
Poor obstetric history: Previous gestational
diabetes
Medical complication of pregnancy (specify)
Maternal pyelonephritis
OB History

Blood Type:            Height:  5'3"   Weight (lb):  246       BMI:
Gravidity:    5         Term:   2        Prem:   0        SAB:   1
TOP:          0       Ectopic:  1        Living: 2
Fetal Evaluation

Num Of Fetuses:     1
Fetal Heart         153
Rate(bpm):
Cardiac Activity:   Arrhythmia noted
Presentation:       Transverse, head to maternal left
Placenta:           Posterior, above cervical os
P. Cord Insertion:  Not well visualized
Amniotic Fluid
AFI FV:      Subjectively within normal limits

AFI Sum(cm)     %Tile       Largest Pocket(cm)
16.86           61

RUQ(cm)                     LUQ(cm)        LLQ(cm)
5.91
Biometry

BPD:      81.7  mm     G. Age:  32w 6d         70  %    CI:        70.43   %    70 - 86
FL/HC:      19.9   %    19.1 -
HC:      310.4  mm     G. Age:  34w 4d         85  %    HC/AC:      0.99        0.96 -
AC:      312.2  mm     G. Age:  35w 1d       > 97  %    FL/BPD:     75.8   %    71 - 87
FL:       61.9  mm     G. Age:  32w 0d         44  %    FL/AC:      19.8   %    20 - 24

Est. FW:    1888  gm      5 lb 2 oz     85  %
Gestational Age

LMP:           33w 6d        Date:  11/03/16                 EDD:   08/10/17
U/S Today:     33w 5d                                        EDD:   08/11/17
Best:          31w 6d     Det. By:  Early Ultrasound         EDD:   08/24/17
(01/01/17)
Anatomy

Cranium:               Appears normal         Aortic Arch:            Appears normal
Cavum:                 Appears normal         Ductal Arch:            Not well visualized
Ventricles:            Appears normal         Diaphragm:              Not well visualized
Choroid Plexus:        Appears normal         Stomach:                Appears normal, left
sided
Cerebellum:            Appears normal         Abdomen:                Appears normal
Posterior Fossa:       Not well visualized    Abdominal Wall:         Not well visualized
Nuchal Fold:           Not applicable (>20    Cord Vessels:           Appears normal (3
wks GA)                                        vessel cord)
Face:                  Not well visualized    Kidneys:                Appear normal
Lips:                  Not well visualized    Bladder:                Appears normal
Thoracic:              Appears normal         Spine:                  Appears normal
Heart:                 Appears normal         Upper Extremities:      Not well visualized
(4CH, axis, and situs
RVOT:                  Appears normal         Lower Extremities:      Not well visualized
LVOT:                  Appears normal

Other:  Technically difficult due to advanced GA and fetal position.
Cervix Uterus Adnexa

Cervix
Normal appearance by transabdominal scan.
Impression

SIUP at 31+6 weeks
Normal detailed fetal anatomy; limited views of PF, face, DA,
diaphragm, CI and details of extremities; normal sinus rhythm
with very infrequent ectopy
Normal amniotic fluid volume
Measurements consistent with early US; EFW at the 85th
%tile; AC > 97th %tile
Recommendations

Follow-up ultrasounds as clinically indicated.

## 2018-02-19 IMAGING — US US RENAL
1 series · 15 of 25 positions shown · non-contrast
Comparison: None.

CLINICAL DATA: Right flank pain, pyelonephritis in pregnancy.

EXAM:
RENAL / URINARY TRACT ULTRASOUND COMPLETE

[Series 1: us renal · 15 of 43 slices shown]
[im 1/43]
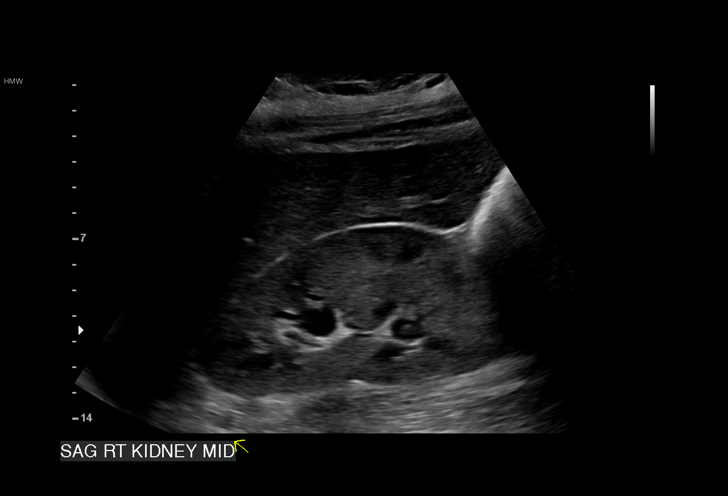
[im 4/43]
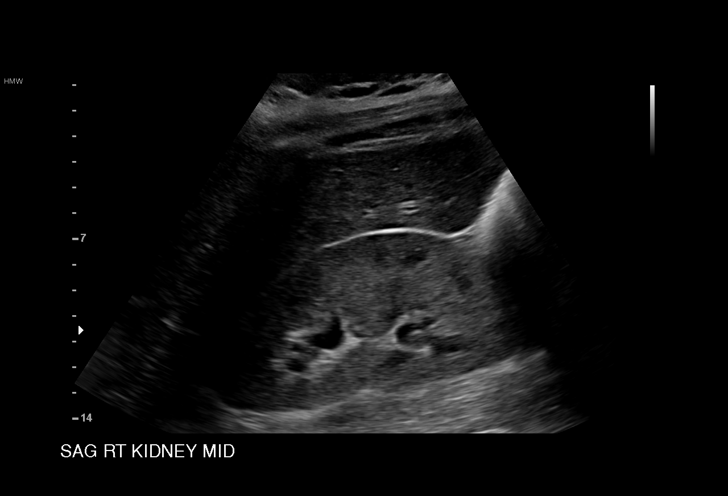
[im 8/43]
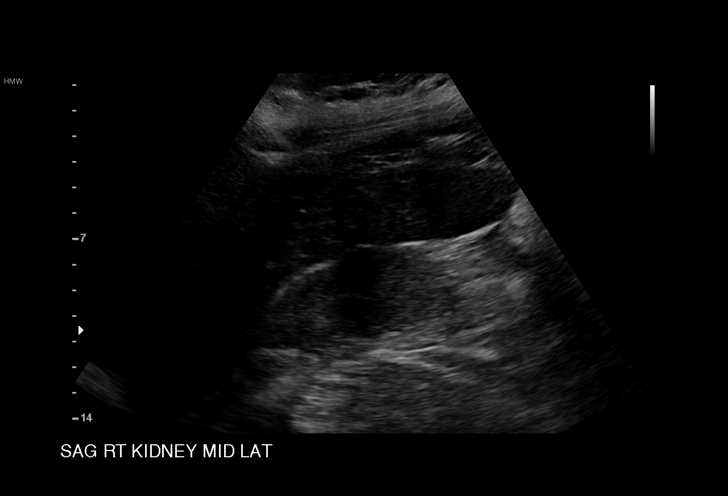
[im 9/43]
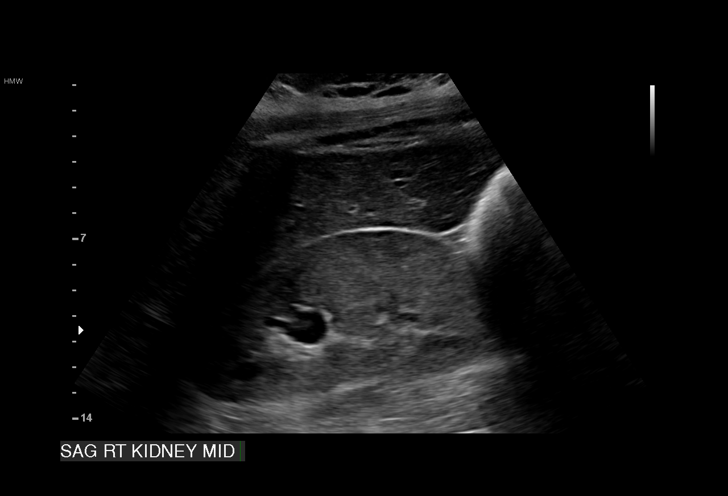
[im 13/43]
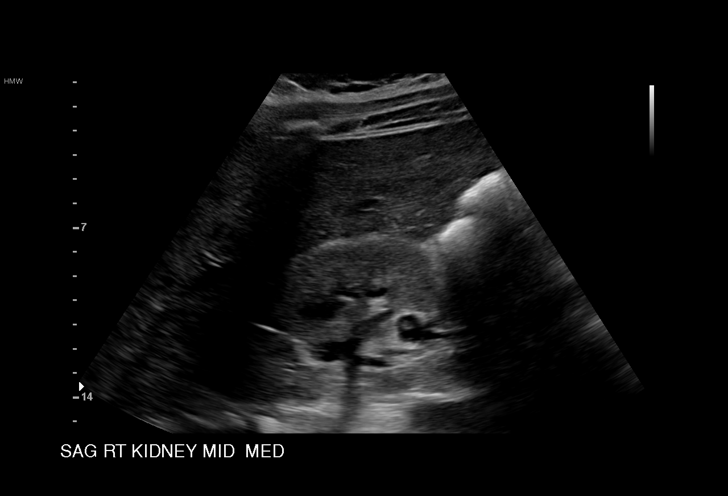
[im 16/43]
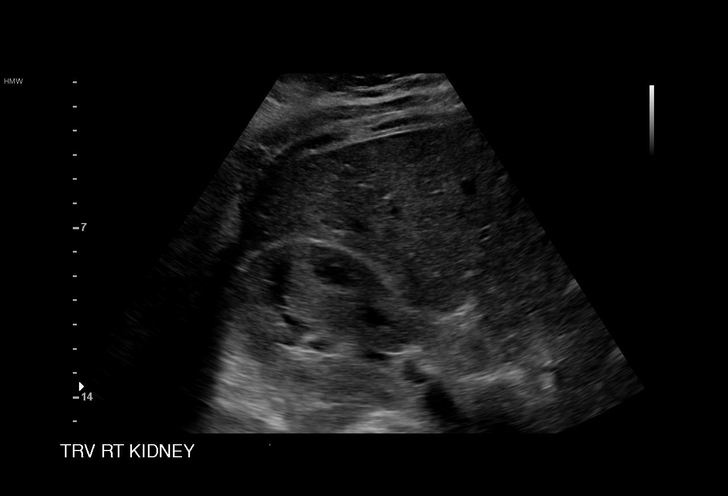
[im 18/43]
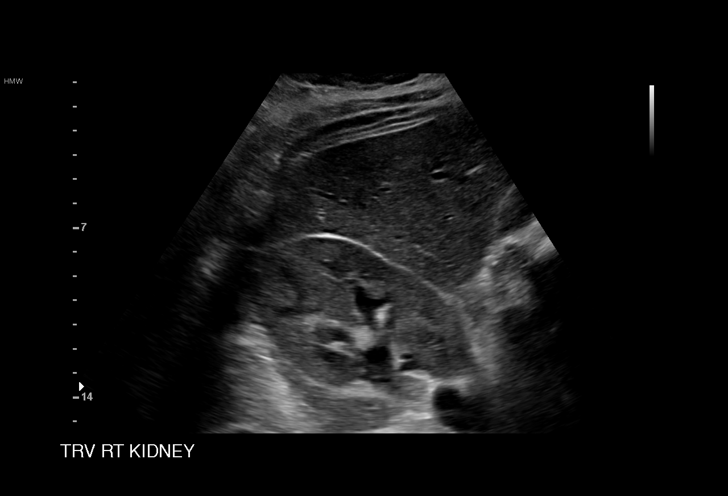
[im 22/43]
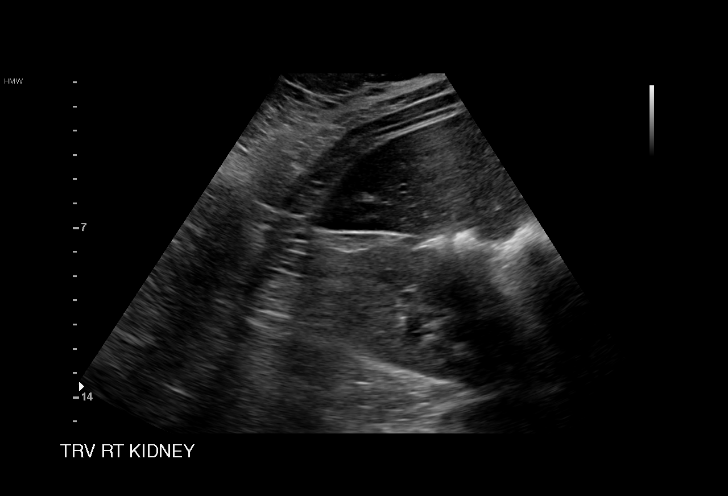
[im 25/43]
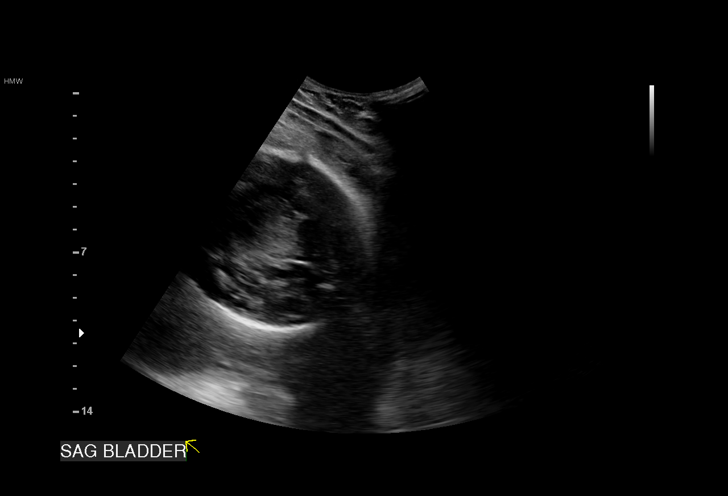
[im 27/43]
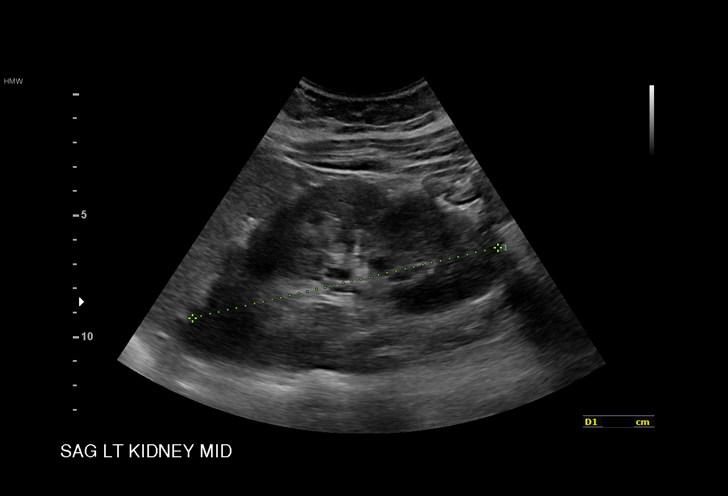
[im 30/43]
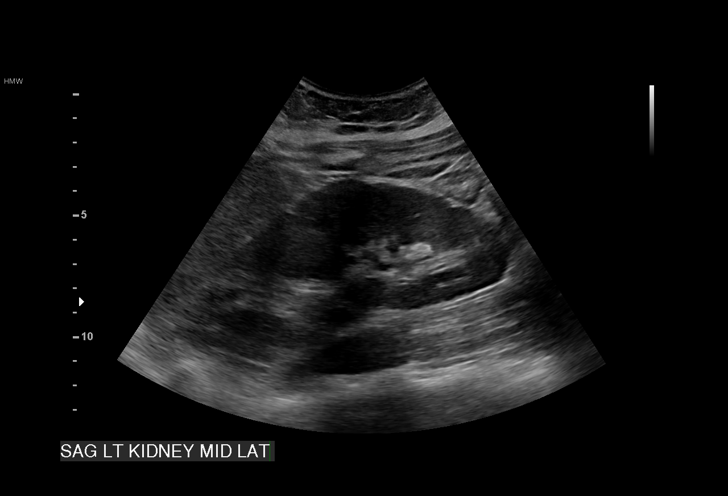
[im 34/43]
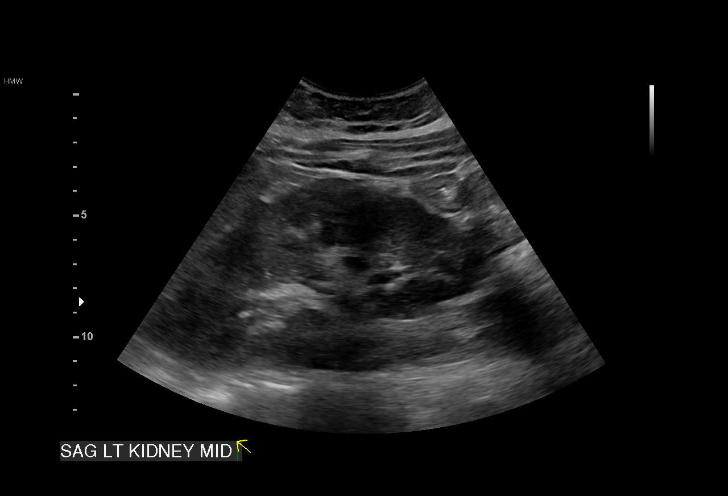
[im 36/43]
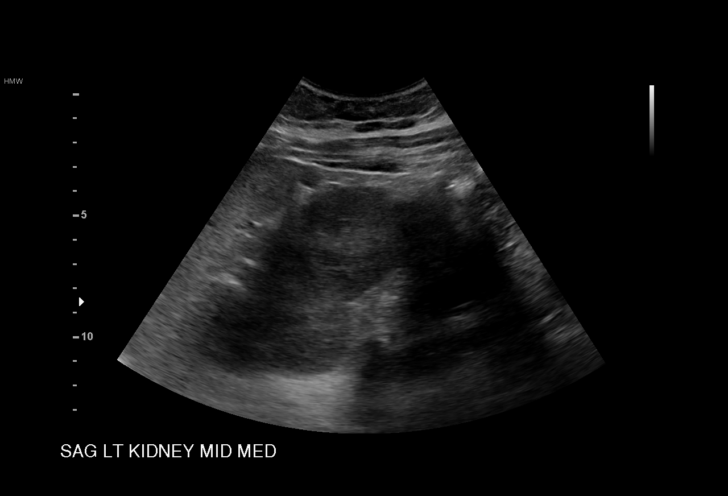
[im 39/43]
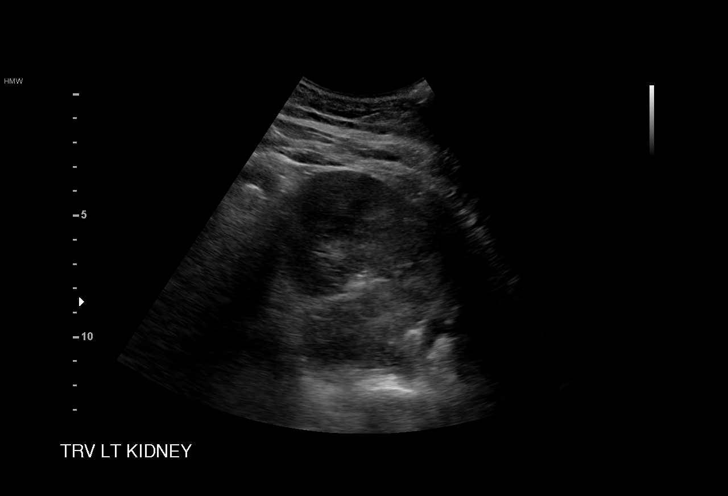
[im 43/43]
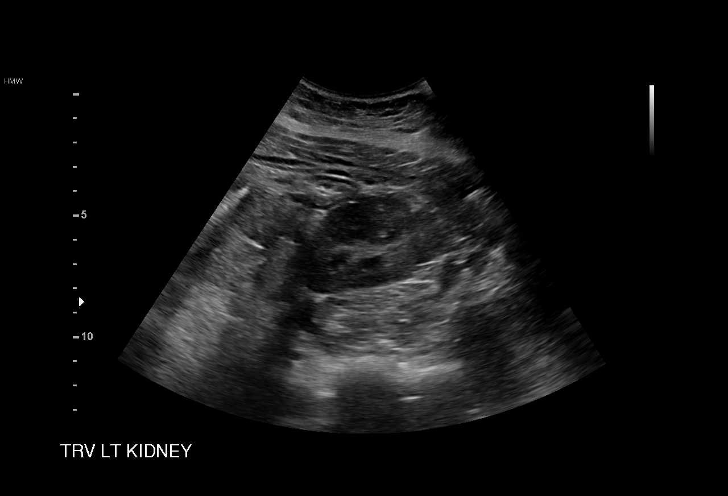

[15 of 25 positions shown; findings below may reference images not displayed]

FINDINGS: Right Kidney:

Length: 11.8 cm. Echogenicity within normal limits. No mass
visualized. Mild right hydronephrosis is noted.

Left Kidney:

Length: 12.9 cm. Echogenicity within normal limits. No mass or
hydronephrosis visualized.

Bladder:

Urinary bladder is not well visualized due to overlying fetus and
lack of distension.
IMPRESSION: Mild right hydronephrosis.  No other abnormality is noted.

## 2018-08-13 ENCOUNTER — Inpatient Hospital Stay (HOSPITAL_COMMUNITY): Payer: BLUE CROSS/BLUE SHIELD

## 2018-08-13 ENCOUNTER — Encounter (HOSPITAL_COMMUNITY): Payer: Self-pay | Admitting: *Deleted

## 2018-08-13 ENCOUNTER — Inpatient Hospital Stay (HOSPITAL_COMMUNITY)
Admission: AD | Admit: 2018-08-13 | Discharge: 2018-08-13 | Disposition: A | Discharge status: Home / Self Care | Payer: BLUE CROSS/BLUE SHIELD | Source: Ambulatory Visit | Attending: Obstetrics & Gynecology | Admitting: Obstetrics & Gynecology

## 2018-08-13 DIAGNOSIS — N840 Polyp of corpus uteri: Secondary | ICD-10-CM | POA: Diagnosis not present

## 2018-08-13 DIAGNOSIS — O26851 Spotting complicating pregnancy, first trimester: Secondary | ICD-10-CM | POA: Diagnosis not present

## 2018-08-13 DIAGNOSIS — O200 Threatened abortion: Secondary | ICD-10-CM

## 2018-08-13 DIAGNOSIS — Z3A00 Weeks of gestation of pregnancy not specified: Secondary | ICD-10-CM | POA: Diagnosis not present

## 2018-08-13 DIAGNOSIS — O209 Hemorrhage in early pregnancy, unspecified: Secondary | ICD-10-CM | POA: Diagnosis not present

## 2018-08-13 DIAGNOSIS — O3680X0 Pregnancy with inconclusive fetal viability, not applicable or unspecified: Secondary | ICD-10-CM | POA: Diagnosis not present

## 2018-08-13 DIAGNOSIS — Z3A01 Less than 8 weeks gestation of pregnancy: Secondary | ICD-10-CM

## 2018-08-13 LAB — CBC WITH DIFFERENTIAL/PLATELET
BASOS ABS: 0 10*3/uL (ref 0.0–0.1)
Basophils Relative: 0 %
EOS PCT: 0 %
Eosinophils Absolute: 0 10*3/uL (ref 0.0–0.5)
HCT: 33.7 % — ABNORMAL LOW (ref 36.0–46.0)
Hemoglobin: 10.7 g/dL — ABNORMAL LOW (ref 12.0–15.0)
LYMPHS ABS: 1.8 10*3/uL (ref 0.7–4.0)
LYMPHS PCT: 25 %
MCH: 27.5 pg (ref 26.0–34.0)
MCHC: 31.8 g/dL (ref 30.0–36.0)
MCV: 86.6 fL (ref 80.0–100.0)
Monocytes Absolute: 0.2 10*3/uL (ref 0.1–1.0)
Monocytes Relative: 3 %
NEUTROS ABS: 5.2 10*3/uL (ref 1.7–7.7)
NRBC: 0 % (ref 0.0–0.2)
Neutrophils Relative %: 72 %
PLATELETS: 384 10*3/uL (ref 150–400)
RBC: 3.89 MIL/uL (ref 3.87–5.11)
RDW: 14.3 % (ref 11.5–15.5)
WBC: 7.2 10*3/uL (ref 4.0–10.5)

## 2018-08-13 LAB — URINALYSIS, ROUTINE W REFLEX MICROSCOPIC
GLUCOSE, UA: NEGATIVE mg/dL
KETONES UR: NEGATIVE mg/dL
LEUKOCYTES UA: NEGATIVE
Nitrite: NEGATIVE
PH: 6 (ref 5.0–8.0)
Protein, ur: 30 mg/dL — AB

## 2018-08-13 LAB — URINALYSIS, MICROSCOPIC (REFLEX)
Bacteria, UA: NONE SEEN
RBC / HPF: >50 RBC/hpf (ref 0–5)

## 2018-08-13 LAB — POCT PREGNANCY, URINE: Preg Test, Ur: POSITIVE — AB

## 2018-08-13 LAB — HCG, QUANTITATIVE, PREGNANCY: HCG, BETA CHAIN, QUANT, S: 1593 m[IU]/mL — AB (ref <5)

## 2018-08-13 NOTE — Discharge Instructions (Signed)
Threatened Miscarriage  A threatened miscarriage occurs when a woman has vaginal bleeding during the first 20 weeks of pregnancy but the pregnancy has not ended. If you have vaginal bleeding during this time, your health care provider will do tests to make sure you are still pregnant. If the tests show that you are still pregnant and that the developing baby (fetus) inside your uterus is still growing, your condition is considered a threatened miscarriage. A threatened miscarriage does not mean your pregnancy will end, but it does increase the risk of losing your pregnancy (complete miscarriage). What are the causes? The cause of this condition is usually not known. For women who go on to have a complete miscarriage, the most common cause is an abnormal number of chromosomes in the developing baby. Chromosomes are the structures inside cells that hold all of a person's genetic material. What increases the risk? The following lifestyle factors may increase your risk of a miscarriage in early pregnancy:  Smoking.  Drinking excessive amounts of alcohol or caffeine.  Recreational drug use. The following preexisting health conditions may increase your risk of a miscarriage in early pregnancy:  Polycystic ovary syndrome.  Uterine fibroids.  Infections.  Diabetes mellitus. What are the signs or symptoms? Symptoms of this condition include:  Vaginal bleeding.  Mild abdominal pain or cramps. How is this diagnosed? If you have bleeding with or without abdominal pain before 20 weeks of pregnancy, your health care provider will do tests to check whether you are still pregnant. These will include:  Ultrasound. This test uses sound waves to create images of the inside of your uterus. This allows your health care provider to look at your developing baby and other structures, such as your placenta.  Pelvic exam. This is an internal exam of your vagina and cervix.  Measurement of your baby's heart  rate.  Laboratory tests such as blood tests, urine tests, or swabs for infection You may be diagnosed with a threatened miscarriage if:  Ultrasound testing shows that you are still pregnant.  Your baby's heart rate is strong.  A pelvic exam shows that the opening between your uterus and your vagina (cervix) is closed.  Blood tests confirm that you are still pregnant. How is this treated? No treatments have been shown to prevent a threatened miscarriage from going on to a complete miscarriage. However, the right home care is important. Follow these instructions at home:  Get plenty of rest.  Do not have sex or use tampons if you have vaginal bleeding.  Do not douche.  Do not smoke or use recreational drugs.  Do not drink alcohol.  Avoid caffeine.  Keep all follow-up prenatal visits as told by your health care provider. This is important. Contact a health care provider if:  You have light vaginal bleeding or spotting while pregnant.  You have abdominal pain or cramping.  You have a fever. Get help right away if:  You have heavy vaginal bleeding.  You have blood clots coming from your vagina.  You pass tissue from your vagina.  You leak fluid, or you have a gush of fluid from your vagina.  You have severe low back pain or abdominal cramps.  You have fever, chills, and severe abdominal pain. Summary  A threatened miscarriage occurs when a woman has vaginal bleeding during the first 20 weeks of pregnancy but the pregnancy has not ended.  The cause of a threatened miscarriage is usually not known.  Symptoms of this condition may   include vaginal bleeding and mild abdominal pain or cramps.  No treatments have been shown to prevent a threatened miscarriage from going on to a complete miscarriage.  Keep all follow-up prenatal visits as told by your health care provider. This is important. This information is not intended to replace advice given to you by your health  care provider. Make sure you discuss any questions you have with your health care provider. Document Released: 06/22/2005 Document Revised: 09/18/2016 Document Reviewed: 09/18/2016 Elsevier Interactive Patient Education  2019 [employer]  

## 2018-08-13 NOTE — MAU Provider Note (Signed)
History     CSN: [id number]  Arrival date and time: 08/13/18 1728   First Provider Initiated Contact with Patient 08/13/18 1751      Chief Complaint  Patient presents with  . Vaginal Bleeding   HPI [name] is a 33 y.o. [id number] at 1861w2d who presents to MAU today with complaint of spotting since 2 hours prior to arrival. She denies abdominal pain or fever. She denies recent intercourse or abnormal discharge.   OB History    Gravida  6   Para  3   Term  3   Preterm  0   AB  2   Living  3     SAB  1   TAB  0   Ectopic  1   Multiple  0   Live Births  3           Past Medical History:  Diagnosis Date  . Blood transfusion without reported diagnosis    after ectopic  . Chronic kidney disease   . Ectopic pregnancy, tubal   . Gestational diabetes   . Gestational diabetes mellitus 2011   denies gestational DM with current pregnancy  . Ovarian cyst   . Status post vacuum-assisted vaginal delivery (5/17) 11/20/2015    Past Surgical History:  Procedure Laterality Date  . BILATERAL SALPINGECTOMY Right 02/08/2013   Procedure:  SALPINGECTOMY;  Surgeon: Antionette CharLisa Jackson-Moore, MD;  Location: [facility];  Service: Gynecology;  Laterality: Right;  . CESAREAN SECTION    . CESAREAN SECTION N/A 08/18/2017   Procedure: Repeat CESAREAN SECTION;  Surgeon: Shea EvansMody, Vaishali, MD;  [facility];  Service: Obstetrics;  Laterality: N/A;  EDD: 08/24/17 Allergy: Vicodin  . DILATION AND CURETTAGE OF UTERUS    . LAPAROSCOPY N/A 02/08/2013   Procedure: LAPAROSCOPY OPERATIVE;  Surgeon: Antionette CharLisa Jackson-Moore, MD;  Location: [facility];  Service: Gynecology;  Laterality: N/A;  . SALPINGECTOMY Right 02/2013   op note in epic    History reviewed. No pertinent family history.  Social History   Tobacco Use  . Smoking status: Never Smoker  . Smokeless tobacco: Never Used  Substance Use Topics  . Alcohol use: No    Frequency: Never  . Drug use: No    Allergies:   Allergies  Allergen Reactions  . 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    . Vicodin [Hydrocodone-Acetaminophen] Other (See Comments)    Reaction:  Seizures; has tolerated acetaminophen and other narcotics in the past    Medications Prior to Admission  Medication Sig Dispense Refill Last Dose  . IRON PO Take 1 tablet by mouth daily.   08/13/2018 at Unknown time  . Prenatal Vit-Fe Fumarate-FA (PRENATAL MULTIVITAMIN) TABS tablet Take 1 tablet by mouth daily at 12 noon.   08/13/2018 at Unknown time  . ibuprofen (ADVIL,MOTRIN) 600 MG tablet Take 1 tablet (600 mg total) by mouth every 6 (six) hours. 30 tablet 0 More than a month at Unknown time    Review of Systems  Constitutional: Negative for fever.  Gastrointestinal: Negative for abdominal pain, constipation, diarrhea, nausea and vomiting.  Genitourinary: Positive for vaginal bleeding. Negative for vaginal discharge.   Physical Exam   Blood pressure 129/74, pulse (!) 130, temperature 98.8 F (37.1 C), temperature source Oral, resp. rate 18, weight 89.9 kg, last menstrual period 06/30/2018, SpO2 99 %, unknown if currently breastfeeding.  Physical Exam  Nursing note and vitals reviewed. Constitutional: She is  oriented to person, place, and time. She appears well-developed and well-nourished. No distress.  HENT:  Head: Normocephalic and atraumatic.  Cardiovascular: Regular rhythm and normal heart sounds. Tachycardia present.  No murmur heard. Respiratory: Effort normal and breath sounds normal. No respiratory distress. She has no wheezes.  GI: Soft. She exhibits no distension and no mass. There is no abdominal tenderness. There is no rebound and no guarding.  Genitourinary: Uterus is not enlarged and not tender. Cervix exhibits discharge (small amount of tissue consistent in appearance with POC and clot removed from the cervical os). Cervix exhibits no motion tenderness and no friability. Right  adnexum displays no mass and no tenderness. Left adnexum displays no mass and no tenderness.    Vaginal bleeding (small blood) present.     No vaginal discharge.  There is bleeding (small blood) in the vagina.  Neurological: She is alert and oriented to person, place, and time.  Skin: Skin is warm and dry. No erythema.  Psychiatric: She has a normal mood and affect.  Cervix: closed, thick   Results for orders placed or performed during the hospital encounter of 08/13/18 (from the past 24 hour(s))  Urinalysis, Routine w reflex microscopic     Status: Abnormal   Collection Time: 08/13/18  5:40 PM  Result Value Ref Range   Color, Urine YELLOW YELLOW   APPearance CLEAR CLEAR   Specific Gravity, Urine >1.030 (H) 1.005 - 1.030   pH 6.0 5.0 - 8.0   Glucose, UA NEGATIVE NEGATIVE mg/dL   Hgb urine dipstick LARGE (A) NEGATIVE   Bilirubin Urine SMALL (A) NEGATIVE   Ketones, ur NEGATIVE NEGATIVE mg/dL   Protein, ur 30 (A) NEGATIVE mg/dL   Nitrite NEGATIVE NEGATIVE   Leukocytes, UA NEGATIVE NEGATIVE  Urinalysis, Microscopic (reflex)     Status: None   Collection Time: 08/13/18  5:40 PM  Result Value Ref Range   RBC / HPF >50 0 - 5 RBC/hpf   WBC, UA 0-5 0 - 5 WBC/hpf   Bacteria, UA NONE SEEN NONE SEEN   Squamous Epithelial / LPF 0-5 0 - 5   Mucus PRESENT   Pregnancy, urine POC     Status: Abnormal   Collection Time: 08/13/18  5:42 PM  Result Value Ref Range   Preg Test, Ur POSITIVE (A) NEGATIVE  CBC with Differential/Platelet     Status: Abnormal   Collection Time: 08/13/18  5:58 PM  Result Value Ref Range   WBC 7.2 4.0 - 10.5 K/uL   RBC 3.89 3.87 - 5.11 MIL/uL   Hemoglobin 10.7 (L) 12.0 - 15.0 g/dL   HCT 81.1 (L) 03.1 - 59.4 %   MCV 86.6 80.0 - 100.0 fL   MCH 27.5 26.0 - 34.0 pg   MCHC 31.8 30.0 - 36.0 g/dL   RDW 58.5 92.9 - 24.4 %   Platelets 384 150 - 400 K/uL   nRBC 0.0 0.0 - 0.2 %   Neutrophils Relative % 72 %   Neutro Abs 5.2 1.7 - 7.7 K/uL   Lymphocytes Relative 25 %    Lymphs Abs 1.8 0.7 - 4.0 K/uL   Monocytes Relative 3 %   Monocytes Absolute 0.2 0.1 - 1.0 K/uL   Eosinophils Relative 0 %   Eosinophils Absolute 0.0 0.0 - 0.5 K/uL   Basophils Relative 0 %   Basophils Absolute 0.0 0.0 - 0.1 K/uL  hCG, quantitative, pregnancy     Status: Abnormal   Collection Time: 08/13/18  5:58 PM  Result Value  Ref Range   hCG, Beta Chain, Quant, S 1,593 (H) <5 mIU/mL   US Ob Comp Less 14 Wks  Result Date: 08/13/2018 CLINICAL DATA:  Pregnant patient with vaginal bleeding. EXAM: OBSTETRIC <14 WK [place] AND TRANSVAGINAL OB US TECHNIQUE: Both transabdominal and transvaginal ultrasound examinations were performed for complete evaluation of the gestation as well as the maternal uterus, adnexal regions, and pelvic cul-de-sac. Transvaginal technique was performed to assess early pregnancy. COMPARISON:  None. FINDINGS: Intrauterine gestational sac: None Yolk sac:  Not Visualized. Embryo:  Not Visualized. Cardiac Activity: Not Visualized. Maternal uterus/adnexae: Normal right and left ovaries. Corpus luteum right ovary. Moderate volume free fluid. IMPRESSION: No intrauterine gestation identified. In the setting of positive pregnancy test and no definite intrauterine pregnancy, this reflects a pregnancy of unknown location. Differential considerations include early normal IUP, abnormal IUP, or nonvisualized ectopic pregnancy. Differentiation is achieved with serial beta HCG supplemented by repeat sonography as clinically warranted. Moderate volume free fluid in the pelvis. Electronically Signed   By: Annia Belt M.D.   On: 08/13/2018 18:59   US Ob Transvaginal  Result Date: 08/13/2018 CLINICAL DATA:  Pregnant patient with vaginal bleeding. EXAM: OBSTETRIC <14 WK [place] AND TRANSVAGINAL OB US TECHNIQUE: Both transabdominal and transvaginal ultrasound examinations were performed for complete evaluation of the gestation as well as the maternal uterus, adnexal regions, and pelvic cul-de-sac. Transvaginal  technique was performed to assess early pregnancy. COMPARISON:  None. FINDINGS: Intrauterine gestational sac: None Yolk sac:  Not Visualized. Embryo:  Not Visualized. Cardiac Activity: Not Visualized. Maternal uterus/adnexae: Normal right and left ovaries. Corpus luteum right ovary. Moderate volume free fluid. IMPRESSION: No intrauterine gestation identified. In the setting of positive pregnancy test and no definite intrauterine pregnancy, this reflects a pregnancy of unknown location. Differential considerations include early normal IUP, abnormal IUP, or nonvisualized ectopic pregnancy. Differentiation is achieved with serial beta HCG supplemented by repeat sonography as clinically warranted. Moderate volume free fluid in the pelvis. Electronically Signed   By: Annia Belt M.D.   On: 08/13/2018 18:59    MAU Course  Procedures None  MDM +UPT UA, quant hCG and [place] today to rule out ectopic pregnancy O+ blood type Concern for SAB in progress given appearance of products removed from vaginal vault Sent to pathology Assessment and Plan  A: Pregnancy of unknown location  P: Discharge home Tylenol PRN for pain advised Bleeding/ectopic precautions discussed Patient advised to follow-up with [facility] on Monday for repeat labs  Patient may return to MAU as needed or if her condition were to change or worsen  Vonzella Nipple, PA-C 08/13/2018, 7:33 PM

## 2018-08-13 NOTE — MAU Note (Signed)
Nicole Landry is a 33 y.o.  here in MAU reporting: +vaginal bleeding. Not currently wearing a pad. Noticeable when wipes. LMP: 06/30/18. +HPT Onset of complaint: started 1 hour ago. States started while she was at a wedding and she came right over. Pain score: denies Vitals:   08/13/18 1745  BP: 129/74  Pulse: (!) 130  Resp: 18  Temp: 98.8 F (37.1 C)  SpO2: 99%     Lab orders placed from triage: ua and pregnancy

## 2018-08-15 DIAGNOSIS — O0910 Supervision of pregnancy with history of ectopic or molar pregnancy, unspecified trimester: Secondary | ICD-10-CM | POA: Diagnosis not present

## 2018-08-15 DIAGNOSIS — Z3A01 Less than 8 weeks gestation of pregnancy: Secondary | ICD-10-CM | POA: Diagnosis not present

## 2018-08-17 DIAGNOSIS — Z3201 Encounter for pregnancy test, result positive: Secondary | ICD-10-CM | POA: Diagnosis not present

## 2018-08-30 DIAGNOSIS — Z3201 Encounter for pregnancy test, result positive: Secondary | ICD-10-CM | POA: Diagnosis not present

## 2018-10-19 ENCOUNTER — Emergency Department (HOSPITAL_COMMUNITY)
Admission: EM | Admit: 2018-10-19 | Discharge: 2018-10-20 | Disposition: A | Discharge status: Home / Self Care | Payer: BLUE CROSS/BLUE SHIELD | Attending: Emergency Medicine | Admitting: Emergency Medicine

## 2018-10-19 ENCOUNTER — Encounter (HOSPITAL_COMMUNITY): Payer: Self-pay

## 2018-10-19 ENCOUNTER — Emergency Department (HOSPITAL_COMMUNITY): Payer: BLUE CROSS/BLUE SHIELD

## 2018-10-19 ENCOUNTER — Other Ambulatory Visit: Payer: Self-pay

## 2018-10-19 DIAGNOSIS — Z3A14 14 weeks gestation of pregnancy: Secondary | ICD-10-CM | POA: Insufficient documentation

## 2018-10-19 DIAGNOSIS — Z79899 Other long term (current) drug therapy: Secondary | ICD-10-CM | POA: Insufficient documentation

## 2018-10-19 DIAGNOSIS — R0602 Shortness of breath: Secondary | ICD-10-CM | POA: Diagnosis not present

## 2018-10-19 DIAGNOSIS — R079 Chest pain, unspecified: Secondary | ICD-10-CM | POA: Diagnosis not present

## 2018-10-19 DIAGNOSIS — R0789 Other chest pain: Secondary | ICD-10-CM | POA: Diagnosis not present

## 2018-10-19 DIAGNOSIS — O99512 Diseases of the respiratory system complicating pregnancy, second trimester: Secondary | ICD-10-CM | POA: Insufficient documentation

## 2018-10-19 LAB — CBC
HCT: 33.2 % — ABNORMAL LOW (ref 36.0–46.0)
Hemoglobin: 10.4 g/dL — ABNORMAL LOW (ref 12.0–15.0)
MCH: 27.7 pg (ref 26.0–34.0)
MCHC: 31.3 g/dL (ref 30.0–36.0)
MCV: 88.3 fL (ref 80.0–100.0)
Platelets: 305 10*3/uL (ref 150–400)
RBC: 3.76 MIL/uL — ABNORMAL LOW (ref 3.87–5.11)
RDW: 14.6 % (ref 11.5–15.5)
WBC: 9.5 10*3/uL (ref 4.0–10.5)
nRBC: 0 % (ref 0.0–0.2)

## 2018-10-19 LAB — BASIC METABOLIC PANEL
Anion gap: 12 (ref 5–15)
BUN: 8 mg/dL (ref 6–20)
CO2: 21 mmol/L — ABNORMAL LOW (ref 22–32)
Calcium: 9.2 mg/dL (ref 8.9–10.3)
Chloride: 103 mmol/L (ref 98–111)
Creatinine, Ser: 0.5 mg/dL (ref 0.44–1.00)
GFR calc Af Amer: >60 mL/min (ref >60)
GFR calc non Af Amer: >60 mL/min (ref >60)
Glucose, Bld: 82 mg/dL (ref 70–99)
Potassium: 3.6 mmol/L (ref 3.5–5.1)
Sodium: 136 mmol/L (ref 135–145)

## 2018-10-19 LAB — I-STAT BETA HCG BLOOD, ED (MC, WL, AP ONLY): I-stat hCG, quantitative: >2000 m[IU]/mL — ABNORMAL HIGH (ref <5)

## 2018-10-19 LAB — TROPONIN I: Troponin I: <0.03 ng/mL (ref <0.03)

## 2018-10-19 MED ORDER — ACETAMINOPHEN 325 MG PO TABS
650.0000 mg | ORAL_TABLET | Freq: Once | ORAL | Status: AC | PRN
  Administered 2018-10-19: 650 mg via ORAL
  Filled 2018-10-19: qty 2

## 2018-10-19 MED ORDER — SODIUM CHLORIDE 0.9% FLUSH: 3.0000 mL | Freq: Once | INTRAVENOUS | Status: DC

## 2018-10-19 NOTE — ED Triage Notes (Signed)
Patient is [redacted] wks pregnant who is c/o increased SOB since this morning. Pt also c/o constant pain and tightness in chest that radiates from back, headache, and light headed. Denies nausea, vomiting, sweating, fever, or cough.

## 2018-10-20 ENCOUNTER — Emergency Department (HOSPITAL_COMMUNITY)
Admission: EM | Admit: 2018-10-20 | Discharge: 2018-10-20 | Disposition: A | Discharge status: Home / Self Care | Payer: BLUE CROSS/BLUE SHIELD | Source: Home / Self Care | Attending: Emergency Medicine | Admitting: Emergency Medicine

## 2018-10-20 ENCOUNTER — Emergency Department (HOSPITAL_COMMUNITY): Payer: BLUE CROSS/BLUE SHIELD

## 2018-10-20 ENCOUNTER — Other Ambulatory Visit: Payer: Self-pay

## 2018-10-20 ENCOUNTER — Encounter (HOSPITAL_COMMUNITY): Payer: Self-pay

## 2018-10-20 ENCOUNTER — Telehealth: Payer: Self-pay | Admitting: *Deleted

## 2018-10-20 DIAGNOSIS — R0602 Shortness of breath: Secondary | ICD-10-CM | POA: Diagnosis not present

## 2018-10-20 DIAGNOSIS — N189 Chronic kidney disease, unspecified: Secondary | ICD-10-CM

## 2018-10-20 DIAGNOSIS — Z79899 Other long term (current) drug therapy: Secondary | ICD-10-CM

## 2018-10-20 DIAGNOSIS — R0789 Other chest pain: Secondary | ICD-10-CM | POA: Insufficient documentation

## 2018-10-20 LAB — URINALYSIS, ROUTINE W REFLEX MICROSCOPIC
Bilirubin Urine: NEGATIVE
Glucose, UA: NEGATIVE mg/dL
Hgb urine dipstick: NEGATIVE
Ketones, ur: NEGATIVE mg/dL
Nitrite: NEGATIVE
Protein, ur: 30 mg/dL — AB
Specific Gravity, Urine: 1.023 (ref 1.005–1.030)
WBC, UA: >50 WBC/hpf — ABNORMAL HIGH (ref 0–5)
pH: 6 (ref 5.0–8.0)

## 2018-10-20 LAB — D-DIMER, QUANTITATIVE: D-Dimer, Quant: 0.67 ug/mL-FEU — ABNORMAL HIGH (ref 0.00–0.50)

## 2018-10-20 MED ORDER — SODIUM CHLORIDE 0.9 % IV BOLUS
500.0000 mL | Freq: Once | INTRAVENOUS | Status: AC
  Administered 2018-10-20: 500 mL via INTRAVENOUS

## 2018-10-20 MED ORDER — ACETAMINOPHEN 500 MG PO TABS
1000.0000 mg | ORAL_TABLET | Freq: Once | ORAL | Status: AC
  Administered 2018-10-20: 1000 mg via ORAL
  Filled 2018-10-20: qty 2

## 2018-10-20 MED ORDER — TECHNETIUM TO 99M ALBUMIN AGGREGATED
1.4900 | Freq: Once | INTRAVENOUS | Status: AC | PRN
  Administered 2018-10-20: 1.49 via INTRAVENOUS

## 2018-10-20 NOTE — ED Notes (Signed)
Patient to VQ scan.

## 2018-10-20 NOTE — ED Notes (Signed)
Patient given crackers

## 2018-10-20 NOTE — Telephone Encounter (Signed)
Left message for her to call me back.  The voicemail did not state he name, so I did not leave the details. Will give results of elevated d-dimer testing and encourage further workup.

## 2018-10-20 NOTE — Discharge Instructions (Addendum)
Your VQ scan did not show any evidence of abnormality or pulmonary embolism.  As discussed, your evaluation today has been largely reassuring.  But, it is important that you monitor your condition carefully, and do not hesitate to return to the ED if you develop new, or concerning changes in your condition.  Otherwise, please follow-up with your physician for appropriate ongoing care.

## 2018-10-20 NOTE — ED Notes (Signed)
The pt reports that she has to, leave because her baby sitter  Has to go to work

## 2018-10-20 NOTE — ED Provider Notes (Signed)
[facility] EMERGENCY DEPARTMENT Provider Note   CSN: [id number] Arrival date & time: 10/20/18  1215    History   Chief Complaint Chief Complaint  Patient presents with  . Abnormal Lab    HPI Nicole Landry is a 33 y.o. female.     HPI  Patient presents with concern of chest pain, dyspnea. Patient was here overnight, about 12 hours ago, had evaluation including x-ray, labs. Labs were generally reassuring, your d-dimer was delayed. D-dimer arrived after patient left to do with her babysitter, was elevated and she was encouraged to return here. She continues to have similar chest pain, dyspnea, mild lightheadedness, but no abdominal pain, no vaginal bleeding, discharge. She notes a history of blood clot, reportedly near her uterus earlier in this pregnancy.  Past Medical History:  Diagnosis Date  . Blood transfusion without reported diagnosis    after ectopic  . Chronic kidney disease   . Ectopic pregnancy, tubal   . Gestational diabetes   . Gestational diabetes mellitus 2011   denies gestational DM with current pregnancy  . Ovarian cyst   . Status post vacuum-assisted vaginal delivery (5/17) 11/20/2015    Patient Active Problem List   Diagnosis Date Noted  . Postpartum care following cesarean delivery (2/13) 08/19/2017  . Postoperative anemia due to acute blood loss 08/19/2017  . S/P repeat low transverse C-section 08/18/2017    Past Surgical History:  Procedure Laterality Date  . BILATERAL SALPINGECTOMY Right 02/08/2013   Procedure:  SALPINGECTOMY;  Surgeon: Antionette Char, MD;  Location: [facility];  Service: Gynecology;  Laterality: Right;  . CESAREAN SECTION    . CESAREAN SECTION N/A 08/18/2017   Procedure: Repeat CESAREAN SECTION;  Surgeon: Shea Evans, MD;  Location: [facility] SU[facility];  Service: Obstetrics;  Laterality: N/A;  EDD: 08/24/17 Allergy: Vicodin  . DILATION AND CURETTAGE OF UTERUS    . LAPAROSCOPY N/A 02/08/2013   Procedure: LAPAROSCOPY OPERATIVE;  Surgeon: Antionette Char, MD;  Location: [facility];  Service: Gynecology;  Laterality: N/A;  . SALPINGECTOMY Right 02/2013   op note in epic     OB History    Gravida  6   Para  3   Term  3   Preterm  0   AB  2   Living  3     SAB  1   TAB  0   Ectopic  1   Multiple  0   Live Births  3            Home Medications    Prior to Admission medications   Medication Sig Start Date End Date Taking? Authorizing Provider  IRON PO Take 1 tablet by mouth daily.    [provider]  Prenatal Vit-Fe Fumarate-FA (PRENATAL MULTIVITAMIN) TABS tablet Take 1 tablet by mouth daily at 12 noon.    [provider]    Family History History reviewed. No pertinent family history.  Social History Social History   Tobacco Use  . Smoking status: Never Smoker  . Smokeless tobacco: Never Used  Substance Use Topics  . Alcohol use: No    Frequency: Never  . Drug use: No     Allergies   Food and Vicodin [hydrocodone-acetaminophen]   Review of Systems Review of Systems  Constitutional:       Per HPI, otherwise negative  HENT:       Per HPI, otherwise negative  Respiratory:       Per HPI, otherwise negative  Cardiovascular:       Per HPI, otherwise negative  Gastrointestinal: Negative for vomiting.  Endocrine:       Negative aside from HPI  Genitourinary:       Neg aside from HPI   Musculoskeletal:       Per HPI, otherwise negative  Skin: Negative.   Neurological: Negative for syncope.     Physical Exam Updated Vital Signs BP 118/75 (BP Location: Left Arm)   Pulse (!) 102   Temp 98.2 F (36.8 C) (Oral)   Resp 16   Ht 5\' 3"  (1.6 m)   Wt 90.7 kg   LMP 06/30/2018   SpO2 99%   BMI 35.43 kg/m   Physical Exam Vitals signs and nursing note reviewed.  Constitutional:      General: She is not in acute distress.    Appearance: She is well-developed.  HENT:     Head: Normocephalic and atraumatic.  Eyes:      Conjunctiva/sclera: Conjunctivae normal.  Cardiovascular:     Rate and Rhythm: Normal rate and regular rhythm.  Pulmonary:     Effort: Pulmonary effort is normal. No respiratory distress.     Breath sounds: Normal breath sounds. No stridor.  Abdominal:     General: There is no distension.     Tenderness: There is no abdominal tenderness.  Skin:    General: Skin is warm and dry.  Neurological:     Mental Status: She is alert and oriented to person, place, and time.     Cranial Nerves: No cranial nerve deficit.      ED Treatments / Results  Labs (all labs ordered are listed, but only abnormal results are displayed) Labs Reviewed - No data to display   Radiology Dg Chest 2 View  Result Date: 10/19/2018 CLINICAL DATA:  Chest pain EXAM: CHEST - 2 VIEW COMPARISON:  03/13/2009 FINDINGS: The heart size and mediastinal contours are within normal limits. Both lungs are clear. The visualized skeletal structures are unremarkable. IMPRESSION: No active cardiopulmonary disease. Electronically Signed   By: Deatra Robinson M.D.   On: 10/19/2018 21:31    Procedures Procedures (including critical care time)  Medications Ordered in ED Medications - No data to display   Initial Impression / Assessment and Plan / ED Course  I have reviewed the triage vital signs and the nursing notes.  Pertinent labs & imaging results that were available during my care of the patient were reviewed by me and considered in my medical decision making (see chart for details).    After the initial evaluation I reviewed the patient's chart including documentation from eval last night, including positive d-dimer, unremarkable labs otherwise, EKG which was generally reassuring. Subsequently discussed case with her obstetrics team given her report of blood clot, likely subchorionic hemorrhage. Patient has had ultrasound will recently been our most recent visible results, with demonstrated IUP, with viable pregnancy in  their facility. Reportedly the patient has also not had follow-up, missing appointments, but is scheduled for 1 in 3 days. With concern for chest pain, dyspnea, elevated dimer, the patient will have VQ scan to exclude pulmonary embolism. Thus far, studies reassuring from last night, fetal heart tones appropriate, and absent abdominal pain, vaginal complaints, low suspicion for preterm labor or other acute new findings. Chest pain, dyspnea may be secondary to pregnancy status, and if VQ scan results are reassuring, patient will follow-up with her obstetrics team.    Dr. Rush Landmark is aware of the patient.  Final  Clinical Impressions(s) / ED Diagnoses  Atypical chest pain   Gerhard MunchLockwood, Oni Dietzman, MD 10/20/18 1621

## 2018-10-20 NOTE — ED Provider Notes (Signed)
4:49 PM Care assumed from Dr. Jeraldine Loots.  At time of transfer care, patient is awaiting results of VQ scan to look for pulmonary ballismus the setting of pregnancy and elevated d-dimer on previous lab work.  Plan of care is to discharge patient if VQ scan is either negative or inconclusive.   5:50 PM VQ scan was negative with no evidence of pulmonary embolism.  Per plan of care, patient will be discharged with plans to follow-up with her PCP.  Patient is feeling better.  Patient discharged in good condition.   Clinical Impression: 1. Atypical chest pain     Disposition: Discharge  Condition: Good  I have discussed the results, Dx and Tx plan with the pt(& family if present). He/she/they expressed understanding and agree(s) with the plan. Discharge instructions discussed at great length. Strict return precautions discussed and pt &/or family have verbalized understanding of the instructions. No further questions at time of discharge.    New Prescriptions   No medications on file    Follow Up: Shea Evans, MD [address] [place] [address] [phone]   as scheduled  [facility] EMERGENCY DEPARTMENT [address] [id number] mc [place] [address] [phone]          Murl Zogg, Canary Brim, MD 10/20/18 1755

## 2018-10-20 NOTE — ED Notes (Signed)
The pt woke up with mud chest pain for several hours   With some sob   Pain worse with breathing alert oriented

## 2018-10-20 NOTE — ED Provider Notes (Signed)
[facility] EMERGENCY DEPARTMENT Provider Note   CSN: [id number] Arrival date & time: 10/19/18  2049    History   Chief Complaint Chief Complaint  Patient presents with  . Shortness of Breath    HPI Nicole Landry is a 33 y.o. female.     Patient presents to the emergency department with a chief complaint of shortness of breath.  She states that the room started this morning around 4 AM.  She states that she now has some chest tightness in the front of her chest.  Reports that her shortness of breath worsens with speaking.  She also reports feeling lightheaded.  Denies any abdominal pain, nausea, vomiting, fever, chills, cough.  She is approximately [redacted] weeks pregnant.  She denies any calf swelling or leg pain.  She states that she drove to [place] about 1 month ago.  Denies any history of PE or DVT.  The history is provided by the patient. No language interpreter was used.    Past Medical History:  Diagnosis Date  . Blood transfusion without reported diagnosis    after ectopic  . Chronic kidney disease   . Ectopic pregnancy, tubal   . Gestational diabetes   . Gestational diabetes mellitus 2011   denies gestational DM with current pregnancy  . Ovarian cyst   . Status post vacuum-assisted vaginal delivery (5/17) 11/20/2015    Patient Active Problem List   Diagnosis Date Noted  . Postpartum care following cesarean delivery (2/13) 08/19/2017  . Postoperative anemia due to acute blood loss 08/19/2017  . S/P repeat low transverse C-section 08/18/2017    Past Surgical History:  Procedure Laterality Date  . BILATERAL SALPINGECTOMY Right 02/08/2013   Procedure:  SALPINGECTOMY;  Surgeon: Antionette Char, MD;  Location: [facility];  Service: Gynecology;  Laterality: Right;  . CESAREAN SECTION    . CESAREAN SECTION N/A 08/18/2017   Procedure: Repeat CESAREAN SECTION;  Surgeon: Shea Evans, MD;  Location: [facility] BIRTHING SUITES;  Service: Obstetrics;   Laterality: N/A;  EDD: 08/24/17 Allergy: Vicodin  . DILATION AND CURETTAGE OF UTERUS    . LAPAROSCOPY N/A 02/08/2013   Procedure: LAPAROSCOPY OPERATIVE;  Surgeon: Antionette Char, MD;  Location: [facility] ORS;  Service: Gynecology;  Laterality: N/A;  . SALPINGECTOMY Right 02/2013   op note in epic     OB History    Gravida  6   Para  3   Term  3   Preterm  0   AB  2   Living  3     SAB  1   TAB  0   Ectopic  1   Multiple  0   Live Births  3            Home Medications    Prior to Admission medications   Medication Sig Start Date End Date Taking? Authorizing Provider  IRON PO Take 1 tablet by mouth daily.    [provider]  Prenatal Vit-Fe Fumarate-FA (PRENATAL MULTIVITAMIN) TABS tablet Take 1 tablet by mouth daily at 12 noon.    [provider]    Family History No family history on file.  Social History Social History   Tobacco Use  . Smoking status: Never Smoker  . Smokeless tobacco: Never Used  Substance Use Topics  . Alcohol use: No    Frequency: Never  . Drug use: No     Allergies   Food and Vicodin [hydrocodone-acetaminophen]   Review of Systems Review of Systems  All other systems reviewed and are negative.    Physical Exam Updated Vital Signs BP 120/78 (BP Location: Right Arm)   Pulse (!) 106   Temp 98.2 F (36.8 C) (Oral)   Resp 18   LMP 06/30/2018   SpO2 100%   Physical Exam Vitals signs and nursing note reviewed.  Constitutional:      Appearance: She is well-developed.  HENT:     Head: Normocephalic and atraumatic.  Eyes:     Conjunctiva/sclera: Conjunctivae normal.     Pupils: Pupils are equal, round, and reactive to light.  Neck:     Musculoskeletal: Normal range of motion and neck supple.  Cardiovascular:     Rate and Rhythm: Normal rate and regular rhythm.     Heart sounds: No murmur. No friction rub. No gallop.   Pulmonary:     Effort: Pulmonary effort is normal. No respiratory distress.      Breath sounds: Normal breath sounds. No wheezing or rales.  Chest:     Chest wall: No tenderness.  Abdominal:     General: Bowel sounds are normal. There is no distension.     Palpations: Abdomen is soft. There is no mass.     Tenderness: There is no abdominal tenderness. There is no guarding or rebound.  Musculoskeletal: Normal range of motion.        General: No tenderness.  Skin:    General: Skin is warm and dry.  Neurological:     Mental Status: She is alert and oriented to person, place, and time.  Psychiatric:        Behavior: Behavior normal.        Thought Content: Thought content normal.        Judgment: Judgment normal.      ED Treatments / Results  Labs (all labs ordered are listed, but only abnormal results are displayed) Labs Reviewed  BASIC METABOLIC PANEL - Abnormal; Notable for the following components:      Result Value   CO2 21 (*)    All other components within normal limits  CBC - Abnormal; Notable for the following components:   RBC 3.76 (*)    Hemoglobin 10.4 (*)    HCT 33.2 (*)    All other components within normal limits  I-STAT BETA HCG BLOOD, ED ([facility], [facility], [facility] ONLY) - Abnormal; Notable for the following components:   I-stat hCG, quantitative >2,000.0 (*)    All other components within normal limits  TROPONIN I    EKG None  Radiology Dg Chest 2 View  Result Date: 10/19/2018 CLINICAL DATA:  Chest pain EXAM: CHEST - 2 VIEW COMPARISON:  03/13/2009 FINDINGS: The heart size and mediastinal contours are within normal limits. Both lungs are clear. The visualized skeletal structures are unremarkable. IMPRESSION: No active cardiopulmonary disease. Electronically Signed   By: Deatra Robinson M.D.   On: 10/19/2018 21:31    Procedures Procedures (including critical care time)  Medications Ordered in ED Medications  sodium chloride flush (NS) 0.9 % injection 3 mL (has no administration in time range)  acetaminophen (TYLENOL) tablet 650 mg (650 mg Oral  Given 10/19/18 2205)     Initial Impression / Assessment and Plan / ED Course  I have reviewed the triage vital signs and the nursing notes.  Pertinent labs & imaging results that were available during my care of the patient were reviewed by me and considered in my medical decision making (see chart for details).  Patient with shortness of breath and chest pain.  She is [redacted] weeks pregnant.  She denies any history of PE or DVT.  She states that she did have travel to [place] about a month or so ago.  Her laboratory work-up thus far is reassuring.  Vital signs are stable.  She was mildly tachycardic in triage and during her EKG, but during my history her heart rate was in the high 80s to low 90s.  Her O2 saturation has been 99 to 100%.  I have a low suspicion for PE, but cannot definitively rule this out, and cannot use PERC criteria.  Will check d-dimer.  Patient states that she needs to be discharged so that she can get home to care for her children.  I will continue to monitor the d-dimer test, which is still pending, and will call the patient if the test results abnormal.  Shared decision making was used, and the patient understands that if the test is positive, that she could have a embolism, which can be life-threatening.  3:45 AM D-dimer is 0.67.  I called the patient, but she did not answer, I left a message and advised her to return to the emergency department to complete her work-up.  I have also sent a message to case management to try contacting the patient again during normal daytime hours to ensure that she knows that the test is abnormal and that she will need to return to complete her work-up.  I do believe that the patient's likelihood of having a PE is low.  Though pregnancy adjusted D-dimer is not routinely used yet, there is some literature that would suggest that d-dimer cutoffs can be 0.75/1.00/1.25 for 1st/2nd/3rd trimesters respectively.  Given that she is 0.67, I  believe her to be low risk. Yehuda Budd(Jeffrey A. Kline, Adria Devonhristopher Kabrhel Emergency Evaluation for Pulmonary Embolism, Part 1: Clinical Factors that Increase Risk The Journal of Emergency Medicine, Volume 48, Issue 6, June 2015, Pages 771-780)    Final Clinical Impressions(s) / ED Diagnoses   Final diagnoses:  Shortness of breath    ED Discharge Orders    None       Roxy HorsemanBrowning, Gracin Soohoo, PA-C 10/20/18 [id number]    Glynn Octaveancour, Stephen, MD 10/20/18 2014

## 2018-10-20 NOTE — ED Triage Notes (Signed)
Pt was seen here yesterday for pain and SOB. Pt was called this morning and told her d-dimer was elevated. Was told to return here to check on baby. Pt is [redacted] weeks pregnant.

## 2018-10-20 NOTE — Telephone Encounter (Signed)
Pt returned call.  EDCM gave pt results of d-dimer.  Pt states she will follow up with OB (Mody) to ensure baby is ok.

## 2018-10-20 NOTE — ED Notes (Signed)
ED Provider at bedside. 

## 2018-10-20 NOTE — ED Notes (Signed)
Ambulatory to BR with steady gait

## 2018-10-21 ENCOUNTER — Encounter (HOSPITAL_COMMUNITY): Payer: Self-pay

## 2018-10-27 DIAGNOSIS — Z118 Encounter for screening for other infectious and parasitic diseases: Secondary | ICD-10-CM | POA: Diagnosis not present

## 2018-10-27 DIAGNOSIS — Z3A15 15 weeks gestation of pregnancy: Secondary | ICD-10-CM | POA: Diagnosis not present

## 2018-10-27 DIAGNOSIS — Z3689 Encounter for other specified antenatal screening: Secondary | ICD-10-CM | POA: Diagnosis not present

## 2018-10-27 DIAGNOSIS — Z8632 Personal history of gestational diabetes: Secondary | ICD-10-CM | POA: Diagnosis not present

## 2018-10-27 DIAGNOSIS — O0912 Supervision of pregnancy with history of ectopic or molar pregnancy, second trimester: Secondary | ICD-10-CM | POA: Diagnosis not present

## 2018-10-27 DIAGNOSIS — Z361 Encounter for antenatal screening for raised alphafetoprotein level: Secondary | ICD-10-CM | POA: Diagnosis not present

## 2018-10-27 DIAGNOSIS — Z113 Encounter for screening for infections with a predominantly sexual mode of transmission: Secondary | ICD-10-CM | POA: Diagnosis not present

## 2018-10-27 LAB — OB RESULTS CONSOLE RUBELLA ANTIBODY, IGM: Rubella: IMMUNE

## 2018-10-27 LAB — OB RESULTS CONSOLE HIV ANTIBODY (ROUTINE TESTING): HIV: NONREACTIVE

## 2018-10-27 LAB — OB RESULTS CONSOLE GC/CHLAMYDIA
Chlamydia: NEGATIVE
Gonorrhea: NEGATIVE

## 2018-10-27 LAB — OB RESULTS CONSOLE ABO/RH: RH Type: POSITIVE

## 2018-10-27 LAB — OB RESULTS CONSOLE HEPATITIS B SURFACE ANTIGEN: Hepatitis B Surface Ag: NEGATIVE

## 2018-10-27 LAB — OB RESULTS CONSOLE ANTIBODY SCREEN: Antibody Screen: NEGATIVE

## 2018-10-27 LAB — OB RESULTS CONSOLE RPR: RPR: NONREACTIVE

## 2018-11-22 DIAGNOSIS — Z363 Encounter for antenatal screening for malformations: Secondary | ICD-10-CM | POA: Diagnosis not present

## 2018-11-22 DIAGNOSIS — Z8632 Personal history of gestational diabetes: Secondary | ICD-10-CM | POA: Diagnosis not present

## 2018-11-22 DIAGNOSIS — Z3682 Encounter for antenatal screening for nuchal translucency: Secondary | ICD-10-CM | POA: Diagnosis not present

## 2018-12-19 DIAGNOSIS — O99212 Obesity complicating pregnancy, second trimester: Secondary | ICD-10-CM | POA: Diagnosis not present

## 2018-12-19 DIAGNOSIS — O3662X0 Maternal care for excessive fetal growth, second trimester, not applicable or unspecified: Secondary | ICD-10-CM | POA: Diagnosis not present

## 2018-12-19 DIAGNOSIS — Z3A23 23 weeks gestation of pregnancy: Secondary | ICD-10-CM | POA: Diagnosis not present

## 2019-01-20 DIAGNOSIS — O99212 Obesity complicating pregnancy, second trimester: Secondary | ICD-10-CM | POA: Diagnosis not present

## 2019-01-20 DIAGNOSIS — Z8759 Personal history of other complications of pregnancy, childbirth and the puerperium: Secondary | ICD-10-CM | POA: Diagnosis not present

## 2019-01-20 DIAGNOSIS — Z3A27 27 weeks gestation of pregnancy: Secondary | ICD-10-CM | POA: Diagnosis not present

## 2019-01-30 DIAGNOSIS — Z23 Encounter for immunization: Secondary | ICD-10-CM | POA: Diagnosis not present

## 2019-01-30 DIAGNOSIS — Z3689 Encounter for other specified antenatal screening: Secondary | ICD-10-CM | POA: Diagnosis not present

## 2019-01-30 DIAGNOSIS — O3663X0 Maternal care for excessive fetal growth, third trimester, not applicable or unspecified: Secondary | ICD-10-CM | POA: Diagnosis not present

## 2019-01-30 DIAGNOSIS — O2603 Excessive weight gain in pregnancy, third trimester: Secondary | ICD-10-CM | POA: Diagnosis not present

## 2019-01-30 DIAGNOSIS — Z3A29 29 weeks gestation of pregnancy: Secondary | ICD-10-CM | POA: Diagnosis not present

## 2019-02-22 DIAGNOSIS — O24410 Gestational diabetes mellitus in pregnancy, diet controlled: Secondary | ICD-10-CM | POA: Diagnosis not present

## 2019-02-22 DIAGNOSIS — O99213 Obesity complicating pregnancy, third trimester: Secondary | ICD-10-CM | POA: Diagnosis not present

## 2019-02-22 DIAGNOSIS — Z3A32 32 weeks gestation of pregnancy: Secondary | ICD-10-CM | POA: Diagnosis not present

## 2019-03-06 DIAGNOSIS — Z3A34 34 weeks gestation of pregnancy: Secondary | ICD-10-CM | POA: Diagnosis not present

## 2019-03-06 DIAGNOSIS — O3663X0 Maternal care for excessive fetal growth, third trimester, not applicable or unspecified: Secondary | ICD-10-CM | POA: Diagnosis not present

## 2019-03-06 DIAGNOSIS — O09293 Supervision of pregnancy with other poor reproductive or obstetric history, third trimester: Secondary | ICD-10-CM | POA: Diagnosis not present

## 2019-03-20 DIAGNOSIS — O24410 Gestational diabetes mellitus in pregnancy, diet controlled: Secondary | ICD-10-CM | POA: Diagnosis not present

## 2019-03-20 DIAGNOSIS — Z3A36 36 weeks gestation of pregnancy: Secondary | ICD-10-CM | POA: Diagnosis not present

## 2019-03-20 DIAGNOSIS — Z3685 Encounter for antenatal screening for Streptococcus B: Secondary | ICD-10-CM | POA: Diagnosis not present

## 2019-03-20 DIAGNOSIS — Z8632 Personal history of gestational diabetes: Secondary | ICD-10-CM | POA: Diagnosis not present

## 2019-03-24 ENCOUNTER — Other Ambulatory Visit: Payer: Self-pay | Admitting: Obstetrics & Gynecology

## 2019-03-28 ENCOUNTER — Encounter (HOSPITAL_COMMUNITY): Payer: Self-pay

## 2019-03-28 NOTE — Patient Instructions (Signed)
NOVIE MAGGIO  03/28/2019   Your procedure is scheduled on:  04/12/2019  Arrive at 1:00pm at Entrance C on [address] at [facility]  and [address]. You are invited to use the FREE valet parking or use the Visitor's parking deck.  Pick up the phone at the desk and dial [phone].  Call this number if you have problems the morning of surgery: [phone]  Remember:   Do not eat food:(After Midnight) Desps de medianoche.  Do not drink clear liquids: (After Midnight) Desps de medianoche.  Take these medicines the morning of surgery with A SIP OF WATER:  none   Do not wear jewelry, make-up or nail polish.  Do not wear lotions, powders, or perfumes. Do not wear deodorant.  Do not shave 48 hours prior to surgery.  Do not bring valuables to the hospital.  [facility] is not   responsible for any belongings or valuables brought to the hospital.  Contacts, dentures or bridgework may not be worn into surgery.  Leave suitcase in the car. After surgery it may be brought to your room.  For patients admitted to the hospital, checkout time is 11:00 AM the day of              discharge.      Please read over the following fact sheets that you were given:     Preparing for Surgery

## 2019-04-04 DIAGNOSIS — O24410 Gestational diabetes mellitus in pregnancy, diet controlled: Secondary | ICD-10-CM | POA: Diagnosis not present

## 2019-04-04 DIAGNOSIS — Z3A38 38 weeks gestation of pregnancy: Secondary | ICD-10-CM | POA: Diagnosis not present

## 2019-04-06 ENCOUNTER — Inpatient Hospital Stay (HOSPITAL_COMMUNITY): Admit: 2019-04-06 | Payer: BC Managed Care – PPO

## 2019-04-10 ENCOUNTER — Other Ambulatory Visit: Payer: Self-pay

## 2019-04-10 ENCOUNTER — Other Ambulatory Visit (HOSPITAL_COMMUNITY)
Admission: RE | Admit: 2019-04-10 | Discharge: 2019-04-10 | Disposition: A | Discharge status: Home / Self Care | Payer: BC Managed Care – PPO | Source: Ambulatory Visit | Attending: Obstetrics & Gynecology | Admitting: Obstetrics & Gynecology

## 2019-04-10 DIAGNOSIS — Z3A00 Weeks of gestation of pregnancy not specified: Secondary | ICD-10-CM | POA: Diagnosis not present

## 2019-04-10 DIAGNOSIS — Z20828 Contact with and (suspected) exposure to other viral communicable diseases: Secondary | ICD-10-CM | POA: Diagnosis not present

## 2019-04-10 DIAGNOSIS — Z01818 Encounter for other preprocedural examination: Secondary | ICD-10-CM | POA: Diagnosis not present

## 2019-04-10 LAB — ABO/RH: ABO/RH(D): O POS

## 2019-04-10 LAB — CBC
HCT: 36.4 % (ref 36.0–46.0)
Hemoglobin: 11.2 g/dL — ABNORMAL LOW (ref 12.0–15.0)
MCH: 27.1 pg (ref 26.0–34.0)
MCHC: 30.8 g/dL (ref 30.0–36.0)
MCV: 87.9 fL (ref 80.0–100.0)
Platelets: 241 10*3/uL (ref 150–400)
RBC: 4.14 MIL/uL (ref 3.87–5.11)
RDW: 15.6 % — ABNORMAL HIGH (ref 11.5–15.5)
WBC: 6 10*3/uL (ref 4.0–10.5)
nRBC: 0 % (ref 0.0–0.2)

## 2019-04-10 LAB — TYPE AND SCREEN
ABO/RH(D): O POS
Antibody Screen: NEGATIVE

## 2019-04-10 LAB — SARS CORONAVIRUS 2 BY RT PCR (HOSPITAL ORDER, PERFORMED IN ~~LOC~~ HOSPITAL LAB): SARS Coronavirus 2: NEGATIVE

## 2019-04-10 NOTE — MAU Note (Signed)
Covid swab collected. Pt tolerated well. Asymptomatic. Sent to lobby to wait on labs to be drawn 

## 2019-04-11 LAB — RPR: RPR Ser Ql: NONREACTIVE

## 2019-04-12 ENCOUNTER — Encounter (HOSPITAL_COMMUNITY): Payer: Self-pay | Admitting: *Deleted

## 2019-04-12 ENCOUNTER — Inpatient Hospital Stay (HOSPITAL_COMMUNITY): Payer: BC Managed Care – PPO | Admitting: Anesthesiology

## 2019-04-12 ENCOUNTER — Inpatient Hospital Stay (HOSPITAL_COMMUNITY)
Admission: AD | Admit: 2019-04-12 | Discharge: 2019-04-15 | DRG: 787 | Disposition: A | Discharge status: Home / Self Care | Payer: BC Managed Care – PPO | Attending: Obstetrics & Gynecology | Admitting: Obstetrics & Gynecology

## 2019-04-12 ENCOUNTER — Other Ambulatory Visit: Payer: Self-pay

## 2019-04-12 ENCOUNTER — Encounter (HOSPITAL_COMMUNITY)
Admission: AD | Disposition: A | Discharge status: Home / Self Care | Payer: Self-pay | Source: Home / Self Care | Attending: Obstetrics & Gynecology

## 2019-04-12 DIAGNOSIS — O99214 Obesity complicating childbirth: Secondary | ICD-10-CM | POA: Diagnosis not present

## 2019-04-12 DIAGNOSIS — Z98891 History of uterine scar from previous surgery: Secondary | ICD-10-CM

## 2019-04-12 DIAGNOSIS — D62 Acute posthemorrhagic anemia: Secondary | ICD-10-CM | POA: Diagnosis not present

## 2019-04-12 DIAGNOSIS — Z3A39 39 weeks gestation of pregnancy: Secondary | ICD-10-CM

## 2019-04-12 DIAGNOSIS — O34211 Maternal care for low transverse scar from previous cesarean delivery: Principal | ICD-10-CM | POA: Diagnosis present

## 2019-04-12 DIAGNOSIS — O9081 Anemia of the puerperium: Secondary | ICD-10-CM | POA: Diagnosis not present

## 2019-04-12 DIAGNOSIS — O99824 Streptococcus B carrier state complicating childbirth: Secondary | ICD-10-CM | POA: Diagnosis present

## 2019-04-12 DIAGNOSIS — O24420 Gestational diabetes mellitus in childbirth, diet controlled: Secondary | ICD-10-CM | POA: Diagnosis not present

## 2019-04-12 DIAGNOSIS — Z3A00 Weeks of gestation of pregnancy not specified: Secondary | ICD-10-CM | POA: Diagnosis not present

## 2019-04-12 LAB — CBC
HCT: 35.5 % — ABNORMAL LOW (ref 36.0–46.0)
Hemoglobin: 10.9 g/dL — ABNORMAL LOW (ref 12.0–15.0)
MCH: 27.2 pg (ref 26.0–34.0)
MCHC: 30.7 g/dL (ref 30.0–36.0)
MCV: 88.5 fL (ref 80.0–100.0)
Platelets: 246 10*3/uL (ref 150–400)
RBC: 4.01 MIL/uL (ref 3.87–5.11)
RDW: 15.7 % — ABNORMAL HIGH (ref 11.5–15.5)
WBC: 13.2 10*3/uL — ABNORMAL HIGH (ref 4.0–10.5)
nRBC: 0 % (ref 0.0–0.2)

## 2019-04-12 LAB — GLUCOSE, CAPILLARY
Glucose-Capillary: 74 mg/dL (ref 70–99)
Glucose-Capillary: 79 mg/dL (ref 70–99)

## 2019-04-12 LAB — CREATININE, SERUM
Creatinine, Ser: 0.56 mg/dL (ref 0.44–1.00)
GFR calc Af Amer: >60 mL/min (ref >60)
GFR calc non Af Amer: >60 mL/min (ref >60)

## 2019-04-12 SURGERY — Surgical Case
Anesthesia: Spinal

## 2019-04-12 MED ORDER — ACETAMINOPHEN 325 MG PO TABS
650.0000 mg | ORAL_TABLET | ORAL | Status: DC | PRN
  Administered 2019-04-13: 650 mg via ORAL
  Filled 2019-04-12: qty 2

## 2019-04-12 MED ORDER — OXYTOCIN 40 UNITS IN NORMAL SALINE INFUSION - SIMPLE MED: 2.5000 [IU]/h | INTRAVENOUS | Status: AC

## 2019-04-12 MED ORDER — TETANUS-DIPHTH-ACELL PERTUSSIS 5-2.5-18.5 LF-MCG/0.5 IM SUSP: 0.5000 mL | Freq: Once | INTRAMUSCULAR | Status: DC

## 2019-04-12 MED ORDER — ONDANSETRON HCL 4 MG/2ML IJ SOLN: 4.0000 mg | Freq: Three times a day (TID) | INTRAMUSCULAR | Status: DC | PRN

## 2019-04-12 MED ORDER — KETOROLAC TROMETHAMINE 30 MG/ML IJ SOLN
30.0000 mg | Freq: Four times a day (QID) | INTRAMUSCULAR | Status: AC
  Administered 2019-04-12 – 2019-04-13 (×3): 30 mg via INTRAVENOUS
  Filled 2019-04-12 (×3): qty 1

## 2019-04-12 MED ORDER — SODIUM CHLORIDE 0.9 % IR SOLN
Status: DC | PRN
  Administered 2019-04-12: 1

## 2019-04-12 MED ORDER — FENTANYL CITRATE (PF) 100 MCG/2ML IJ SOLN: 25.0000 ug | INTRAMUSCULAR | Status: DC | PRN

## 2019-04-12 MED ORDER — FENTANYL CITRATE (PF) 100 MCG/2ML IJ SOLN
INTRAMUSCULAR | Status: DC | PRN
  Administered 2019-04-12: 85 ug via INTRAVENOUS
  Administered 2019-04-12: 15 ug via INTRATHECAL

## 2019-04-12 MED ORDER — FENTANYL CITRATE (PF) 100 MCG/2ML IJ SOLN
INTRAMUSCULAR | Status: AC
  Filled 2019-04-12: qty 2

## 2019-04-12 MED ORDER — DIPHENHYDRAMINE HCL 25 MG PO CAPS
25.0000 mg | ORAL_CAPSULE | Freq: Four times a day (QID) | ORAL | Status: DC | PRN
  Administered 2019-04-13: 25 mg via ORAL
  Filled 2019-04-12: qty 1

## 2019-04-12 MED ORDER — DIPHENHYDRAMINE HCL 50 MG/ML IJ SOLN
12.5000 mg | Freq: Once | INTRAMUSCULAR | Status: AC
  Administered 2019-04-12: 18:00:00 12.5 mg via INTRAVENOUS

## 2019-04-12 MED ORDER — NALOXONE HCL 4 MG/10ML IJ SOLN
1.0000 ug/kg/h | INTRAVENOUS | Status: DC | PRN
  Filled 2019-04-12: qty 5

## 2019-04-12 MED ORDER — SIMETHICONE 80 MG PO CHEW
80.0000 mg | CHEWABLE_TABLET | Freq: Three times a day (TID) | ORAL | Status: DC
  Administered 2019-04-13 – 2019-04-15 (×6): 80 mg via ORAL
  Filled 2019-04-12 (×6): qty 1

## 2019-04-12 MED ORDER — DEXAMETHASONE SODIUM PHOSPHATE 10 MG/ML IJ SOLN
INTRAMUSCULAR | Status: AC
  Filled 2019-04-12: qty 1

## 2019-04-12 MED ORDER — DIPHENHYDRAMINE HCL 50 MG/ML IJ SOLN
INTRAMUSCULAR | Status: AC
  Filled 2019-04-12: qty 1

## 2019-04-12 MED ORDER — PROMETHAZINE HCL 25 MG/ML IJ SOLN: 6.2500 mg | INTRAMUSCULAR | Status: DC | PRN

## 2019-04-12 MED ORDER — KETOROLAC TROMETHAMINE 30 MG/ML IJ SOLN: 30.0000 mg | Freq: Four times a day (QID) | INTRAMUSCULAR | Status: AC | PRN

## 2019-04-12 MED ORDER — SIMETHICONE 80 MG PO CHEW
80.0000 mg | CHEWABLE_TABLET | ORAL | Status: DC
  Administered 2019-04-12 – 2019-04-15 (×3): 80 mg via ORAL
  Filled 2019-04-12 (×3): qty 1

## 2019-04-12 MED ORDER — STERILE WATER FOR IRRIGATION IR SOLN
Status: DC | PRN
  Administered 2019-04-12: 1

## 2019-04-12 MED ORDER — DIBUCAINE (PERIANAL) 1 % EX OINT: 1.0000 "application " | TOPICAL_OINTMENT | CUTANEOUS | Status: DC | PRN

## 2019-04-12 MED ORDER — METOCLOPRAMIDE HCL 5 MG/ML IJ SOLN
INTRAMUSCULAR | Status: DC | PRN
  Administered 2019-04-12: 10 mg via INTRAVENOUS

## 2019-04-12 MED ORDER — ZOLPIDEM TARTRATE 5 MG PO TABS: 5.0000 mg | ORAL_TABLET | Freq: Every evening | ORAL | Status: DC | PRN

## 2019-04-12 MED ORDER — ACETAMINOPHEN 500 MG PO TABS
1000.0000 mg | ORAL_TABLET | Freq: Four times a day (QID) | ORAL | Status: AC
  Administered 2019-04-12 – 2019-04-13 (×3): 1000 mg via ORAL
  Filled 2019-04-12 (×4): qty 2

## 2019-04-12 MED ORDER — LACTATED RINGERS IV SOLN
INTRAVENOUS | Status: DC
  Administered 2019-04-13 (×2): via INTRAVENOUS

## 2019-04-12 MED ORDER — MENTHOL 3 MG MT LOZG: 1.0000 | LOZENGE | OROMUCOSAL | Status: DC | PRN

## 2019-04-12 MED ORDER — SODIUM CHLORIDE 0.9 % IV SOLN
INTRAVENOUS | Status: DC | PRN
  Administered 2019-04-12: 16:00:00 via INTRAVENOUS

## 2019-04-12 MED ORDER — LACTATED RINGERS IV SOLN
INTRAVENOUS | Status: DC
  Administered 2019-04-12 (×2): via INTRAVENOUS

## 2019-04-12 MED ORDER — MEPERIDINE HCL 25 MG/ML IJ SOLN: 6.2500 mg | INTRAMUSCULAR | Status: DC | PRN

## 2019-04-12 MED ORDER — KETOROLAC TROMETHAMINE 30 MG/ML IJ SOLN
INTRAMUSCULAR | Status: AC
  Filled 2019-04-12: qty 1

## 2019-04-12 MED ORDER — PRENATAL MULTIVITAMIN CH
1.0000 | ORAL_TABLET | Freq: Every day | ORAL | Status: DC
  Administered 2019-04-13 – 2019-04-14 (×2): 1 via ORAL
  Filled 2019-04-12 (×2): qty 1

## 2019-04-12 MED ORDER — SCOPOLAMINE 1 MG/3DAYS TD PT72
1.0000 | MEDICATED_PATCH | Freq: Once | TRANSDERMAL | Status: DC
  Administered 2019-04-12: 1.5 mg via TRANSDERMAL

## 2019-04-12 MED ORDER — MORPHINE SULFATE (PF) 0.5 MG/ML IJ SOLN
INTRAMUSCULAR | Status: DC | PRN
  Administered 2019-04-12: .15 mg via INTRATHECAL

## 2019-04-12 MED ORDER — SODIUM CHLORIDE 0.9 % IV SOLN
INTRAVENOUS | Status: DC | PRN
  Administered 2019-04-12: 40 [IU] via INTRAVENOUS

## 2019-04-12 MED ORDER — OXYTOCIN 40 UNITS IN NORMAL SALINE INFUSION - SIMPLE MED
INTRAVENOUS | Status: AC
  Filled 2019-04-12: qty 1000

## 2019-04-12 MED ORDER — BUPIVACAINE IN DEXTROSE 0.75-8.25 % IT SOLN
INTRATHECAL | Status: DC | PRN
  Administered 2019-04-12: 1.4 mL via INTRATHECAL

## 2019-04-12 MED ORDER — PHENYLEPHRINE HCL-NACL 20-0.9 MG/250ML-% IV SOLN
INTRAVENOUS | Status: DC | PRN
  Administered 2019-04-12: 60 ug/min via INTRAVENOUS

## 2019-04-12 MED ORDER — WITCH HAZEL-GLYCERIN EX PADS: 1.0000 "application " | MEDICATED_PAD | CUTANEOUS | Status: DC | PRN

## 2019-04-12 MED ORDER — SIMETHICONE 80 MG PO CHEW: 80.0000 mg | CHEWABLE_TABLET | ORAL | Status: DC | PRN

## 2019-04-12 MED ORDER — CEFAZOLIN SODIUM-DEXTROSE 2-4 GM/100ML-% IV SOLN
INTRAVENOUS | Status: AC
  Filled 2019-04-12: qty 100

## 2019-04-12 MED ORDER — CEFAZOLIN SODIUM-DEXTROSE 2-4 GM/100ML-% IV SOLN
2.0000 g | INTRAVENOUS | Status: AC
  Administered 2019-04-12: 2 g via INTRAVENOUS

## 2019-04-12 MED ORDER — MORPHINE SULFATE (PF) 0.5 MG/ML IJ SOLN
INTRAMUSCULAR | Status: AC
  Filled 2019-04-12: qty 10

## 2019-04-12 MED ORDER — KETOROLAC TROMETHAMINE 30 MG/ML IJ SOLN
30.0000 mg | Freq: Once | INTRAMUSCULAR | Status: AC | PRN
  Administered 2019-04-12: 30 mg via INTRAVENOUS

## 2019-04-12 MED ORDER — ONDANSETRON HCL 4 MG/2ML IJ SOLN
INTRAMUSCULAR | Status: DC | PRN
  Administered 2019-04-12: 4 mg via INTRAVENOUS

## 2019-04-12 MED ORDER — ENOXAPARIN SODIUM 60 MG/0.6ML ~~LOC~~ SOLN
60.0000 mg | SUBCUTANEOUS | Status: DC
  Administered 2019-04-13 – 2019-04-15 (×2): 60 mg via SUBCUTANEOUS
  Filled 2019-04-12 (×2): qty 0.6

## 2019-04-12 MED ORDER — NALOXONE HCL 0.4 MG/ML IJ SOLN: 0.4000 mg | INTRAMUSCULAR | Status: DC | PRN

## 2019-04-12 MED ORDER — COCONUT OIL OIL: 1.0000 "application " | TOPICAL_OIL | Status: DC | PRN

## 2019-04-12 MED ORDER — DEXAMETHASONE SODIUM PHOSPHATE 10 MG/ML IJ SOLN
INTRAMUSCULAR | Status: DC | PRN
  Administered 2019-04-12: 10 mg via INTRAVENOUS

## 2019-04-12 MED ORDER — PHENYLEPHRINE HCL-NACL 20-0.9 MG/250ML-% IV SOLN
INTRAVENOUS | Status: AC
  Filled 2019-04-12: qty 250

## 2019-04-12 MED ORDER — IBUPROFEN 800 MG PO TABS
800.0000 mg | ORAL_TABLET | Freq: Four times a day (QID) | ORAL | Status: DC
  Administered 2019-04-13 – 2019-04-15 (×7): 800 mg via ORAL
  Filled 2019-04-12 (×7): qty 1

## 2019-04-12 MED ORDER — SENNOSIDES-DOCUSATE SODIUM 8.6-50 MG PO TABS
2.0000 | ORAL_TABLET | ORAL | Status: DC
  Administered 2019-04-12 – 2019-04-15 (×3): 2 via ORAL
  Filled 2019-04-12 (×3): qty 2

## 2019-04-12 MED ORDER — SODIUM CHLORIDE 0.9% FLUSH: 3.0000 mL | INTRAVENOUS | Status: DC | PRN

## 2019-04-12 MED ORDER — SCOPOLAMINE 1 MG/3DAYS TD PT72
MEDICATED_PATCH | TRANSDERMAL | Status: AC
  Filled 2019-04-12: qty 1

## 2019-04-12 MED ORDER — ONDANSETRON HCL 4 MG/2ML IJ SOLN
INTRAMUSCULAR | Status: AC
  Filled 2019-04-12: qty 2

## 2019-04-12 SURGICAL SUPPLY — 37 items
BENZOIN TINCTURE PRP APPL 2/3 (GAUZE/BANDAGES/DRESSINGS) ×4 IMPLANT
CHLORAPREP W/TINT 26ML (MISCELLANEOUS) ×2 IMPLANT
CLAMP CORD UMBIL (MISCELLANEOUS) IMPLANT
CLOSURE STERI STRIP 1/2 X4 (GAUZE/BANDAGES/DRESSINGS) ×2 IMPLANT
CLOTH BEACON ORANGE TIMEOUT ST (SAFETY) ×2 IMPLANT
DRSG OPSITE POSTOP 4X10 (GAUZE/BANDAGES/DRESSINGS) ×2 IMPLANT
ELECT REM PT RETURN 9FT ADLT (ELECTROSURGICAL) ×2
ELECTRODE REM PT RTRN 9FT ADLT (ELECTROSURGICAL) ×1 IMPLANT
EXTRACTOR VACUUM KIWI (MISCELLANEOUS) IMPLANT
EXTRACTOR VACUUM M CUP 4 TUBE (SUCTIONS) IMPLANT
GLOVE BIO SURGEON STRL SZ7 (GLOVE) ×2 IMPLANT
GLOVE BIOGEL PI IND STRL 7.0 (GLOVE) ×2 IMPLANT
GLOVE BIOGEL PI INDICATOR 7.0 (GLOVE) ×2
GOWN STRL REUS W/TWL LRG LVL3 (GOWN DISPOSABLE) ×4 IMPLANT
KIT ABG SYR 3ML LUER SLIP (SYRINGE) IMPLANT
KIT PREVENA INCISION MGT20CM45 (CANNISTER) ×2 IMPLANT
NEEDLE HYPO 25X5/8 SAFETYGLIDE (NEEDLE) IMPLANT
NS IRRIG 1000ML POUR BTL (IV SOLUTION) ×2 IMPLANT
PACK C SECTION WH (CUSTOM PROCEDURE TRAY) ×2 IMPLANT
PAD OB MATERNITY 4.3X12.25 (PERSONAL CARE ITEMS) ×2 IMPLANT
RTRCTR C-SECT PINK 25CM LRG (MISCELLANEOUS) IMPLANT
SPONGE LAP 18X18 X RAY DECT (DISPOSABLE) ×2 IMPLANT
STRIP CLOSURE SKIN 1/2X4 (GAUZE/BANDAGES/DRESSINGS) IMPLANT
SUT MNCRL 0 VIOLET CTX 36 (SUTURE) ×2 IMPLANT
SUT MONOCRYL 0 CTX 36 (SUTURE) ×2
SUT PLAIN 0 NONE (SUTURE) IMPLANT
SUT PLAIN 2 0 (SUTURE)
SUT PLAIN ABS 2-0 CT1 27XMFL (SUTURE) IMPLANT
SUT VIC AB 0 CT1 27 (SUTURE) ×2
SUT VIC AB 0 CT1 27XBRD ANBCTR (SUTURE) ×2 IMPLANT
SUT VIC AB 2-0 CT1 27 (SUTURE) ×1
SUT VIC AB 2-0 CT1 TAPERPNT 27 (SUTURE) ×1 IMPLANT
SUT VIC AB 4-0 KS 27 (SUTURE) ×2 IMPLANT
SUT VICRYL 0 TIES 12 18 (SUTURE) IMPLANT
TOWEL OR 17X24 6PK STRL BLUE (TOWEL DISPOSABLE) ×2 IMPLANT
TRAY FOLEY W/BAG SLVR 14FR LF (SET/KITS/TRAYS/PACK) IMPLANT
WATER STERILE IRR 1000ML POUR (IV SOLUTION) ×2 IMPLANT

## 2019-04-12 NOTE — Transfer of Care (Signed)
Immediate Anesthesia Transfer of Care Note  Patient: Nicole Landry  Procedure(s) Performed: Repeat CESAREAN SECTION (N/A )  Patient Location: PACU  Anesthesia Type:Spinal  Level of Consciousness: awake, alert , oriented and patient cooperative  Airway & Oxygen Therapy: Patient Spontanous Breathing  Post-op Assessment: Report given to RN and Post -op Vital signs reviewed and stable  Post vital signs: Reviewed and stable  Last Vitals:  Vitals Value Taken Time  BP 122/68 04/12/19 1627  Temp    Pulse 103 04/12/19 1631  Resp 20 04/12/19 1631  SpO2 97 % 04/12/19 1631  Vitals shown include unvalidated device data.  Last Pain:  Vitals:   04/12/19 1327  TempSrc: Oral      Patients Stated Pain Goal: 4 (41/28/78 6767)  Complications: No apparent anesthesia complications

## 2019-04-12 NOTE — Anesthesia Preprocedure Evaluation (Signed)
Anesthesia Evaluation  Patient identified by MRN, date of birth, ID band Patient awake    Reviewed: Allergy & Precautions, H&P , Patient's Chart, lab work & pertinent test results  Airway Mallampati: III  TM Distance: >3 FB Neck ROM: full    Dental no notable dental hx. (+) Teeth Intact   Pulmonary neg pulmonary ROS,    Pulmonary exam normal breath sounds clear to auscultation       Cardiovascular Exercise Tolerance: Good negative cardio ROS Normal cardiovascular exam Rhythm:regular Rate:Normal     Neuro/Psych negative neurological ROS  negative psych ROS   GI/Hepatic negative GI ROS, Neg liver ROS,   Endo/Other  Morbid obesity  Renal/GU      Musculoskeletal   Abdominal (+) + obese,   Peds  Hematology negative hematology ROS (+)   Anesthesia Other Findings   Reproductive/Obstetrics (+) Pregnancy                             Anesthesia Physical  Anesthesia Plan  ASA: III  Anesthesia Plan: Spinal   Post-op Pain Management:    Induction:   PONV Risk Score and Plan: 3 and Ondansetron, Dexamethasone and Scopolamine patch - Pre-op  Airway Management Planned: Nasal Cannula and Natural Airway  Additional Equipment:   Intra-op Plan:   Post-operative Plan:   Informed Consent: I have reviewed the patients History and Physical, chart, labs and discussed the procedure including the risks, benefits and alternatives for the proposed anesthesia with the patient or authorized representative who has indicated his/her understanding and acceptance.       Plan Discussed with: CRNA  Anesthesia Plan Comments: (  )        Anesthesia Quick Evaluation

## 2019-04-12 NOTE — Anesthesia Procedure Notes (Signed)
Spinal  Patient location during procedure: OR Start time: 04/12/2019 2:58 PM End time: 04/12/2019 3:02 PM Staffing Anesthesiologist: Lyn Hollingshead, MD Performed: anesthesiologist and other anesthesia staff  Preanesthetic Checklist Completed: patient identified, site marked, surgical consent, pre-op evaluation, timeout performed, IV checked, risks and benefits discussed and monitors and equipment checked Spinal Block Patient position: sitting Prep: site prepped and draped and DuraPrep Patient monitoring: continuous pulse ox and blood pressure Approach: midline Location: L3-4 Injection technique: single-shot Needle Needle type: Pencan  Needle gauge: 24 G Needle length: 10 cm Needle insertion depth: 7 cm Assessment Sensory level: T4

## 2019-04-12 NOTE — Op Note (Signed)
Cesarean Section Procedure Note   Nicole Landry  04/12/2019  Procedure: Repeat Low Transverse Cesarean section  Indications: Scheduled Proceedure/Maternal Request 39.2 weeks   Pre-operative Diagnosis: Previous Cesarean Section x2  Post-operative Diagnosis: Same   Surgeon:  Azucena Fallen, MD   Assistants: Derrell Lolling, CNM  Anesthesia: spinal   Procedure Details:  The patient was seen in the Holding Room. The risks, benefits, complications, treatment options, and expected outcomes were discussed with the patient. The patient concurred with the proposed plan, giving informed consent. identified as [name] and the procedure verified as C-Section Delivery. A Time Out was held and the above information confirmed. 3 gm Ancef given.  After induction of anesthesia, Traxee placed to lift pannus and patient was draped and prepped in the usual sterile manner, foley was draining urine well.  A pfannenstiel incision was made and carried down through the subcutaneous tissue to the fascia. Fascial incision was made and extended transversely. The fascia was separated from the underlying rectus tissue superiorly and inferiorly. The peritoneum was identified and entered. Peritoneal incision was extended longitudinally. Alexis-O retractor placed. The utero-vesical peritoneal reflection was incised transversely and the bladder flap was bluntly freed from the lower uterine segment. A low transverse uterine incision was made. Meconium stained copious amniotic fluid drained. Delivered from cephalic presentation was a FEMALE infant with vigorous cry. Nuchal cord was loose and reduced at head delivery over the head. Apgar scores of 8 at one minute and 9 at five minutes. Delayed cord clamping done at 1 minute and baby handed to NICU team in attendance. Cord ph was not sent. Cord blood was obtained for evaluation. True knot noted in cord. The placenta was removed Intact and appeared normal. The uterine  outline, tubes and ovaries appeared normal. The uterine incision was closed with running locked sutures of 0Monocryl. A second imbricating layer sutured.   Hemostasis was observed. Alexis retractor removed. Peritoneal closure done with 2-0 Vicryl.  The fascia was then reapproximated with running sutures of 0Vicryl. The subcuticular closure was performed using 2-0plain gut. The skin was closed with 4-0Vicryl. Pravena negative pressure dressing placed due to large pannus.   Instrument, sponge, and needle counts were correct prior the abdominal closure and were correct at the conclusion of the case.   Findings: Female infant delivered cephalic from [facility] hysterotomy. Loose nuchal cord, meconium stained amniotic fluid. True knot in cord. Birth time 15.31 hours. Apgars 8 and 9.    Estimated Blood Loss: 448 ml   Total IV Fluids: 1000 ml LR  Urine Output: 50 cc clear in foley  Specimens: cord blood   Complications: no complications  Disposition: PACU - hemodynamically stable.   Maternal Condition: stable   Baby condition / location:  Couplet care / Skin to Skin  Attending Attestation: I performed the procedure.   Signed: Surgeon(s): Azucena Fallen, MD

## 2019-04-12 NOTE — Anesthesia Postprocedure Evaluation (Signed)
Anesthesia Post Note  Patient: Nicole Landry  Procedure(s) Performed: Repeat CESAREAN SECTION (N/A )     Patient location during evaluation: PACU Anesthesia Type: Spinal Level of consciousness: awake Pain management: pain level controlled Vital Signs Assessment: post-procedure vital signs reviewed and stable Respiratory status: spontaneous breathing Cardiovascular status: stable Postop Assessment: no apparent nausea or vomiting Anesthetic complications: no    Last Vitals:  Vitals:   04/12/19 1700 04/12/19 1715  BP: 125/77 136/76  Pulse: 82 78  Resp: 18 19  Temp:  37.2 C  SpO2: 98% 99%    Last Pain:  Vitals:   04/12/19 1715  TempSrc: Oral  PainSc: 2    Pain Goal: Patients Stated Pain Goal: 4 (04/12/19 1327)  LLE Motor Response: Purposeful movement (04/12/19 1700) LLE Sensation: Tingling (04/12/19 1700) RLE Motor Response: Purposeful movement (04/12/19 1700) RLE Sensation: Tingling (04/12/19 1700)     Epidural/Spinal Function Cutaneous sensation: Tingles (04/12/19 1700), Patient able to flex knees: Yes (04/12/19 1700), Patient able to lift hips off bed: No (04/12/19 1700), Back pain beyond tenderness at insertion site: No (04/12/19 1700), Progressively worsening motor and/or sensory loss: No (04/12/19 1700), Bowel and/or bladder incontinence post epidural: No (04/12/19 1700)  Huston Foley

## 2019-04-12 NOTE — H&P (Signed)
Nicole Landry is a 33 y.o. female presenting for Repeat C-section, this is her 49rd. Declines tubal ligation. + FMs, no LOF, UCS or VB PNcare from 15 wks after initial sono at 8 weeks   [id number], EDC 04/17/19 by 1st trim scan. 39.2  wks C/section, Ectopic, SAB, then VBAC, then RC/s A1GDM- well controlled, growth sono AGA GBS+  Maternal obesity       Past Medical History:  Diagnosis Date  . Blood transfusion without reported diagnosis    after ectopic  . Chronic kidney disease   . Ectopic pregnancy, tubal   . Gestational diabetes   . Gestational diabetes mellitus 2011   denies gestational DM with current pregnancy  . Ovarian cyst   . Status post vacuum-assisted vaginal delivery (5/17) 11/20/2015   Family History: family history is not on file. Social History:  reports that  has never smoked. she has never used smokeless tobacco. She reports that she does not drink alcohol or use drugs.     Maternal Diabetes: A1GDM  Genetic Screening: Normal Maternal Ultrasounds/Referrals: Normal Fetal Ultrasounds or other Referrals:  None Maternal Substance Abuse:  No Significant Maternal Medications:  none Significant Maternal Lab Results:  GBS(+) Other Comments:  None  History ROS- neg  BP 133/87   Temp 99.1 F (37.3 C) (Oral)   Resp 18   Ht 5\' 3"  (1.6 m)   Wt 112.5 kg   LMP 06/30/2018   SpO2 98%   BMI 43.93 kg/m   Physical exam:  A&O x 3, no acute distress. Pleasant HEENT neg, no thyromegaly Lungs CTA bilat CV RRR, S1S2 normal Abdo soft, non tender, non acute Extr no edema/ tenderness Pelvic deferred FHT 140s Toco none  Prenatal labs: ABO, Rh: --/--/O POS (02/12 2458) Antibody: NEG (02/12 0835) Rubella: Immune (07/27 0000) RPR: Nonreactive (07/27 0000)  HBsAg: Negative (07/27 0000)  HIV: Non Reactive (12/24 0144)  GBS: Positive (01/28 0000)   Assessment/Plan: [id number], at 39.2 wks, for elective repeat C-section. Declines sterilization.   Risks/complications of surgery reviewed incl infection, bleeding, damage to internal organs including bladder, bowels, ureters, blood vessels, other risks from anesthesia, VTE and delayed complications of any surgery, complications in future surgery reviewed. Also discussed neonatal complications incl difficult delivery, laceration, vacuum assistance, TTN etc. Pt understands and agrees, all concerns addressed.    [name] MD

## 2019-04-13 LAB — CBC
HCT: 28 % — ABNORMAL LOW (ref 36.0–46.0)
Hemoglobin: 9 g/dL — ABNORMAL LOW (ref 12.0–15.0)
MCH: 28 pg (ref 26.0–34.0)
MCHC: 32.1 g/dL (ref 30.0–36.0)
MCV: 87 fL (ref 80.0–100.0)
Platelets: 216 10*3/uL (ref 150–400)
RBC: 3.22 MIL/uL — ABNORMAL LOW (ref 3.87–5.11)
RDW: 15.7 % — ABNORMAL HIGH (ref 11.5–15.5)
WBC: 12.1 10*3/uL — ABNORMAL HIGH (ref 4.0–10.5)
nRBC: 0 % (ref 0.0–0.2)

## 2019-04-13 MED ORDER — SODIUM CHLORIDE 0.9 % IV SOLN
510.0000 mg | Freq: Once | INTRAVENOUS | Status: AC
  Administered 2019-04-13: 510 mg via INTRAVENOUS
  Filled 2019-04-13: qty 17

## 2019-04-13 MED ORDER — AMMONIA AROMATIC IN INHA
RESPIRATORY_TRACT | Status: AC
  Filled 2019-04-13: qty 10

## 2019-04-13 MED ORDER — MAGNESIUM OXIDE 400 (241.3 MG) MG PO TABS
400.0000 mg | ORAL_TABLET | Freq: Every day | ORAL | Status: DC
  Administered 2019-04-14: 400 mg via ORAL
  Filled 2019-04-13: qty 1

## 2019-04-13 MED ORDER — LACTATED RINGERS IV BOLUS
300.0000 mL | Freq: Once | INTRAVENOUS | Status: AC
  Administered 2019-04-13: 300 mL via INTRAVENOUS

## 2019-04-13 MED ORDER — POLYSACCHARIDE IRON COMPLEX 150 MG PO CAPS
150.0000 mg | ORAL_CAPSULE | Freq: Every day | ORAL | Status: DC
  Administered 2019-04-14: 150 mg via ORAL
  Filled 2019-04-13: qty 1

## 2019-04-13 NOTE — Progress Notes (Signed)
POSTOPERATIVE DAY # 1 S/P Repeat LTCS, baby boy    S:         Reports feeling okay             Tolerating po intake / no nausea / no vomiting / + flatus / no BM  Reports some dizziness with standing; improved this morning, but was only able to stand for 1 minute before feeling dizzy  C/o itching  Denies SOB, or CP             Bleeding is light             Pain controlled with Toradol             Up with assistance; foley catheter just removed; has not voided on her own yet  Newborn breast feeding with formula supplementation - baby on phototherapy in the room/ Circumcision - declined   O:  VS: BP 121/70 (BP Location: Left Arm)   Pulse 94   Temp 98.6 F (37 C) (Oral)   Resp 18   Ht 5\' 3"  (1.6 m)   Wt 112.5 kg   LMP 06/30/2018   SpO2 100%   Breastfeeding Unknown   BMI 43.93 kg/m    LABS:               Recent Labs    04/12/19 2004 04/13/19 0533  WBC 13.2* 12.1*  HGB 10.9* 9.0*  PLT 246 216               Bloodtype: --/--/O POS, O POS Performed at [facility], [address], [place], [place]  [phone]/05 0930)  Rubella: Immune (04/23 0000)                                             I&O: Intake/Output      10/07 0701 - 10/08 0700 10/08 0701 - 10/09 0700   P.O. 960    I.V. (mL/kg) 1422.6 (12.6)    Total Intake(mL/kg) 2382.6 (21.2)    Urine (mL/kg/hr) 1785 250 (0.5)   Blood 448    Total Output 2233 250   Net +149.6 -250                     Physical Exam:             Alert and Oriented X3  Lungs: Clear and unlabored  Heart: regular rate and rhythm / no murmurs  Abdomen: soft, non-tender, obese, (+) active bowel sounds in all quadrants             Fundus: firm, non-tender, U-1             Dressing : Prevena wound dressing intact              Incision:  approximated with sutures / unable to visualize due to wound dressing  erythema  Perineum: intact  Lochia: small rubra on pad   Extremities: trace LE edema, no calf pain or tenderness   POD # 1 S/P Repeat LTCS            ABL Anemia compounding chronic IDA dizziness   - Feraheme IVBPx 1 dose, then begin Niferex 150mg  daily and Magnesium oxide 400mg  daily  Dizziness   - Improving    - ? Related to medications  Obesity    -  on Lovenox prophylaxis; needs to ambulate   A1GDM   - f/u PP with 2 hr GTT  Routine postoperative care   See lactation today   May shower today   Interested in early d/c home tomorrow   Lars Pinks, MSN, CNM [facility]

## 2019-04-13 NOTE — Progress Notes (Signed)
   04/13/19 0050  Orthostatic Lying   BP- Lying 118/66  Pulse- Lying 109  Orthostatic Sitting  BP- Sitting 125/87  Pulse- Sitting 114  Orthostatic Standing at 0 minutes  BP- Standing at 0 minutes 164/84  Pulse- Standing at 0 minutes 150  During orthostatic vitals, mom became very dizzy when standing so we sat her down and she started going in and out of consciousness. We got ammonia and got her to become more alert. We laid her flat and got another blood pressure and it was 140/80, pulse 112. Fundus firm, U/E, small bleeding. Notified Derrell Lolling, CNM of her status and she wanted a 300 mL bolus and to notify her if her output drops below 30 ml/hr. Will continue to closely monitor.

## 2019-04-13 NOTE — Lactation Note (Signed)
This note was copied from a baby's chart. Lactation Consultation Note Baby 19 hrs old. On Triple Photo Therapy. MD ordered to supplement w/formula after BF. Mom is doing so. Mom stated her milk will probably come in am. Mom states she milk usually come in early. Mom has large pendulous breast. Baby latched on well. Body posture discussed to ensure deep latch and good milk transfer. Discussed w/mom using DEBP mom agreed. Mention to RN. Baby will probably transferred to NICU in am after serum drawn. Discussed jaundice, baby being sleepy, cueing, latching, supply and demand. Mom states has no questions at this time. Encouraged to call for questions if needed. Lactation brochure given.  Patient Name: Nicole Landry PXTGG'Y Date: 04/13/2019 Reason for consult: Initial assessment;Primapara   Maternal Data Has patient been taught Hand Expression?: Yes Does the patient have breastfeeding experience prior to this delivery?: Yes  Feeding Feeding Type: Breast Fed Nipple Type: Slow - flow  LATCH Score Latch: Grasps breast easily, tongue down, lips flanged, rhythmical sucking.  Audible Swallowing: Spontaneous and intermittent  Type of Nipple: Everted at rest and after stimulation  Comfort (Breast/Nipple): Filling, red/small blisters or bruises, mild/mod discomfort  Hold (Positioning): Assistance needed to correctly position infant at breast and maintain latch.  LATCH Score: 8  Interventions Interventions: Breast feeding basics reviewed;Adjust position;DEBP;Assisted with latch;Support pillows;Ice;Skin to skin;Position options  Lactation Tools Discussed/Used WIC Program: No   Consult Status Consult Status: Follow-up Date: 04/14/19 Follow-up type: In-patient    Jamari Moten, Elta Gu[place]oupe 04/13/2019, 2:54 AM

## 2019-04-13 NOTE — Progress Notes (Signed)
Patient dizzy upon standing at 0930. Patient was advised to eat breakfast before standing and patient refused. CNM aware of dizziness. IV iron ordered for patient. Patient ate a large sandwich for lunch with juice. Patient ambulated to bathroom afterwards at 1400 wth the assistance of RN and NT. Patient felt dizziness subsided but mainly feels stretching/sharp pain from her incision. Patient was able to shower and has been up ad lib. Foley removed at 1000. Patient tried to void at 1400 but unsuccessful. Patient voided 258ml at 1700 and 357ml at 1800.

## 2019-04-14 MED ORDER — INFLUENZA VAC SPLIT QUAD 0.5 ML IM SUSY: 0.5000 mL | PREFILLED_SYRINGE | INTRAMUSCULAR | Status: DC

## 2019-04-14 MED ORDER — TRAMADOL HCL 50 MG PO TABS
50.0000 mg | ORAL_TABLET | Freq: Once | ORAL | Status: AC
  Administered 2019-04-14: 50 mg via ORAL
  Filled 2019-04-14: qty 1

## 2019-04-14 MED ORDER — TRAMADOL HCL 50 MG PO TABS
50.0000 mg | ORAL_TABLET | Freq: Four times a day (QID) | ORAL | Status: DC
  Administered 2019-04-14 – 2019-04-15 (×4): 50 mg via ORAL
  Filled 2019-04-14 (×4): qty 1

## 2019-04-14 NOTE — Progress Notes (Addendum)
POSTOPERATIVE DAY # 2 S/P repeat CS  Subjective:  reports feeling frustrated - wants to go home asap tolerating po intake / no nausea / no vomiting / + flatus / no BM pain marginally controlled  up ad lib / not ambulating due to pain with dressing /  voiding QS  Newborn Breast / phototherapy in place - awaiting Peds visit this am for discharge planning No circumcision  Objective: VS: BP 120/80 (BP Location: Right Arm)   Pulse 93   Temp 98.3 F (36.8 C) (Oral)   Resp 16   Ht 5\' 3"  (1.6 m)   Wt 112.5 kg   LMP 06/30/2018   SpO2 98%   Breastfeeding Unknown   BMI 43.93 kg/m        LABS:  Recent Labs    04/12/19 2004 04/13/19 0533  WBC 13.2* 12.1*  HGB 10.9* 9.0*  PLT 246 216    Blood type: O pos  Rubella: Immune (04/23 0000)        tdap current / offer flu vaccine prior to DC home                                    I&O: Intake/Output      10/08 0701 - 10/09 0700 10/09 0701 - 10/10 0700   P.O.     I.V. (mL/kg) 759.5 (6.8)    IV Piggyback 117    Total Intake(mL/kg) 876.5 (7.8)    Urine (mL/kg/hr) 750 (0.3)    Blood     Total Output 750    Net +126.5          Physical Exam: alert and oriented X3 without any distress or pain lung fields are clear and unlabored heart rate regular / normal rhythm / no mumurs abdomen soft, non-tender, non-distended  / bowel sounds are active uterine fundus firm, non-tender, Ueven surgical incision assessed with Provena dressing intact without drainage extremities: 1+ dependent edema, no calf pain or tenderness   Assessment / Plan:     POD # 2 S/P repeat CS Iron deficiency anemia [id number] - poor compliance / control routine postoperative care   Reassess status for discharge after Peds visits - wants early DC Adjust analgesia for better pain control  Artelia Laroche CNM, MSN, [facility] 04/14/2019, 8:07 AM   04/14/2019 @ 1615PM Addendum: no discharge today / baby remains in phototherapy. Plan DC tomorrow AM.

## 2019-04-14 NOTE — Lactation Note (Signed)
This note was copied from a baby's chart. 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  Baby is 54 hrs old and at 3% weight loss with good output. Baby is latching well with no discomfort, and getting formula supplementation after most breastfeedings.   Mom had just fed baby 10 mins on the breast and was still cueing that he is hungry.    LC washed pump parts and encouraged Mom to pump both breasts after baby feeds to support her milk supply due to sleepiness with jaundice.  Mom went to bathroom and planning to breastfeed baby again.  Mom taking baby off the phototherapy during feedings.  Showed Mom how to place phototherapy paddle over baby when he is breastfeeding.  Mom resisting politely any assistance with pillow support etc.   Plan- 1- Offer breast often with cues 2- pump both breasts 15-20 mins  3- supplement with EBM+/formula 15-30 ml by paced bottle 4- ask for help prn.   Consult Status Consult Status: Follow-up Date: 04/15/19 Follow-up type: In-patient    Broadus John 04/14/2019, 9:47 AM

## 2019-04-15 MED ORDER — SIMETHICONE 80 MG PO CHEW: 80.0000 mg | CHEWABLE_TABLET | Freq: Three times a day (TID) | ORAL | 0 refills | Status: DC

## 2019-04-15 MED ORDER — OXYCODONE HCL 5 MG PO TABS: 5.0000 mg | ORAL_TABLET | Freq: Three times a day (TID) | ORAL | 0 refills | Status: AC | PRN

## 2019-04-15 MED ORDER — POLYSACCHARIDE IRON COMPLEX 150 MG PO CAPS: 150.0000 mg | ORAL_CAPSULE | Freq: Every day | ORAL | Status: DC

## 2019-04-15 MED ORDER — ACETAMINOPHEN 500 MG PO TABS: 1000.0000 mg | ORAL_TABLET | Freq: Four times a day (QID) | ORAL | 0 refills | Status: DC | PRN

## 2019-04-15 MED ORDER — IBUPROFEN 800 MG PO TABS: 800.0000 mg | ORAL_TABLET | Freq: Four times a day (QID) | ORAL | 0 refills | Status: DC

## 2019-04-15 MED ORDER — SIMETHICONE 80 MG PO CHEW: 80.0000 mg | CHEWABLE_TABLET | ORAL | 0 refills | Status: DC | PRN

## 2019-04-15 MED ORDER — MAGNESIUM OXIDE 400 (241.3 MG) MG PO TABS: 400.0000 mg | ORAL_TABLET | Freq: Every day | ORAL | Status: DC

## 2019-04-15 NOTE — Discharge Summary (Signed)
OB Discharge Summary  Patient Name: Nicole Landry DOB: 1985-09-16 MRN: [id number]  Date of admission: 04/12/2019 Delivering provider: MODY, VAISHALI   Date of discharge: 04/15/2019  Admitting diagnosis: Previous Cesarean Section Intrauterine pregnancy: [redacted]w[redacted]d     Secondary diagnosis:Principal Problem:   Postpartum care following cesarean delivery 10/7 Active Problems:   Cesarean delivery / repeat   Status post repeat low transverse cesarean section  Additional problems:obesity     Discharge diagnosis:  Patient Active Problem List   Diagnosis Date Noted  . Cesarean delivery / repeat 04/12/2019  . Status post repeat low transverse cesarean section 04/12/2019  . Postpartum care following cesarean delivery 10/7 08/19/2017                                                                Post partum procedures:none  Pain control: Spinal   Complications: None   Hospital course:  Sceduled C/S   33 y.o. yo [id number] at [redacted]w[redacted]d was admitted to the hospital 04/12/2019 for scheduled cesarean section with the following indication:Elective Repeat.  Membrane Rupture Time/Date: 3:30 PM ,04/12/2019   Patient delivered a Viable infant.04/12/2019  Details of operation can be found in separate operative note.  Pateint had an uncomplicated postpartum course.  She is ambulating, tolerating a regular diet, passing flatus, and urinating well. Patient is discharged home in stable condition on  04/15/19         Physical exam  Vitals:   04/13/19 2115 04/14/19 0514 04/15/19 0010 04/15/19 0549  BP: 136/87 120/80 131/75 140/81  Pulse: (!) 108 93 (!) 102 92  Resp: 20 16 18 18   Temp: 98.3 F (36.8 C) 98.3 F (36.8 C) 98.4 F (36.9 C) 98 F (36.7 C)  TempSrc:  Oral  Oral  SpO2:   98% 100%  Weight:      Height:       General: alert, cooperative and no distress Lochia: appropriate Uterine Fundus: firm Incision: Prevena dressing patent, no redness or notable discharge DVT Evaluation: No cords or  calf tenderness. No significant calf/ankle edema. Labs: Lab Results  Component Value Date   WBC 12.1 (H) 04/13/2019   HGB 9.0 (L) 04/13/2019   HCT 28.0 (L) 04/13/2019   MCV 87.0 04/13/2019   PLT 216 04/13/2019   CMP Latest Ref Rng & Units 04/12/2019  Glucose 70 - 99 mg/dL -  BUN 6 - 20 mg/dL -  Creatinine 06/12/2019 - 8.34 mg/dL 1.96  Sodium 2.22 - 979 mmol/L -  Potassium 3.5 - 5.1 mmol/L -  Chloride 98 - 111 mmol/L -  CO2 22 - 32 mmol/L -  Calcium 8.9 - 10.3 mg/dL -  Total Protein 6.5 - 8.1 g/dL -  Total Bilirubin 0.3 - 1.2 mg/dL -  Alkaline Phos 38 - 892 U/L -  AST 15 - 41 U/L -  ALT 14 - 54 U/L -    Vaccines: TDaP UTD         Flu    ordered  Discharge instruction: per After Visit Summary and "Baby and Me Booklet".  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     Vicodin [hydrocodone-acetaminophen] Other (See Comments)  Reaction:  Seizures; has tolerated acetaminophen and other narcotics in the past      Medication List    STOP taking these medications   ferrous sulfate 325 (65 FE) MG tablet     TAKE these medications   acetaminophen 500 MG tablet Commonly known as: TYLENOL Take 2 tablets (1,000 mg total) by mouth every 6 (six) hours as needed for mild pain (temperature > 101.5.).   ibuprofen 800 MG tablet Commonly known as: ADVIL Take 1 tablet (800 mg total) by mouth every 6 (six) hours.   iron polysaccharides 150 MG capsule Commonly known as: NIFEREX Take 1 capsule (150 mg total) by mouth daily.   magnesium oxide 400 (241.3 Mg) MG tablet Commonly known as: MAG-OX Take 1 tablet (400 mg total) by mouth daily.   oxyCODONE 5 MG immediate release tablet Commonly known as: Roxicodone Take 1 tablet (5 mg total) by mouth every 8 (eight) hours as needed.   prenatal multivitamin Tabs tablet Take 1 tablet by mouth daily at 12 noon.   simethicone 80 MG chewable  tablet Commonly known as: MYLICON Chew 1 tablet (80 mg total) by mouth 3 (three) times daily after meals.   simethicone 80 MG chewable tablet Commonly known as: MYLICON Chew 1 tablet (80 mg total) by mouth as needed for flatulence.   vitamin C 500 MG tablet Commonly known as: ASCORBIC ACID Take 500 mg by mouth daily.   Vitamin D3 50 MCG (2000 UT) Tabs Take 2,000 Units by mouth daily.       Diet: routine diet  Activity: Advance as tolerated. Pelvic rest for 6 weeks.   Postpartum contraception: to be addressed at [facility] visit  Newborn Data: Live born female  Birth Weight: 8 lb 11.3 oz (3950 g) APGAR: 62, 9  Newborn Delivery   Birth date/time: 04/12/2019 15:31:00 Delivery type: C-Section, Low Transverse Trial of labor: No C-section categorization: Repeat      Baby Feeding: Breast Disposition:home with mother   Delivery Report:  Review the Delivery Report for details.    Follow up: Follow-up Information    Obgyn, Chief Operating Officer. Schedule an appointment as soon as possible for a visit on 04/17/2019.   Why: Dressing change Contact information: [phone] [place] [address] [phone]             Signed: Juliene Pina, CNM, MSN 04/15/2019, 11:16 AM

## 2019-04-15 NOTE — Lactation Note (Signed)
This note was copied from a baby's chart. Lactation Consultation Note  Patient Name: Nicole Landry Date: 04/15/2019 Reason for consult: Follow-up assessment;Term Baby is 66 hours old/6% weight loss.  Phototherapy discontinued and mom and baby will be discharged.  Mom reports her milk is in.  Breasts are comfortable.  Baby is feeding without difficulty.  No questions or concerns.  Reviewed outpatient services and encouraged to call prn.  Maternal Data    Feeding Feeding Type: Breast Fed  LATCH Score Latch: Grasps breast easily, tongue down, lips flanged, rhythmical sucking.  Audible Swallowing: A few with stimulation  Type of Nipple: Everted at rest and after stimulation  Comfort (Breast/Nipple): Soft / non-tender  Hold (Positioning): No assistance needed to correctly position infant at breast.  LATCH Score: 9  Interventions    Lactation Tools Discussed/Used     Consult Status Consult Status: Complete Follow-up type: Call as needed    Ave Filter 04/15/2019, 10:05 AM

## 2019-06-23 ENCOUNTER — Other Ambulatory Visit: Payer: Self-pay

## 2019-06-23 ENCOUNTER — Encounter (HOSPITAL_COMMUNITY): Payer: Self-pay | Admitting: Emergency Medicine

## 2019-06-23 ENCOUNTER — Emergency Department (HOSPITAL_COMMUNITY): Payer: BC Managed Care – PPO

## 2019-06-23 ENCOUNTER — Emergency Department (HOSPITAL_COMMUNITY)
Admission: EM | Admit: 2019-06-23 | Discharge: 2019-06-23 | Disposition: A | Discharge status: Home / Self Care | Payer: BC Managed Care – PPO | Attending: Emergency Medicine | Admitting: Emergency Medicine

## 2019-06-23 DIAGNOSIS — Y9289 Other specified places as the place of occurrence of the external cause: Secondary | ICD-10-CM | POA: Diagnosis not present

## 2019-06-23 DIAGNOSIS — S0990XA Unspecified injury of head, initial encounter: Secondary | ICD-10-CM | POA: Diagnosis not present

## 2019-06-23 DIAGNOSIS — Z79899 Other long term (current) drug therapy: Secondary | ICD-10-CM | POA: Insufficient documentation

## 2019-06-23 DIAGNOSIS — R079 Chest pain, unspecified: Secondary | ICD-10-CM | POA: Diagnosis not present

## 2019-06-23 DIAGNOSIS — Y999 Unspecified external cause status: Secondary | ICD-10-CM | POA: Insufficient documentation

## 2019-06-23 DIAGNOSIS — R519 Headache, unspecified: Secondary | ICD-10-CM | POA: Insufficient documentation

## 2019-06-23 DIAGNOSIS — Y9389 Activity, other specified: Secondary | ICD-10-CM | POA: Diagnosis not present

## 2019-06-23 DIAGNOSIS — S299XXA Unspecified injury of thorax, initial encounter: Secondary | ICD-10-CM | POA: Diagnosis not present

## 2019-06-23 MED ORDER — ACETAMINOPHEN 325 MG PO TABS
650.0000 mg | ORAL_TABLET | Freq: Once | ORAL | Status: AC
  Administered 2019-06-23: 650 mg via ORAL
  Filled 2019-06-23: qty 2
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
## 2019-06-23 NOTE — ED Notes (Signed)
Pt verbalized understanding of d/c instructions, follow up care and s/s requiring return to ed.
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
## 2019-07-13 ENCOUNTER — Emergency Department (HOSPITAL_COMMUNITY)
Admission: EM | Admit: 2019-07-13 | Discharge: 2019-07-14 | Discharge status: Against Medical Advice | Payer: BC Managed Care – PPO | Attending: Emergency Medicine | Admitting: Emergency Medicine

## 2019-07-13 ENCOUNTER — Other Ambulatory Visit: Payer: Self-pay

## 2019-07-13 ENCOUNTER — Encounter (HOSPITAL_COMMUNITY): Payer: Self-pay | Admitting: Emergency Medicine

## 2019-07-13 DIAGNOSIS — Z5320 Procedure and treatment not carried out because of patient's decision for unspecified reasons: Secondary | ICD-10-CM | POA: Diagnosis not present

## 2019-07-13 DIAGNOSIS — R519 Headache, unspecified: Secondary | ICD-10-CM | POA: Diagnosis not present

## 2019-07-13 DIAGNOSIS — R110 Nausea: Secondary | ICD-10-CM | POA: Insufficient documentation

## 2019-07-13 DIAGNOSIS — Z5321 Procedure and treatment not carried out due to patient leaving prior to being seen by health care provider: Secondary | ICD-10-CM | POA: Diagnosis not present

## 2019-07-13 DIAGNOSIS — N189 Chronic kidney disease, unspecified: Secondary | ICD-10-CM | POA: Diagnosis not present

## 2019-07-13 DIAGNOSIS — H538 Other visual disturbances: Secondary | ICD-10-CM | POA: Diagnosis not present

## 2019-07-13 DIAGNOSIS — Z79899 Other long term (current) drug therapy: Secondary | ICD-10-CM | POA: Insufficient documentation

## 2019-07-13 LAB — BASIC METABOLIC PANEL
Anion gap: 7 (ref 5–15)
BUN: 13 mg/dL (ref 6–20)
CO2: 31 mmol/L (ref 22–32)
Calcium: 9.6 mg/dL (ref 8.9–10.3)
Chloride: 104 mmol/L (ref 98–111)
Creatinine, Ser: 0.77 mg/dL (ref 0.44–1.00)
GFR calc Af Amer: >60 mL/min (ref >60)
GFR calc non Af Amer: >60 mL/min (ref >60)
Glucose, Bld: 100 mg/dL — ABNORMAL HIGH (ref 70–99)
Potassium: 3.8 mmol/L (ref 3.5–5.1)
Sodium: 142 mmol/L (ref 135–145)

## 2019-07-13 LAB — CBC WITH DIFFERENTIAL/PLATELET
Abs Immature Granulocytes: 0.01 10*3/uL (ref 0.00–0.07)
Basophils Absolute: 0 10*3/uL (ref 0.0–0.1)
Basophils Relative: 0 %
Eosinophils Absolute: 0.1 10*3/uL (ref 0.0–0.5)
Eosinophils Relative: 1 %
HCT: 37.7 % (ref 36.0–46.0)
Hemoglobin: 11.8 g/dL — ABNORMAL LOW (ref 12.0–15.0)
Immature Granulocytes: 0 %
Lymphocytes Relative: 37 %
Lymphs Abs: 1.8 10*3/uL (ref 0.7–4.0)
MCH: 27.4 pg (ref 26.0–34.0)
MCHC: 31.3 g/dL (ref 30.0–36.0)
MCV: 87.5 fL (ref 80.0–100.0)
Monocytes Absolute: 0.3 10*3/uL (ref 0.1–1.0)
Monocytes Relative: 6 %
Neutro Abs: 2.6 10*3/uL (ref 1.7–7.7)
Neutrophils Relative %: 56 %
Platelets: 336 10*3/uL (ref 150–400)
RBC: 4.31 MIL/uL (ref 3.87–5.11)
RDW: 13.7 % (ref 11.5–15.5)
WBC: 4.7 10*3/uL (ref 4.0–10.5)
nRBC: 0 % (ref 0.0–0.2)

## 2019-07-13 LAB — I-STAT BETA HCG BLOOD, ED (MC, WL, AP ONLY): I-stat hCG, quantitative: <5 m[IU]/mL (ref <5)
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
## 2019-07-14 ENCOUNTER — Emergency Department (HOSPITAL_COMMUNITY)
Admission: EM | Admit: 2019-07-14 | Discharge: 2019-07-14 | Discharge status: Against Medical Advice | Payer: BC Managed Care – PPO | Source: Home / Self Care | Attending: Emergency Medicine | Admitting: Emergency Medicine

## 2019-07-14 ENCOUNTER — Encounter (HOSPITAL_COMMUNITY): Payer: Self-pay | Admitting: *Deleted

## 2019-07-14 ENCOUNTER — Ambulatory Visit (HOSPITAL_COMMUNITY)
Admission: EM | Admit: 2019-07-14 | Discharge: 2019-07-14 | Disposition: A | Discharge status: Home / Self Care | Payer: BC Managed Care – PPO | Source: Home / Self Care

## 2019-07-14 ENCOUNTER — Telehealth: Payer: BC Managed Care – PPO

## 2019-07-14 DIAGNOSIS — R519 Headache, unspecified: Secondary | ICD-10-CM

## 2019-07-14 DIAGNOSIS — H538 Other visual disturbances: Secondary | ICD-10-CM | POA: Diagnosis not present

## 2019-07-14 MED ORDER — METOCLOPRAMIDE HCL 5 MG/ML IJ SOLN: 10.0000 mg | Freq: Once | INTRAMUSCULAR | Status: DC

## 2019-07-14 MED ORDER — SODIUM CHLORIDE 0.9 % IV BOLUS: 1000.0000 mL | Freq: Once | INTRAVENOUS | Status: DC
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
## 2019-07-14 NOTE — ED Notes (Signed)
After speaking with Dr. Delton See, it was determined that the patient would need to follow up at the ED for possible CT scan given the symptoms she is having.  Patient verbalized understanding.
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
## 2019-07-14 NOTE — ED Notes (Signed)
Pt called x3 for room, no answer.  

## 2019-07-14 NOTE — ED Notes (Signed)
Pt d/c AMA, D/C summary reviewed, ambulatory off unit.

## 2019-07-14 NOTE — Discharge Instructions (Addendum)
We recommended you stay for repeat imaging of your head and treatment of your headache.  Please follow up closely outpatient regarding your ongoing symptoms.  It is important you return to the ER if your symptoms change or worsen in any way.

## 2019-07-14 NOTE — ED Notes (Signed)
Patient evaluated by Dr. Delton See, who recommended visiting the ED for her sx. Patient verbalized understanding and will go to the ED for evaluation.

## 2019-08-01 ENCOUNTER — Telehealth: Payer: Self-pay

## 2019-08-01 NOTE — Telephone Encounter (Signed)
Left message for patient to call back to schedule in concussion clinic.  

## 2019-08-11 DIAGNOSIS — F0781 Postconcussional syndrome: Secondary | ICD-10-CM | POA: Diagnosis not present

## 2019-09-01 DIAGNOSIS — J029 Acute pharyngitis, unspecified: Secondary | ICD-10-CM | POA: Diagnosis not present

## 2019-09-29 DIAGNOSIS — F0781 Postconcussional syndrome: Secondary | ICD-10-CM | POA: Diagnosis not present

## 2020-02-05 DIAGNOSIS — F4320 Adjustment disorder, unspecified: Secondary | ICD-10-CM | POA: Diagnosis not present

## 2020-02-19 DIAGNOSIS — F4320 Adjustment disorder, unspecified: Secondary | ICD-10-CM | POA: Diagnosis not present

## 2020-03-06 DIAGNOSIS — F4320 Adjustment disorder, unspecified: Secondary | ICD-10-CM | POA: Diagnosis not present

## 2020-03-22 DIAGNOSIS — F4320 Adjustment disorder, unspecified: Secondary | ICD-10-CM | POA: Diagnosis not present

## 2020-07-19 ENCOUNTER — Other Ambulatory Visit: Payer: Self-pay

## 2020-07-19 ENCOUNTER — Inpatient Hospital Stay (HOSPITAL_COMMUNITY): Payer: Medicaid Other

## 2020-07-19 ENCOUNTER — Inpatient Hospital Stay (HOSPITAL_COMMUNITY)
Admission: AD | Admit: 2020-07-19 | Discharge: 2020-07-19 | Disposition: A | Discharge status: Home / Self Care | Payer: Medicaid Other | Attending: Obstetrics & Gynecology | Admitting: Obstetrics & Gynecology

## 2020-07-19 DIAGNOSIS — O4691 Antepartum hemorrhage, unspecified, first trimester: Secondary | ICD-10-CM

## 2020-07-19 DIAGNOSIS — O09521 Supervision of elderly multigravida, first trimester: Secondary | ICD-10-CM | POA: Insufficient documentation

## 2020-07-19 DIAGNOSIS — O99011 Anemia complicating pregnancy, first trimester: Secondary | ICD-10-CM | POA: Diagnosis not present

## 2020-07-19 DIAGNOSIS — Z3A01 Less than 8 weeks gestation of pregnancy: Secondary | ICD-10-CM

## 2020-07-19 DIAGNOSIS — O34219 Maternal care for unspecified type scar from previous cesarean delivery: Secondary | ICD-10-CM | POA: Insufficient documentation

## 2020-07-19 DIAGNOSIS — Z3490 Encounter for supervision of normal pregnancy, unspecified, unspecified trimester: Secondary | ICD-10-CM

## 2020-07-19 DIAGNOSIS — O99891 Other specified diseases and conditions complicating pregnancy: Secondary | ICD-10-CM | POA: Diagnosis not present

## 2020-07-19 DIAGNOSIS — R8290 Unspecified abnormal findings in urine: Secondary | ICD-10-CM | POA: Diagnosis not present

## 2020-07-19 DIAGNOSIS — O209 Hemorrhage in early pregnancy, unspecified: Secondary | ICD-10-CM | POA: Diagnosis present

## 2020-07-19 DIAGNOSIS — D649 Anemia, unspecified: Secondary | ICD-10-CM | POA: Diagnosis not present

## 2020-07-19 LAB — URINALYSIS, ROUTINE W REFLEX MICROSCOPIC
Bilirubin Urine: NEGATIVE
Glucose, UA: NEGATIVE mg/dL
Ketones, ur: NEGATIVE mg/dL
Nitrite: NEGATIVE
Protein, ur: 30 mg/dL — AB
Specific Gravity, Urine: 1.021 (ref 1.005–1.030)
WBC, UA: >50 WBC/hpf — ABNORMAL HIGH (ref 0–5)
pH: 6 (ref 5.0–8.0)

## 2020-07-19 LAB — WET PREP, GENITAL
Clue Cells Wet Prep HPF POC: NONE SEEN
Sperm: NONE SEEN
Trich, Wet Prep: NONE SEEN
Yeast Wet Prep HPF POC: NONE SEEN

## 2020-07-19 LAB — HCG, QUANTITATIVE, PREGNANCY: hCG, Beta Chain, Quant, S: 14567 m[IU]/mL — ABNORMAL HIGH (ref <5)

## 2020-07-19 LAB — CBC
HCT: 26.1 % — ABNORMAL LOW (ref 36.0–46.0)
Hemoglobin: 7.4 g/dL — ABNORMAL LOW (ref 12.0–15.0)
MCH: 19.4 pg — ABNORMAL LOW (ref 26.0–34.0)
MCHC: 28.4 g/dL — ABNORMAL LOW (ref 30.0–36.0)
MCV: 68.3 fL — ABNORMAL LOW (ref 80.0–100.0)
Platelets: 462 10*3/uL — ABNORMAL HIGH (ref 150–400)
RBC: 3.82 MIL/uL — ABNORMAL LOW (ref 3.87–5.11)
RDW: 20.5 % — ABNORMAL HIGH (ref 11.5–15.5)
WBC: 6.6 10*3/uL (ref 4.0–10.5)
nRBC: 0 % (ref 0.0–0.2)

## 2020-07-19 LAB — POCT PREGNANCY, URINE: Preg Test, Ur: POSITIVE — AB

## 2020-07-19 NOTE — MAU Provider Note (Signed)
History     CSN: [id number]  Arrival date and time: 07/19/20 1948   Event Date/Time   First Provider Initiated Contact with Patient 07/19/20 2026      Chief Complaint  Patient presents with  . Vaginal Bleeding   HPI Nicole Landry is a 35 y.o. [id number] at [redacted]w[redacted]d by LMP who presents to MAU with chief complaint of vaginal bleeding. This is a recurrent problem, onset at the end of December. Patient states she has experienced at least a little bit of bleeding every day since onset. She denies abdominal pain, dysuria, abdominal tenderness, fever or recent illness. Most recent sexual intercourse 2 days ago.  Patient receives care with [facility].  OB History    Gravida  7   Para  4   Term  4   Preterm  0   AB  2   Living  4     SAB  1   IAB  0   Ectopic  1   Multiple  0   Live Births  4           Past Medical History:  Diagnosis Date  . Blood transfusion without reported diagnosis    after ectopic  . Chronic kidney disease   . Ectopic pregnancy, tubal   . Gestational diabetes   . Gestational diabetes mellitus 2011   denies gestational DM with current pregnancy  . Ovarian cyst   . Status post vacuum-assisted vaginal delivery (5/17) 11/20/2015    Past Surgical History:  Procedure Laterality Date  . BILATERAL SALPINGECTOMY Right 02/08/2013   Procedure:  SALPINGECTOMY;  Surgeon: Antionette Char, MD;  Location: [facility] ORS;  Service: Gynecology;  Laterality: Right;  . CESAREAN SECTION    . CESAREAN SECTION N/A 08/18/2017   Procedure: Repeat CESAREAN SECTION;  Surgeon: Shea Evans, MD;  Location: [facility] BIRTHING SUITES;  Service: Obstetrics;  Laterality: N/A;  EDD: 08/24/17 Allergy: Vicodin  . CESAREAN SECTION N/A 04/12/2019   Procedure: Repeat CESAREAN SECTION;  Surgeon: Shea Evans, MD;  Location: [facility] LD ORS;  Service: Obstetrics;  Laterality: N/A;  EDD: 04/17/19 Allergy: Vicodin  . DILATION AND CURETTAGE OF UTERUS    . LAPAROSCOPY N/A 02/08/2013   Procedure:  LAPAROSCOPY OPERATIVE;  Surgeon: Antionette Char, MD;  Location: [facility] ORS;  Service: Gynecology;  Laterality: N/A;  . SALPINGECTOMY Right 02/2013   op note in epic    History reviewed. No pertinent family history.  Social History   Tobacco Use  . Smoking status: Never Smoker  . Smokeless tobacco: Never Used  Vaping Use  . Vaping Use: Never used  Substance Use Topics  . Alcohol use: No  . Drug use: No    Allergies:  Allergies  Allergen Reactions  . 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    . Vicodin [Hydrocodone-Acetaminophen] Other (See Comments)    Reaction:  Seizures; has tolerated acetaminophen and other narcotics in the past    Medications Prior to Admission  Medication Sig Dispense Refill Last Dose  . ferrous sulfate 325 (65 FE) MG tablet Take 325 mg by mouth daily with breakfast.   Past Month at Unknown time  . Prenatal Vit-Fe Fumarate-FA (PRENATAL MULTIVITAMIN) TABS tablet Take 1 tablet by mouth daily at 12 noon.   07/18/2020 at Unknown time  . acetaminophen (TYLENOL) 500 MG tablet Take 2 tablets (1,000 mg total) by mouth every 6 (six) hours as needed for mild pain (temperature > 101.5.).  30 tablet 0   . Cholecalciferol (VITAMIN D3) 50 MCG (2000 UT) TABS Take 2,000 Units by mouth daily.     Marland Kitchen ibuprofen (ADVIL) 800 MG tablet Take 1 tablet (800 mg total) by mouth every 6 (six) hours. 30 tablet 0   . iron polysaccharides (NIFEREX) 150 MG capsule Take 1 capsule (150 mg total) by mouth daily.     . magnesium oxide (MAG-OX) 400 (241.3 Mg) MG tablet Take 1 tablet (400 mg total) by mouth daily.     . simethicone (MYLICON) 80 MG chewable tablet Chew 1 tablet (80 mg total) by mouth 3 (three) times daily after meals. 30 tablet 0   . simethicone (MYLICON) 80 MG chewable tablet Chew 1 tablet (80 mg total) by mouth as needed for flatulence. 30 tablet 0   . vitamin C (ASCORBIC ACID) 500 MG tablet Take 500 mg by mouth daily.       Review  of Systems  Gastrointestinal: Negative for abdominal pain.  Genitourinary: Positive for vaginal bleeding. Negative for dysuria.  Musculoskeletal: Negative for back pain.  All other systems reviewed and are negative.  Physical Exam   Blood pressure 114/86, pulse 97, resp. rate 15, last menstrual period 06/07/2020, SpO2 100 %, unknown if currently breastfeeding.  Physical Exam Vitals and nursing note reviewed. Exam conducted with a chaperone present.  Constitutional:      Appearance: Normal appearance. She is not ill-appearing.  Cardiovascular:     Rate and Rhythm: Normal rate and regular rhythm.     Pulses: Normal pulses.     Heart sounds: Normal heart sounds.  Pulmonary:     Effort: Pulmonary effort is normal.     Breath sounds: Normal breath sounds.  Abdominal:     General: Bowel sounds are normal.     Tenderness: There is no abdominal tenderness. There is no right CVA tenderness or left CVA tenderness.  Genitourinary:    Comments: Deferred due to patient description of scant bleeding Skin:    Capillary Refill: Capillary refill takes less than 2 seconds.  Neurological:     Mental Status: She is alert and oriented to person, place, and time.  Psychiatric:        Mood and Affect: Mood normal.        Behavior: Behavior normal.        Thought Content: Thought content normal.        Judgment: Judgment normal.     MAU Course  Procedures  --Abnormal UA d/t contamination with vaginal bleeding. Treatment not indicated  Orders Placed This Encounter  Procedures  . Wet prep, genital    Standing Status:   Standing    Number of Occurrences:   1  . US OB LESS THAN 14 WEEKS WITH OB TRANSVAGINAL    No pain, scant bleeding but occurs daily.    Standing Status:   Standing    Number of Occurrences:   1    Order Specific Question:   Symptom/Reason for Exam    Answer:   Vaginal bleeding in pregnancy, first trimester [[id number]]  . Urinalysis, Routine w reflex microscopic    Standing  Status:   Standing    Number of Occurrences:   1  . CBC    Standing Status:   Standing    Number of Occurrences:   1  . hCG, quantitative, pregnancy    Standing Status:   Standing    Number of Occurrences:   1  . Pregnancy, urine POC  Standing Status:   Standing    Number of Occurrences:   1   Patient Vitals for the past 24 hrs:  BP Pulse Resp SpO2  07/19/20 2138 122/69 90 15 -  07/19/20 2027 114/86 97 - -  07/19/20 2017 136/89 (!) 116 15 100 %   Results for orders placed or performed during the hospital encounter of 07/19/20 (from the past 24 hour(s))  Urinalysis, Routine w reflex microscopic     Status: Abnormal   Collection Time: 07/19/20  8:05 PM  Result Value Ref Range   Color, Urine YELLOW YELLOW   APPearance CLOUDY (A) CLEAR   Specific Gravity, Urine 1.021 1.005 - 1.030   pH 6.0 5.0 - 8.0   Glucose, UA NEGATIVE NEGATIVE mg/dL   Hgb urine dipstick LARGE (A) NEGATIVE   Bilirubin Urine NEGATIVE NEGATIVE   Ketones, ur NEGATIVE NEGATIVE mg/dL   Protein, ur 30 (A) NEGATIVE mg/dL   Nitrite NEGATIVE NEGATIVE   Leukocytes,Ua LARGE (A) NEGATIVE   RBC / HPF 21-50 0 - 5 RBC/hpf   WBC, UA >50 (H) 0 - 5 WBC/hpf   Bacteria, UA RARE (A) NONE SEEN   Squamous Epithelial / LPF 11-20 0 - 5   Mucus PRESENT   Pregnancy, urine POC     Status: Abnormal   Collection Time: 07/19/20  8:10 PM  Result Value Ref Range   Preg Test, Ur POSITIVE (A) NEGATIVE  Wet prep, genital     Status: Abnormal   Collection Time: 07/19/20  8:35 PM   Specimen: Vaginal  Result Value Ref Range   Yeast Wet Prep HPF POC NONE SEEN NONE SEEN   Trich, Wet Prep NONE SEEN NONE SEEN   Clue Cells Wet Prep HPF POC NONE SEEN NONE SEEN   WBC, Wet Prep HPF POC MANY (A) NONE SEEN   Sperm NONE SEEN   CBC     Status: Abnormal   Collection Time: 07/19/20  8:43 PM  Result Value Ref Range   WBC 6.6 4.0 - 10.5 K/uL   RBC 3.82 (L) 3.87 - 5.11 MIL/uL   Hemoglobin 7.4 (L) 12.0 - 15.0 g/dL   HCT 16.126.1 (L) 09.636.0 - 04.546.0 %    MCV 68.3 (L) 80.0 - 100.0 fL   MCH 19.4 (L) 26.0 - 34.0 pg   MCHC 28.4 (L) 30.0 - 36.0 g/dL   RDW 40.920.5 (H) 81.111.5 - 91.415.5 %   Platelets 462 (H) 150 - 400 K/uL   nRBC 0.0 0.0 - 0.2 %   US OB LESS THAN 14 WEEKS WITH OB TRANSVAGINAL  Result Date: 07/19/2020 CLINICAL DATA:  Vaginal bleeding. EXAM: OBSTETRIC <14 WK US AND TRANSVAGINAL OB US TECHNIQUE: Both transabdominal and transvaginal ultrasound examinations were performed for complete evaluation of the gestation as well as the maternal uterus, adnexal regions, and pelvic cul-de-sac. Transvaginal technique was performed to assess early pregnancy. COMPARISON:  None. FINDINGS: Intrauterine gestational sac: Single Yolk sac:  Visualized. Embryo:  Visualized. Cardiac Activity: Visualized. Heart Rate: 100 bpm CRL:  3.0 mm   5 w   5 d                  US EDC: March 16, 2021 Subchorionic hemorrhage:  None visualized. Maternal uterus/adnexae: The bilateral ovaries are visualized. A right-sided corpus luteum cyst is seen. There is a trace amount of pelvic free fluid. IMPRESSION: Single, viable intrauterine pregnancy at approximately 5 weeks and 5 days gestation by ultrasound evaluation. Electronically Signed   By: Waylan Rocherhaddeus  [place] M.D.   On: 07/19/2020 21:21   Assessment and Plan  --35 y.o. [id number] with IUP at 104w0d  --No subchorionic hematoma visualized --Pelvic rest advised in setting of bleeding of unknown origin  --Blood type O POS --Discharge home in stable condition  Calvert Cantor, CNM 07/19/2020, 9:42 PM

## 2020-07-19 NOTE — Discharge Instructions (Signed)
https://www.cdc.gov/pregnancy/infections.html">  First Trimester of Pregnancy  The first trimester of pregnancy starts on the first day of your last menstrual period until the end of week 12. This is also called months 1 through 3 of pregnancy. Body changes during your first trimester Your body goes through many changes during pregnancy. The changes usually return to normal after your baby is born. Physical changes  You may gain or lose weight.  Your breasts may grow larger and hurt. The area around your nipples may get darker.  Dark spots or blotches may develop on your face.  You may have changes in your hair. Health changes  You may feel like you might vomit (nauseous), and you may vomit.  You may have heartburn.  You may have headaches.  You may have trouble pooping (constipation).  Your gums may bleed. Other changes  You may get tired easily.  You may pee (urinate) more often.  Your menstrual periods will stop.  You may not feel hungry.  You may want to eat certain kinds of food.  You may have changes in your emotions from day to day.  You may have more dreams. Follow these instructions at home: Medicines  Take over-the-counter and prescription medicines only as told by your doctor. Some medicines are not safe during pregnancy.  Take a prenatal vitamin that contains at least 600 micrograms (mcg) of folic acid. Eating and drinking  Eat healthy meals that include: ? Fresh fruits and vegetables. ? Whole grains. ? Good sources of protein, such as meat, eggs, or tofu. ? Low-fat dairy products.  Avoid raw meat and unpasteurized juice, milk, and cheese.  If you feel like you may vomit, or you vomit: ? Eat 4 or 5 small meals a day instead of 3 large meals. ? Try eating a few soda crackers. ? Drink liquids between meals instead of during meals.  You may need to take these actions to prevent or treat trouble pooping: ? Drink enough fluids to keep your pee  (urine) pale yellow. ? Eat foods that are high in fiber. These include beans, whole grains, and fresh fruits and vegetables. ? Limit foods that are high in fat and sugar. These include fried or sweet foods. Activity  Exercise only as told by your doctor. Most people can do their usual exercise routine during pregnancy.  Stop exercising if you have cramps or pain in your lower belly (abdomen) or low back.  Do not exercise if it is too hot or too humid, or if you are in a place of great height (high altitude).  Avoid heavy lifting.  If you choose to, you may have sex unless your doctor tells you not to. Relieving pain and discomfort  Wear a good support bra if your breasts are sore.  Rest with your legs raised (elevated) if you have leg cramps or low back pain.  If you have bulging veins (varicose veins) in your legs: ? Wear support hose as told by your doctor. ? Raise your feet for 15 minutes, 3-4 times a day. ? Limit salt in your food. Safety  Wear your seat belt at all times when you are in a car.  Talk with your doctor if someone is hurting you or yelling at you.  Talk with your doctor if you are feeling sad or have thoughts of hurting yourself. Lifestyle  Do not use hot tubs, steam rooms, or saunas.  Do not douche. Do not use tampons or scented sanitary pads.  Do not   use herbal medicines, illegal drugs, or medicines that are not approved by your doctor. Do not drink alcohol.  Do not smoke or use any products that contain nicotine or tobacco. If you need help quitting, ask your doctor.  Avoid cat litter boxes and soil that is used by cats. These carry germs that can cause harm to the baby and can cause a loss of your baby by miscarriage or stillbirth. General instructions  Keep all follow-up visits. This is important.  Ask for help if you need counseling or if you need help with nutrition. Your doctor can give you advice or tell you where to go for help.  Visit your  dentist. At home, brush your teeth with a soft toothbrush. Floss gently.  Write down your questions. Take them to your prenatal visits. Where to find more information  American Pregnancy Association: americanpregnancy.org  American College of Obstetricians and Gynecologists: www.acog.org  Office on Women's Health: womenshealth.gov/pregnancy Contact a doctor if:  You are dizzy.  You have a fever.  You have mild cramps or pressure in your lower belly.  You have a nagging pain in your belly area.  You continue to feel like you may vomit, you vomit, or you have watery poop (diarrhea) for 24 hours or longer.  You have a bad-smelling fluid coming from your vagina.  You have pain when you pee.  You are exposed to a disease that spreads from person to person, such as chickenpox, measles, Zika virus, HIV, or hepatitis. Get help right away if:  You have spotting or bleeding from your vagina.  You have very bad belly cramping or pain.  You have shortness of breath or chest pain.  You have any kind of injury, such as from a fall or a car crash.  You have new or increased pain, swelling, or redness in an arm or leg. Summary  The first trimester of pregnancy starts on the first day of your last menstrual period until the end of week 12 (months 1 through 3).  Eat 4 or 5 small meals a day instead of 3 large meals.  Do not smoke or use any products that contain nicotine or tobacco. If you need help quitting, ask your doctor.  Keep all follow-up visits. This information is not intended to replace advice given to you by your health care provider. Make sure you discuss any questions you have with your health care provider. Document Revised: 11/29/2019 Document Reviewed: 10/05/2019 Elsevier Patient Education  2021 [employer]vier Inc.  

## 2020-07-19 NOTE — MAU Note (Signed)
.  Nicole Landry is a 35 y.o. at [redacted]w[redacted]d here in MAU reporting: vaginal bleeding that started in late December. She states that she continues to bleed with dark brown blood. Last intercourse was 07/17/20. Pain score: 0 LMP: 06/07/20  Lab orders placed from triage:ua

## 2020-07-20 ENCOUNTER — Other Ambulatory Visit: Payer: Self-pay

## 2020-07-20 ENCOUNTER — Inpatient Hospital Stay (HOSPITAL_COMMUNITY)
Admission: AD | Admit: 2020-07-20 | Discharge: 2020-07-20 | Disposition: A | Discharge status: Home / Self Care | Payer: Medicaid Other | Attending: Obstetrics & Gynecology | Admitting: Obstetrics & Gynecology

## 2020-07-20 DIAGNOSIS — Z3A01 Less than 8 weeks gestation of pregnancy: Secondary | ICD-10-CM | POA: Insufficient documentation

## 2020-07-20 DIAGNOSIS — D649 Anemia, unspecified: Secondary | ICD-10-CM | POA: Insufficient documentation

## 2020-07-20 DIAGNOSIS — O09292 Supervision of pregnancy with other poor reproductive or obstetric history, second trimester: Secondary | ICD-10-CM | POA: Diagnosis not present

## 2020-07-20 DIAGNOSIS — R8290 Unspecified abnormal findings in urine: Secondary | ICD-10-CM

## 2020-07-20 DIAGNOSIS — O26891 Other specified pregnancy related conditions, first trimester: Secondary | ICD-10-CM | POA: Insufficient documentation

## 2020-07-20 DIAGNOSIS — O99891 Other specified diseases and conditions complicating pregnancy: Secondary | ICD-10-CM

## 2020-07-20 DIAGNOSIS — O99011 Anemia complicating pregnancy, first trimester: Secondary | ICD-10-CM | POA: Diagnosis not present

## 2020-07-20 MED ORDER — FERROUS SULFATE 325 (65 FE) MG PO TABS: 325.0000 mg | ORAL_TABLET | ORAL | 2 refills | Status: DC

## 2020-07-20 MED ORDER — CEPHALEXIN 500 MG PO CAPS: 500.0000 mg | ORAL_CAPSULE | Freq: Four times a day (QID) | ORAL | 0 refills | Status: AC

## 2020-07-20 NOTE — MAU Note (Signed)
Pt reports she was here last pm and was told she did not need to wait for results and when she looked on mychart it indicated she had a UTI. Reports some back pain.

## 2020-07-20 NOTE — Discharge Instructions (Signed)
Take keflex every 6 hours for 5 days. We will call you if negative urine culture. We recommend you start iron supplement every other day given anemia. Your hemoglobin level was 7.4 on 07/20/20. Please call Dr. Juliene Pina to establish prenatal care.  Pregnancy and Urinary Tract Infection  A urinary tract infection (UTI) is an infection of any part of the urinary tract. This includes the kidneys, the tubes that connect your kidneys to your bladder (ureters), the bladder, and the tube that carries urine out of your body (urethra). These organs make, store, and get rid of urine in the body. Your health care provider may use other names to describe the infection. An upper UTI affects the ureters and kidneys (pyelonephritis). A lower UTI affects the bladder (cystitis) and urethra (urethritis). Most urinary tract infections are caused by bacteria in your genital area, around the entrance to your urinary tract (urethra). These bacteria grow and cause irritation and inflammation of your urinary tract. You are more likely to develop a UTI during pregnancy because the physical and hormonal changes your body goes through can make it easier for bacteria to get into your urinary tract. Your growing baby also puts pressure on your bladder and can affect urine flow. It is important to recognize and treat UTIs in pregnancy because of the risk of serious complications for both you and your baby. How does this affect me? Symptoms of a UTI include:  Needing to urinate right away (urgently).  Frequent urination or passing small amounts of urine frequently.  Pain or burning with urination.  Blood in the urine.  Urine that smells bad or unusual.  Trouble urinating.  Cloudy urine.  Pain in the abdomen or lower back.  Vaginal discharge. You may also have:  Vomiting or a decreased appetite.  Confusion.  Irritability or tiredness.  A fever.  Diarrhea. How does this affect my baby? An untreated UTI during  pregnancy could lead to a kidney infection or a systemic infection, which can cause health problems that could affect your baby. Possible complications of an untreated UTI include:  Giving birth to your baby before 37 weeks of pregnancy (premature).  Having a baby with a low birth weight.  Developing high blood pressure during pregnancy (preeclampsia).  Having a low hemoglobin level (anemia). What can I do to lower my risk? To prevent a UTI:  Go to the bathroom as soon as you feel the need. Do not hold urine for long periods of time.  Always wipe from front to back, especially after a bowel movement. Use each tissue one time when you wipe.  Empty your bladder after sex.  Keep your genital area dry.  Drink 6-10 glasses of water each day.  Do not douche or use deodorant sprays. How is this treated? Treatment for this condition may include:  Antibiotic medicines that are safe to take during pregnancy.  Other medicines to treat less common causes of UTI. Follow these instructions at home:  If you were prescribed an antibiotic medicine, take it as told by your health care provider. Do not stop using the antibiotic even if you start to feel better.  Keep all follow-up visits as told by your health care provider. This is important. Contact a health care provider if:  Your symptoms do not improve or they get worse.  You have abnormal vaginal discharge. Get help right away if you:  Have a fever.  Have nausea and vomiting.  Have back or side pain.  Feel contractions  in your uterus.  Have lower belly pain.  Have a gush of fluid from your vagina.  Have blood in your urine. Summary  A urinary tract infection (UTI) is an infection of any part of the urinary tract, which includes the kidneys, ureters, bladder, and urethra.  Most urinary tract infections are caused by bacteria in your genital area, around the entrance to your urinary tract (urethra).  You are more likely  to develop a UTI during pregnancy.  If you were prescribed an antibiotic medicine, take it as told by your health care provider. Do not stop using the antibiotic even if you start to feel better. This information is not intended to replace advice given to you by your health care provider. Make sure you discuss any questions you have with your health care provider. Document Revised: 10/14/2018 Document Reviewed: 05/26/2018 Elsevier Patient Education  2021 [employer].

## 2020-07-20 NOTE — MAU Provider Note (Signed)
Chief Complaint: Follow-up  SUBJECTIVE HPI: Nicole Landry is a 35 y.o. [id number] at [redacted]w[redacted]d by 5 week ultrasound who presents to maternity admissions reporting concern of abnormal urinalysis and left-sided flank discomfort. Pt was initially seen in MAU on 07/19/20 for concern of vaginal bleeding. She was found to have an IUP with +FHTs. Labs on 1/14 notable for Hgb 7.4 and UA notable for large Hgb, 30 protein, large LE, >50 WBC, rare bacteria and 11-20 squamous epithelial cells. Per nursing, pt was unable to wait for lab results at MAU visit on 1/14. She presents today given that she saw her abnormal UA result and felt she needed antibiotics given her corresponding left-sided flank pain. She denies dysuria and increased urinary frequency but states prior UTIs have not presented with these symptoms. No additional vaginal bleeding since MAU visit on 1/14. She denies vaginal bleeding, vaginal itching/burning, h/a, dizziness, n/v, or fever/chills. Pt has not yet established prenatal care.  Past Medical History:  Diagnosis Date  . Blood transfusion without reported diagnosis    after ectopic  . Chronic kidney disease   . Ectopic pregnancy, tubal   . Gestational diabetes   . Gestational diabetes mellitus 2011   denies gestational DM with current pregnancy  . Ovarian cyst   . Status post vacuum-assisted vaginal delivery (5/17) 11/20/2015   Past Surgical History:  Procedure Laterality Date  . BILATERAL SALPINGECTOMY Right 02/08/2013   Procedure:  SALPINGECTOMY;  Surgeon: Antionette Char, MD;  Location: [facility];  Service: Gynecology;  Laterality: Right;  . CESAREAN SECTION    . CESAREAN SECTION N/A 08/18/2017   Procedure: Repeat CESAREAN SECTION;  Surgeon: Shea Evans, MD;  Location: [facility] SU[facility];  Service: Obstetrics;  Laterality: N/A;  EDD: 08/24/17 Allergy: Vicodin  . CESAREAN SECTION N/A 04/12/2019   Procedure: Repeat CESAREAN SECTION;  Surgeon: Shea Evans, MD;  Location: [facility] OR[facility];   Service: Obstetrics;  Laterality: N/A;  EDD: 04/17/19 Allergy: Vicodin  . DILATION AND CURETTAGE OF UTERUS    . LAPAROSCOPY N/A 02/08/2013   Procedure: LAPAROSCOPY OPERATIVE;  Surgeon: Antionette Char, MD;  Location: [facility];  Service: Gynecology;  Laterality: N/A;  . SALPINGECTOMY Right 02/2013   op note in epic   Social History   Socioeconomic History  . Marital status: Married    Spouse name: Not on file  . Number of children: Not on file  . Years of education: Not on file  . Highest education level: Not on file  Occupational History  . Not on file  Tobacco Use  . Smoking status: Never Smoker  . Smokeless tobacco: Never Used  Vaping Use  . Vaping Use: Never used  Substance and Sexual Activity  . Alcohol use: No  . Drug use: No  . Sexual activity: Yes    Birth control/protection: None  Other Topics Concern  . Not on file  Social History Narrative   ** Merged History Encounter **       Social Determinants of Health   Financial Resource Strain: Not on file  Food Insecurity: Not on file  Transportation Needs: Not on file  Physical Activity: Not on file  Stress: Not on file  Social Connections: Not on file  Intimate Partner Violence: Not on file   No current facility-administered medications on file prior to encounter.   No current outpatient medications on file prior to encounter.   Allergies  Allergen Reactions  . 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    . Vicodin [Hydrocodone-Acetaminophen] Other (See Comments)    Reaction:  Seizures; has tolerated acetaminophen and other narcotics in the past    ROS:  Review of Systems  Constitutional: Negative for chills, fatigue and fever.  HENT: Negative for congestion and sore throat.   Eyes: Negative for photophobia.  Respiratory: Negative for cough and shortness of breath.   Cardiovascular: Negative for chest pain.  Gastrointestinal: Negative for abdominal pain,  nausea and vomiting.  Genitourinary: Negative for dysuria, frequency, urgency, vaginal bleeding, vaginal discharge and vaginal pain.  Musculoskeletal: Positive for back pain.  Neurological: Negative for headaches.   I have reviewed patient's Past Medical Hx, Surgical Hx, Family Hx, Social Hx, medications and allergies.   Physical Exam   Patient Vitals for the past 24 hrs:  BP Temp Pulse Resp SpO2  07/20/20 1912 (!) 141/76 98.6 F (37 C) 93 16 100 %   Constitutional: Well-developed, well-nourished female in no acute distress. Sitting comfortably in chair. Cardiovascular: normal rate Respiratory: normal effort GI: Abd soft, non-tender.  MS: normal ROM Neurologic: Alert and oriented x 4.  GU: deferred  LAB RESULTS No results found for this or any previous visit (from the past 24 hour(s)).     IMAGING US OB LESS THAN 14 WEEKS WITH OB TRANSVAGINAL  Result Date: 07/19/2020 CLINICAL DATA:  Vaginal bleeding. EXAM: OBSTETRIC <14 WK [place] AND TRANSVAGINAL OB US TECHNIQUE: Both transabdominal and transvaginal ultrasound examinations were performed for complete evaluation of the gestation as well as the maternal uterus, adnexal regions, and pelvic cul-de-sac. Transvaginal technique was performed to assess early pregnancy. COMPARISON:  None. FINDINGS: Intrauterine gestational sac: Single Yolk sac:  Visualized. Embryo:  Visualized. Cardiac Activity: Visualized. Heart Rate: 100 bpm CRL:  3.0 mm   5 w   5 d                  [place] EDC: March 16, 2021 Subchorionic hemorrhage:  None visualized. Maternal uterus/adnexae: The bilateral ovaries are visualized. A right-sided corpus luteum cyst is seen. There is a trace amount of pelvic free fluid. IMPRESSION: Single, viable intrauterine pregnancy at approximately 5 weeks and 5 days gestation by ultrasound evaluation. Electronically Signed   By: Aram Candela M.D.   On: 07/19/2020 21:21    MAU Management/MDM: Orders Placed This Encounter  Procedures  .  OB Urine Culture  . Discharge patient Discharge disposition: 01-Home or Self Care; Discharge patient date: 07/20/2020    Meds ordered this encounter  Medications  . ferrous sulfate 325 (65 FE) MG tablet    Sig: Take 1 tablet (325 mg total) by mouth every other day.    Dispense:  60 tablet    Refill:  2  . cephALEXin (KEFLEX) 500 MG capsule    Sig: Take 1 capsule (500 mg total) by mouth 4 (four) times daily for 5 days.    Dispense:  20 capsule    Refill:  0    Treatments in MAU included: none. Pt discharged with strict return precautions for worsening urinary symptoms, fever, urinary retention, recurrent vaginal bleeding, severe abdominal pain, or other concerns.  ASSESSMENT Nicole Landry is a 35 y.o. [phone] at [redacted]w[redacted]d by 5 week ultrasound who presents to maternity admissions reporting concern of abnormal urinalysis and left-sided flank discomfort.  1. Anemia during pregnancy in first trimester: Pt reports history of chronic anemia with need for IV iron in the past. Recently seen in MAU on 1/14, at which time Hgb  7.4. Pt is currently asymptomatic today. No recurrent vaginal bleeding since last MAU visit. No current iron supplementation except for prenatal vitamin. -script for po iron supplement and encouraged continued daily administration of prenatal vitamin -provided strict return precautions for recurrent vaginal bleeding, lightheadedness, dizziness, extreme fatigue or other concerns  2. Abnormal urine findings: Pt with UA on 1/14 remarkable for large LE, >50 WBC and rare bacteria. Given 11-20 squamous epithelial cells, UA is not convincing for diagnosis of UTI. However, given current pregnancy and left-sided flank pain and concern of pt, discussed option to start empiric UTI treatment with keflex. Discussed recommendation to discontinue antibiotic if urine culture obtained in MAU today is negative. -script for keflex 500mg  QID x5d (plan to discontinue if negative Urine Culture--recollected  clean catch today) -emphasized importance of good hydration with plenty of water and avoidance of sugary drinks -recommended use of tylenol (NOT NSAIDs) as needed for discomfort -return precautions for worsening symptoms as noted above   PLAN Discharge home with return precautions as noted above. Emphasized need to establish care with prenatal provider (pt to call on 1/17 to schedule appt) Allergies as of 07/20/2020      Reactions   Food Anaphylaxis, Other (See Comments)   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     Vicodin [hydrocodone-acetaminophen] Other (See Comments)   Reaction:  Seizures; has tolerated acetaminophen and other narcotics in the past      Medication List    STOP taking these medications   ibuprofen 800 MG tablet Commonly known as: ADVIL   iron polysaccharides 150 MG capsule Commonly known as: NIFEREX   magnesium oxide 400 (241.3 Mg) MG tablet Commonly known as: MAG-OX   simethicone 80 MG chewable tablet Commonly known as: MYLICON   vitamin C 500 MG tablet Commonly known as: ASCORBIC ACID   Vitamin D3 50 MCG (2000 UT) Tabs     TAKE these medications   cephALEXin 500 MG capsule Commonly known as: KEFLEX Take 1 capsule (500 mg total) by mouth 4 (four) times daily for 5 days.   ferrous sulfate 325 (65 FE) MG tablet Take 1 tablet (325 mg total) by mouth every other day.       07/22/2020, MD OB Fellow, [facility] 07/21/2020 12:21 PM

## 2020-07-22 LAB — CULTURE, OB URINE

## 2020-07-22 LAB — GC/CHLAMYDIA PROBE AMP (~~LOC~~) NOT AT ARMC
Chlamydia: NEGATIVE
Comment: NEGATIVE
Comment: NORMAL
Neisseria Gonorrhea: NEGATIVE

## 2020-08-01 ENCOUNTER — Inpatient Hospital Stay (HOSPITAL_COMMUNITY)
Admission: AD | Admit: 2020-08-01 | Discharge: 2020-08-02 | Disposition: A | Discharge status: Home / Self Care | Payer: Medicaid Other | Attending: Obstetrics and Gynecology | Admitting: Obstetrics and Gynecology

## 2020-08-01 ENCOUNTER — Other Ambulatory Visit: Payer: Self-pay

## 2020-08-01 DIAGNOSIS — O99011 Anemia complicating pregnancy, first trimester: Secondary | ICD-10-CM | POA: Insufficient documentation

## 2020-08-01 DIAGNOSIS — R42 Dizziness and giddiness: Secondary | ICD-10-CM

## 2020-08-01 DIAGNOSIS — Z79899 Other long term (current) drug therapy: Secondary | ICD-10-CM | POA: Insufficient documentation

## 2020-08-01 DIAGNOSIS — O26891 Other specified pregnancy related conditions, first trimester: Secondary | ICD-10-CM | POA: Insufficient documentation

## 2020-08-01 DIAGNOSIS — Z3A08 8 weeks gestation of pregnancy: Secondary | ICD-10-CM | POA: Insufficient documentation

## 2020-08-01 DIAGNOSIS — D649 Anemia, unspecified: Secondary | ICD-10-CM

## 2020-08-01 DIAGNOSIS — O09291 Supervision of pregnancy with other poor reproductive or obstetric history, first trimester: Secondary | ICD-10-CM | POA: Insufficient documentation

## 2020-08-01 DIAGNOSIS — O09521 Supervision of elderly multigravida, first trimester: Secondary | ICD-10-CM | POA: Insufficient documentation

## 2020-08-02 ENCOUNTER — Encounter (HOSPITAL_COMMUNITY): Payer: Self-pay | Admitting: Obstetrics and Gynecology

## 2020-08-02 DIAGNOSIS — O26891 Other specified pregnancy related conditions, first trimester: Secondary | ICD-10-CM | POA: Diagnosis not present

## 2020-08-02 DIAGNOSIS — I951 Orthostatic hypotension: Secondary | ICD-10-CM

## 2020-08-02 DIAGNOSIS — Z3A08 8 weeks gestation of pregnancy: Secondary | ICD-10-CM | POA: Diagnosis not present

## 2020-08-02 DIAGNOSIS — O09291 Supervision of pregnancy with other poor reproductive or obstetric history, first trimester: Secondary | ICD-10-CM | POA: Diagnosis not present

## 2020-08-02 DIAGNOSIS — O99011 Anemia complicating pregnancy, first trimester: Secondary | ICD-10-CM | POA: Diagnosis not present

## 2020-08-02 DIAGNOSIS — D649 Anemia, unspecified: Secondary | ICD-10-CM | POA: Diagnosis not present

## 2020-08-02 DIAGNOSIS — R42 Dizziness and giddiness: Secondary | ICD-10-CM

## 2020-08-02 DIAGNOSIS — O99891 Other specified diseases and conditions complicating pregnancy: Secondary | ICD-10-CM | POA: Diagnosis not present

## 2020-08-02 DIAGNOSIS — O09521 Supervision of elderly multigravida, first trimester: Secondary | ICD-10-CM | POA: Diagnosis not present

## 2020-08-02 DIAGNOSIS — Z79899 Other long term (current) drug therapy: Secondary | ICD-10-CM | POA: Diagnosis not present

## 2020-08-02 DIAGNOSIS — O99411 Diseases of the circulatory system complicating pregnancy, first trimester: Secondary | ICD-10-CM

## 2020-08-02 MED ORDER — ONDANSETRON 4 MG PO TBDP
8.0000 mg | ORAL_TABLET | Freq: Once | ORAL | Status: AC
  Administered 2020-08-02: 8 mg via ORAL
  Filled 2020-08-02: qty 2

## 2020-08-02 MED ORDER — MECLIZINE HCL 25 MG PO TABS
25.0000 mg | ORAL_TABLET | Freq: Once | ORAL | Status: AC
  Administered 2020-08-02: 25 mg via ORAL
  Filled 2020-08-02: qty 1

## 2020-08-02 MED ORDER — ONDANSETRON 4 MG PO TBDP
4.0000 mg | ORAL_TABLET | Freq: Once | ORAL | Status: DC
  Filled 2020-08-02: qty 1

## 2020-08-02 MED ORDER — ONDANSETRON 4 MG PO TBDP: 4.0000 mg | ORAL_TABLET | Freq: Four times a day (QID) | ORAL | 0 refills | Status: DC | PRN

## 2020-08-02 MED ORDER — LACTATED RINGERS IV SOLN: Freq: Once | INTRAVENOUS | Status: AC

## 2020-08-02 MED ORDER — MECLIZINE HCL 25 MG PO TABS: 25.0000 mg | ORAL_TABLET | Freq: Three times a day (TID) | ORAL | 0 refills | Status: DC | PRN

## 2020-08-02 MED ORDER — SODIUM CHLORIDE 0.9 % IV SOLN
510.0000 mg | Freq: Once | INTRAVENOUS | Status: AC
  Administered 2020-08-02: 510 mg via INTRAVENOUS
  Filled 2020-08-02: qty 17

## 2020-08-02 MED ORDER — SODIUM CHLORIDE 0.9 % IV SOLN: Freq: Once | INTRAVENOUS | Status: AC

## 2020-08-02 NOTE — Discharge Instructions (Signed)
 [employer] medicine (25th ed., pp. 1059-1068). [place], [place]: Elsevier.">  Anemia  Anemia is a condition in which there is not enough red blood cells or hemoglobin in the blood. Hemoglobin is a substance in red blood cells that carries oxygen. When you do not have enough red blood cells or hemoglobin (are anemic), your body cannot get enough oxygen and your organs may not work properly. As a result, you may feel very tired or have other problems. What are the causes? Common causes of anemia include:  Excessive bleeding. Anemia can be caused by excessive bleeding inside or outside the body, including bleeding from the intestines or from heavy menstrual periods in females.  Poor nutrition.  Long-lasting (chronic) kidney, thyroid, and liver disease.  Bone marrow disorders, spleen problems, and blood disorders.  Cancer and treatments for cancer.  HIV (human immunodeficiency virus) and AIDS (acquired immunodeficiency syndrome).  Infections, medicines, and autoimmune disorders that destroy red blood cells. What are the signs or symptoms? Symptoms of this condition include:  Minor weakness.  Dizziness.  Headache, or difficulties concentrating and sleeping.  Heartbeats that feel irregular or faster than normal (palpitations).  Shortness of breath, especially with exercise.  Pale skin, lips, and nails, or cold hands and feet.  Indigestion and nausea. Symptoms may occur suddenly or develop slowly. If your anemia is mild, you may not have symptoms. How is this diagnosed? This condition is diagnosed based on blood tests, your medical history, and a physical exam. In some cases, a test may be needed in which cells are removed from the soft tissue inside of a bone and looked at under a microscope (bone marrow biopsy). Your health care provider may also check your stool (feces) for blood and may do additional testing to look for the cause of your bleeding. Other tests may  include:  Imaging tests, such as a CT scan or MRI.  A procedure to see inside your esophagus and stomach (endoscopy).  A procedure to see inside your colon and rectum (colonoscopy). How is this treated? Treatment for this condition depends on the cause. If you continue to lose a lot of blood, you may need to be treated at a hospital. Treatment may include:  Taking supplements of iron, vitamin B12, or folic acid.  Taking a hormone medicine (erythropoietin) that can help to stimulate red blood cell growth.  Having a blood transfusion. This may be needed if you lose a lot of blood.  Making changes to your diet.  Having surgery to remove your spleen. Follow these instructions at home:  Take over-the-counter and prescription medicines only as told by your health care provider.  Take supplements only as told by your health care provider.  Follow any diet instructions that you were given by your health care provider.  Keep all follow-up visits as told by your health care provider. This is important. Contact a health care provider if:  You develop new bleeding anywhere in the body. Get help right away if:  You are very weak.  You are short of breath.  You have pain in your abdomen or chest.  You are dizzy or feel faint.  You have trouble concentrating.  You have bloody stools, black stools, or tarry stools.  You vomit repeatedly or you vomit up blood. These symptoms may represent a serious problem that is an emergency. Do not wait to see if the symptoms will go away. Get medical help right away. Call your local emergency services (911 in the [place] Do   not drive yourself to the hospital. Summary  Anemia is a condition in which you do not have enough red blood cells or enough of a substance in your red blood cells that carries oxygen (hemoglobin).  Symptoms may occur suddenly or develop slowly.  If your anemia is mild, you may not have symptoms.  This condition is  diagnosed with blood tests, a medical history, and a physical exam. Other tests may be needed.  Treatment for this condition depends on the cause of the anemia. This information is not intended to replace advice given to you by your health care provider. Make sure you discuss any questions you have with your health care provider. Document Revised: 05/30/2019 Document Reviewed: 05/30/2019 Elsevier Patient Education  2021 [place]. Benign Positional Vertigo Vertigo is the feeling that you or your surroundings are moving when they are not. Benign positional vertigo is the most common form of vertigo. This is usually a harmless condition (benign). This condition is positional. This means that symptoms are triggered by certain movements and positions. This condition can be dangerous if it occurs while you are doing something that could cause harm to you or others. This includes activities such as driving or operating machinery. What are the causes? The inner ear has fluid-filled canals that help your brain sense movement and balance. When the fluid moves, the brain receives messages about your body's position. With benign positional vertigo, crystals in the inner ear break free and disturb the inner ear area. This causes your brain to receive confusing messages about your body's position. What increases the risk? You are more likely to develop this condition if:  You are a woman.  You are 24 years of age or older.  You have recently had a head injury.  You have an inner ear disease. What are the signs or symptoms? Symptoms of this condition usually happen when you move your head or your eyes in different directions. Symptoms may start suddenly, and usually last for less than a minute. They include:  Loss of balance and falling.  Feeling like you are spinning or moving.  Feeling like your surroundings are spinning or moving.  Nausea and vomiting.  Blurred  vision.  Dizziness.  Involuntary eye movement (nystagmus). Symptoms can be mild and cause only minor problems, or they can be severe and interfere with daily life. Episodes of benign positional vertigo may return (recur) over time. Symptoms may improve over time. How is this diagnosed? This condition may be diagnosed based on:  Your medical history.  Physical exam of the head, neck, and ears.  Positional tests to check for or stimulate vertigo. You may be asked to turn your head and change positions, such as going from sitting to lying down. A health care provider will watch for symptoms of vertigo. You may be referred to a health care provider who specializes in ear, nose, and throat problems (ENT, or otolaryngologist) or a provider who specializes in disorders of the nervous system (neurologist). How is this treated? This condition may be treated in a session in which your health care provider moves your head in specific positions to help the displaced crystals in your inner ear move. Treatment for this condition may take several sessions. Surgery may be needed in severe cases, but this is rare. In some cases, benign positional vertigo may resolve on its own in 2-4 weeks.   Follow these instructions at home: Safety  Move slowly. Avoid sudden body or head movements or certain  positions, as told by your health care provider.  Avoid driving until your health care provider says it is safe for you to do so.  Avoid operating heavy machinery until your health care provider says it is safe for you to do so.  Avoid doing any tasks that would be dangerous to you or others if vertigo occurs.  If you have trouble walking or keeping your balance, try using a cane for stability. If you feel dizzy or unstable, sit down right away.  Return to your normal activities as told by your health care provider. Ask your health care provider what activities are safe for you. General instructions  Take  over-the-counter and prescription medicines only as told by your health care provider.  Drink enough fluid to keep your urine pale yellow.  Keep all follow-up visits as told by your health care provider. This is important. Contact a health care provider if:  You have a fever.  Your condition gets worse or you develop new symptoms.  Your family or friends notice any behavioral changes.  You have nausea or vomiting that gets worse.  You have numbness or a prickling and tingling sensation. Get help right away if you:  Have difficulty speaking or moving.  Are always dizzy.  Faint.  Develop severe headaches.  Have weakness in your legs or arms.  Have changes in your hearing or vision.  Develop a stiff neck.  Develop sensitivity to light. Summary  Vertigo is the feeling that you or your surroundings are moving when they are not. Benign positional vertigo is the most common form of vertigo.  This condition is caused by crystals in the inner ear that become displaced. This causes a disturbance in an area of the inner ear that helps your brain sense movement and balance.  Symptoms include loss of balance and falling, feeling that you or your surroundings are moving, nausea and vomiting, and blurred vision.  This condition can be diagnosed based on symptoms, a physical exam, and positional tests.  Follow safety instructions as told by your health care provider. You will also be told when to contact your health care provider in case of problems. This information is not intended to replace advice given to you by your health care provider. Make sure you discuss any questions you have with your health care provider. Document Revised: 05/16/2019 Document Reviewed: 12/01/2017 Elsevier Patient Education  2021 [place]. Modified Epley maneuver for self-treatment of benign positional vertigo (left)  This maneuver should be carried out three times a day. Repeat this daily until you  are free from positional vertigo for 24 hours.

## 2020-08-02 NOTE — MAU Note (Signed)
I was sitting in bed and suddenly got very dizzy like the room was spinning and continues to do so. Vomited x 1 due to dizziness but I have been have n/v related to pregnancy. Denies VB or vag d/c. No pain. Dizzines is better with eyes closed

## 2020-08-02 NOTE — MAU Provider Note (Signed)
Chief Complaint: Emesis During Pregnancy and Dizziness   Event Date/Time   First Provider Initiated Contact with Patient 08/02/20 0045        SUBJECTIVE HPI: Nicole Landry is a 35 y.o. [id number] at [redacted]w[redacted]d by LMP who presents to maternity admissions reporting sudden onset of dizziness and sensation that the room is spinning. It caused her to vomit once.  Improves with closing eyes.  Has known severe anemia, on oral iron therapy   Has had to get [facility] in past for anemia related to bleeding. Tolerated well. . She denies vaginal bleeding, vaginal itching/burning, urinary symptoms, h/a, n/v, or fever/chills.    Dizziness This is a new problem. The current episode started today. The problem occurs constantly. The problem has been unchanged. Associated symptoms include nausea, vertigo and vomiting. Pertinent negatives include no abdominal pain, chills, fatigue, fever, headaches or myalgias. The symptoms are aggravated by standing (movement). She has tried nothing for the symptoms.   RN Note: I was sitting in bed and suddenly got very dizzy like the room was spinning and continues to do so. Vomited x 1 due to dizziness but I have been have n/v related to pregnancy. Denies VB or vag d/c. No pain. Dizzines is better with eyes closed  Past Medical History:  Diagnosis Date  . Blood transfusion without reported diagnosis    after ectopic  . Chronic kidney disease   . Ectopic pregnancy, tubal   . Gestational diabetes   . Gestational diabetes mellitus 2011   denies gestational DM with current pregnancy  . Ovarian cyst   . Status post vacuum-assisted vaginal delivery (5/17) 11/20/2015   Past Surgical History:  Procedure Laterality Date  . BILATERAL SALPINGECTOMY Right 02/08/2013   Procedure:  SALPINGECTOMY;  Surgeon: Antionette Char, MD;  Location: [facility] ORS;  Service: Gynecology;  Laterality: Right;  . CESAREAN SECTION    . CESAREAN SECTION N/A 08/18/2017   Procedure: Repeat CESAREAN SECTION;   Surgeon: Shea Evans, MD;  Location: [facility] BIRTHING SUITES;  Service: Obstetrics;  Laterality: N/A;  EDD: 08/24/17 Allergy: Vicodin  . CESAREAN SECTION N/A 04/12/2019   Procedure: Repeat CESAREAN SECTION;  Surgeon: Shea Evans, MD;  Location: [facility] LD ORS;  Service: Obstetrics;  Laterality: N/A;  EDD: 04/17/19 Allergy: Vicodin  . DILATION AND CURETTAGE OF UTERUS    . LAPAROSCOPY N/A 02/08/2013   Procedure: LAPAROSCOPY OPERATIVE;  Surgeon: Antionette Char, MD;  Location: [facility] ORS;  Service: Gynecology;  Laterality: N/A;  . SALPINGECTOMY Right 02/2013   op note in epic   Social History   Socioeconomic History  . Marital status: Married    Spouse name: Not on file  . Number of children: Not on file  . Years of education: Not on file  . Highest education level: Not on file  Occupational History  . Not on file  Tobacco Use  . Smoking status: Never Smoker  . Smokeless tobacco: Never Used  Vaping Use  . Vaping Use: Never used  Substance and Sexual Activity  . Alcohol use: No  . Drug use: No  . Sexual activity: Yes    Birth control/protection: None  Other Topics Concern  . Not on file  Social History Narrative   ** Merged History Encounter **       Social Determinants of Health   Financial Resource Strain: Not on file  Food Insecurity: Not on file  Transportation Needs: Not on file  Physical Activity: Not on file  Stress: Not on file  Social Connections:  Not on file  Intimate Partner Violence: Not on file   No current facility-administered medications on file prior to encounter.   Current Outpatient Medications on File Prior to Encounter  Medication Sig Dispense Refill  . ferrous sulfate 325 (65 FE) MG tablet Take 1 tablet (325 mg total) by mouth every other day. 60 tablet 2   Allergies  Allergen Reactions  . 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    . Vicodin [Hydrocodone-Acetaminophen] Other (See Comments)    Reaction:   Seizures; has tolerated acetaminophen and other narcotics in the past    I have reviewed patient's Past Medical Hx, Surgical Hx, Family Hx, Social Hx, medications and allergies.   ROS:  Review of Systems  Constitutional: Negative for chills, fatigue and fever.  Gastrointestinal: Positive for nausea and vomiting. Negative for abdominal pain.  Musculoskeletal: Negative for myalgias.  Neurological: Positive for dizziness and vertigo. Negative for headaches.   Review of Systems  Other systems negative   Physical Exam  Physical Exam Patient Vitals for the past 24 hrs:  BP Temp Pulse Resp SpO2 Height Weight  08/02/20 0041 114/68 -- 100 -- -- -- --  08/02/20 0040 110/74 -- 83 -- 100 % -- --  08/02/20 0037 126/69 -- 77 -- -- -- --  08/02/20 0012 125/66 98.5 F (36.9 C) 83 16 99 % 5\' 4"  (1.626 m) 95.7 kg   Constitutional: Well-developed, well-nourished female in no acute distress, but uncomfortable due to vertigo. .  Cardiovascular: normal rate, elevated with standing.  Respiratory: normal effort GI: Abd soft, non-tender.  MS: Extremities nontender, no edema, normal ROM Neurologic: Alert and oriented x 4.  GU: Neg CVAT.  PELVIC EXAM: deferred  LAB RESULTS No results found for this or any previous visit (from the past 24 hour(s)).  IMAGING   MAU Management/MDM: Ordered Meclizine for presumed vertigo.  History is c/w this.  However, she does have severe anemia so will treat for orthostasis also.  HR did increase with standing Hydration with IVF given x 2 liters.  Fereheme ordered and given   Patient tolerated well.   Zofran given for nausea with good relief.  Patient stated vertigo has improved, but still feels nauseated.  But states she wants to go home.   WIll Rx Zofran and Meclizine for home use.   ASSESSMENT Single IUP at [redacted]w[redacted]d Vertigo Severe anemia with orthostatic changes   PLAN Discharge home Rx Meclizine prn vertigo  Rx Zofran prn nausea Pt to followup with Dr  [redacted]w[redacted]d Pt stable at time of discharge. Encouraged to return here if she develops worsening of symptoms, increase in pain, fever, or other concerning symptoms.    Juliene Pina CNM, MSN Certified Nurse-Midwife 08/02/2020  12:45 AM

## 2020-09-10 ENCOUNTER — Telehealth (INDEPENDENT_AMBULATORY_CARE_PROVIDER_SITE_OTHER): Payer: Medicaid Other

## 2020-09-10 DIAGNOSIS — O09529 Supervision of elderly multigravida, unspecified trimester: Secondary | ICD-10-CM | POA: Insufficient documentation

## 2020-09-10 DIAGNOSIS — Z1331 Encounter for screening for depression: Secondary | ICD-10-CM

## 2020-09-10 DIAGNOSIS — O0990 Supervision of high risk pregnancy, unspecified, unspecified trimester: Secondary | ICD-10-CM

## 2020-09-10 NOTE — Progress Notes (Signed)
New OB Intake  I connected with  Nicole Landry on 09/10/20 at 11:15 AM EST by MyChart and verified that I am speaking with the correct person using two identifiers. Nurse is located at [facility] and pt is located at home.  I discussed the limitations, risks, security and privacy concerns of performing an evaluation and management service by telephone and the availability of in person appointments. I also discussed with the patient that there may be a patient responsible charge related to this service. The patient expressed understanding and agreed to proceed.  I explained I am completing New OB Intake today. We discussed her EDD of 03/14/21 that is based on LMP of 06/07/20. Pt is G7/P4. I reviewed her allergies, medications, Medical/Surgical/OB history, and appropriate screenings. I informed her of [facility] services. Based on history, this is a/an complicated by C/Sx3//Ectopic Pregnancy/GDM/Chronic Kidney Disease pregnancy.  Patient Active Problem List   Diagnosis Date Noted  . Cesarean delivery / repeat 04/12/2019  . Status post repeat low transverse cesarean section 04/12/2019  . Postpartum care following cesarean delivery 10/7 08/19/2017     Concerns addressed today  Delivery Plans:  Plans to deliver at [facility].   MyChart/Babyscripts MyChart access verified. I explained pt will have some visits in office and some virtually. Babyscripts instructions given. Account successfully created and app downloaded.  Blood Pressure Cuff Blood pressure cuff ordered for patient to pick-up from [employer]. Explained after first prenatal appt pt will check weekly and document in Babyscripts.Pt has own BP Cuff.  Anatomy US Explained first scheduled [place] will be around 19 weeks. Anatomy US scheduled for 10/22/20 at 9:00a. Pt notified to arrive at 8:45a.  Labs Discussed Avelina Laine genetic screening with patient. Would like both Panorama and Horizon drawn at new OB visit. Routine prenatal labs  needed.  Covid Vaccine Patient has not had covid vaccine.   [facility] Referral Patient is not interested in referral to [facility]. Pt states did apply but was denied.   First visit review I reviewed new OB appt with pt. I explained she will have a pelvic exam, ob bloodwork with genetic screening, and PAP smear. Explained pt will be seen by Dr. Crissie Reese at first visit; encounter routed to appropriate provider. If new patient offered monthly Zoom meeting  [name], [facility] 09/10/2020  11:19 AM

## 2020-09-10 NOTE — Progress Notes (Signed)
Chart reviewed for nurse visit. Agree with plan of care.   Venora Maples, MD 09/10/20 12:36 PM

## 2020-09-17 ENCOUNTER — Encounter: Payer: Self-pay | Admitting: Family Medicine

## 2020-09-17 ENCOUNTER — Ambulatory Visit (INDEPENDENT_AMBULATORY_CARE_PROVIDER_SITE_OTHER): Payer: Medicaid Other | Admitting: Family Medicine

## 2020-09-17 ENCOUNTER — Other Ambulatory Visit: Payer: Self-pay

## 2020-09-17 VITALS — BP 112/84 | HR 104 | Wt 222.0 lb

## 2020-09-17 DIAGNOSIS — O0990 Supervision of high risk pregnancy, unspecified, unspecified trimester: Secondary | ICD-10-CM

## 2020-09-17 DIAGNOSIS — Z98891 History of uterine scar from previous surgery: Secondary | ICD-10-CM | POA: Diagnosis not present

## 2020-09-17 DIAGNOSIS — O99012 Anemia complicating pregnancy, second trimester: Secondary | ICD-10-CM

## 2020-09-17 DIAGNOSIS — O99019 Anemia complicating pregnancy, unspecified trimester: Secondary | ICD-10-CM | POA: Insufficient documentation

## 2020-09-17 MED ORDER — ASPIRIN EC 81 MG PO TBEC: 81.0000 mg | DELAYED_RELEASE_TABLET | Freq: Every day | ORAL | 11 refills | Status: DC

## 2020-09-17 MED ORDER — PROMETHAZINE HCL 12.5 MG PO TABS: 12.5000 mg | ORAL_TABLET | Freq: Four times a day (QID) | ORAL | 1 refills | Status: DC | PRN

## 2020-09-17 NOTE — Patient Instructions (Signed)
 Contraception Choices Contraception, also called birth control, refers to methods or devices that prevent pregnancy. Hormonal methods Contraceptive implant A contraceptive implant is a thin, plastic tube that contains a hormone that prevents pregnancy. It is different from an intrauterine device (IUD). It is inserted into the upper part of the arm by a health care provider. Implants can be effective for up to 3 years. Progestin-only injections Progestin-only injections are injections of progestin, a synthetic form of the hormone progesterone. They are given every 3 months by a health care provider. Birth control pills Birth control pills are pills that contain hormones that prevent pregnancy. They must be taken once a day, preferably at the same time each day. A prescription is needed to use this method of contraception. Birth control patch The birth control patch contains hormones that prevent pregnancy. It is placed on the skin and must be changed once a week for three weeks and removed on the fourth week. A prescription is needed to use this method of contraception. Vaginal ring A vaginal ring contains hormones that prevent pregnancy. It is placed in the vagina for three weeks and removed on the fourth week. After that, the process is repeated with a new ring. A prescription is needed to use this method of contraception. Emergency contraceptive Emergency contraceptives prevent pregnancy after unprotected sex. They come in pill form and can be taken up to 5 days after sex. They work best the sooner they are taken after having sex. Most emergency contraceptives are available without a prescription. This method should not be used as your only form of birth control.   Barrier methods Female condom A female condom is a thin sheath that is worn over the penis during sex. Condoms keep sperm from going inside a woman's body. They can be used with a sperm-killing substance (spermicide) to increase their  effectiveness. They should be thrown away after one use. Female condom A female condom is a soft, loose-fitting sheath that is put into the vagina before sex. The condom keeps sperm from going inside a woman's body. They should be thrown away after one use. Diaphragm A diaphragm is a soft, dome-shaped barrier. It is inserted into the vagina before sex, along with a spermicide. The diaphragm blocks sperm from entering the uterus, and the spermicide kills sperm. A diaphragm should be left in the vagina for 6-8 hours after sex and removed within 24 hours. A diaphragm is prescribed and fitted by a health care provider. A diaphragm should be replaced every 1-2 years, after giving birth, after gaining more than 15 lb (6.8 kg), and after pelvic surgery. Cervical cap A cervical cap is a round, soft latex or plastic cup that fits over the cervix. It is inserted into the vagina before sex, along with spermicide. It blocks sperm from entering the uterus. The cap should be left in place for 6-8 hours after sex and removed within 48 hours. A cervical cap must be prescribed and fitted by a health care provider. It should be replaced every 2 years. Sponge A sponge is a soft, circular piece of polyurethane foam with spermicide in it. The sponge helps block sperm from entering the uterus, and the spermicide kills sperm. To use it, you make it wet and then insert it into the vagina. It should be inserted before sex, left in for at least 6 hours after sex, and removed and thrown away within 30 hours. Spermicides Spermicides are chemicals that kill or block sperm from entering the   cervix and uterus. They can come as a cream, jelly, suppository, foam, or tablet. A spermicide should be inserted into the vagina with an applicator at least 10-15 minutes before sex to allow time for it to work. The process must be repeated every time you have sex. Spermicides do not require a prescription.   Intrauterine  contraception Intrauterine device (IUD) An IUD is a T-shaped device that is put in a woman's uterus. There are two types:  Hormone IUD.This type contains progestin, a synthetic form of the hormone progesterone. This type can stay in place for 3-5 years.  Copper IUD.This type is wrapped in copper wire. It can stay in place for 10 years. Permanent methods of contraception Female tubal ligation In this method, a woman's fallopian tubes are sealed, tied, or blocked during surgery to prevent eggs from traveling to the uterus. Hysteroscopic sterilization In this method, a small, flexible insert is placed into each fallopian tube. The inserts cause scar tissue to form in the fallopian tubes and block them, so sperm cannot reach an egg. The procedure takes about 3 months to be effective. Another form of birth control must be used during those 3 months. Female sterilization This is a procedure to tie off the tubes that carry sperm (vasectomy). After the procedure, the man can still ejaculate fluid (semen). Another form of birth control must be used for 3 months after the procedure. Natural planning methods Natural family planning In this method, a couple does not have sex on days when the woman could become pregnant. Calendar method In this method, the woman keeps track of the length of each menstrual cycle, identifies the days when pregnancy can happen, and does not have sex on those days. Ovulation method In this method, a couple avoids sex during ovulation. Symptothermal method This method involves not having sex during ovulation. The woman typically checks for ovulation by watching changes in her temperature and in the consistency of cervical mucus. Post-ovulation method In this method, a couple waits to have sex until after ovulation. Where to find more information  Centers for Disease Control and Prevention: www.cdc.gov Summary  Contraception, also called birth control, refers to methods or  devices that prevent pregnancy.  Hormonal methods of contraception include implants, injections, pills, patches, vaginal rings, and emergency contraceptives.  Barrier methods of contraception can include female condoms, female condoms, diaphragms, cervical caps, sponges, and spermicides.  There are two types of IUDs (intrauterine devices). An IUD can be put in a woman's uterus to prevent pregnancy for 3-5 years.  Permanent sterilization can be done through a procedure for males and females. Natural family planning methods involve nothaving sex on days when the woman could become pregnant. This information is not intended to replace advice given to you by your health care provider. Make sure you discuss any questions you have with your health care provider. Document Revised: 11/27/2019 Document Reviewed: 11/27/2019 Elsevier Patient Education  2021 [employer]   Breastfeeding  Choosing to breastfeed is one of the best decisions you can make for yourself and your baby. A change in hormones during pregnancy causes your breasts to make breast milk in your milk-producing glands. Hormones prevent breast milk from being released before your baby is born. They also prompt milk flow after birth. Once breastfeeding has begun, thoughts of your baby, as well as his or her sucking or crying, can stimulate the release of milk from your milk-producing glands. Benefits of breastfeeding Research shows that breastfeeding offers many health benefits   for infants and mothers. It also offers a cost-free and convenient way to feed your baby. For your baby  Your first milk (colostrum) helps your baby's digestive system to function better.  Special cells in your milk (antibodies) help your baby to fight off infections.  Breastfed babies are less likely to develop asthma, allergies, obesity, or type 2 diabetes. They are also at lower risk for sudden infant death syndrome (SIDS).  Nutrients in breast milk are better  able to meet your baby's needs compared to infant formula.  Breast milk improves your baby's brain development. For you  Breastfeeding helps to create a very special bond between you and your baby.  Breastfeeding is convenient. Breast milk costs nothing and is always available at the correct temperature.  Breastfeeding helps to burn calories. It helps you to lose the weight that you gained during pregnancy.  Breastfeeding makes your uterus return faster to its size before pregnancy. It also slows bleeding (lochia) after you give birth.  Breastfeeding helps to lower your risk of developing type 2 diabetes, osteoporosis, rheumatoid arthritis, cardiovascular disease, and breast, ovarian, uterine, and endometrial cancer later in life. Breastfeeding basics Starting breastfeeding  Find a comfortable place to sit or lie down, with your neck and back well-supported.  Place a pillow or a rolled-up blanket under your baby to bring him or her to the level of your breast (if you are seated). Nursing pillows are specially designed to help support your arms and your baby while you breastfeed.  Make sure that your baby's tummy (abdomen) is facing your abdomen.  Gently massage your breast. With your fingertips, massage from the outer edges of your breast inward toward the nipple. This encourages milk flow. If your milk flows slowly, you may need to continue this action during the feeding.  Support your breast with 4 fingers underneath and your thumb above your nipple (make the letter "C" with your hand). Make sure your fingers are well away from your nipple and your baby's mouth.  Stroke your baby's lips gently with your finger or nipple.  When your baby's mouth is open wide enough, quickly bring your baby to your breast, placing your entire nipple and as much of the areola as possible into your baby's mouth. The areola is the colored area around your nipple. ? More areola should be visible above your  baby's upper lip than below the lower lip. ? Your baby's lips should be opened and extended outward (flanged) to ensure an adequate, comfortable latch. ? Your baby's tongue should be between his or her lower gum and your breast.  Make sure that your baby's mouth is correctly positioned around your nipple (latched). Your baby's lips should create a seal on your breast and be turned out (everted).  It is common for your baby to suck about 2-3 minutes in order to start the flow of breast milk. Latching Teaching your baby how to latch onto your breast properly is very important. An improper latch can cause nipple pain, decreased milk supply, and poor weight gain in your baby. Also, if your baby is not latched onto your nipple properly, he or she may swallow some air during feeding. This can make your baby fussy. Burping your baby when you switch breasts during the feeding can help to get rid of the air. However, teaching your baby to latch on properly is still the best way to prevent fussiness from swallowing air while breastfeeding. Signs that your baby has successfully latched onto   your nipple  Silent tugging or silent sucking, without causing you pain. Infant's lips should be extended outward (flanged).  Swallowing heard between every 3-4 sucks once your milk has started to flow (after your let-down milk reflex occurs).  Muscle movement above and in front of his or her ears while sucking. Signs that your baby has not successfully latched onto your nipple  Sucking sounds or smacking sounds from your baby while breastfeeding.  Nipple pain. If you think your baby has not latched on correctly, slip your finger into the corner of your baby's mouth to break the suction and place it between your baby's gums. Attempt to start breastfeeding again. Signs of successful breastfeeding Signs from your baby  Your baby will gradually decrease the number of sucks or will completely stop sucking.  Your baby  will fall asleep.  Your baby's body will relax.  Your baby will retain a small amount of milk in his or her mouth.  Your baby will let go of your breast by himself or herself. Signs from you  Breasts that have increased in firmness, weight, and size 1-3 hours after feeding.  Breasts that are softer immediately after breastfeeding.  Increased milk volume, as well as a change in milk consistency and color by the fifth day of breastfeeding.  Nipples that are not sore, cracked, or bleeding. Signs that your baby is getting enough milk  Wetting at least 1-2 diapers during the first 24 hours after birth.  Wetting at least 5-6 diapers every 24 hours for the first week after birth. The urine should be clear or pale yellow by the age of 5 days.  Wetting 6-8 diapers every 24 hours as your baby continues to grow and develop.  At least 3 stools in a 24-hour period by the age of 5 days. The stool should be soft and yellow.  At least 3 stools in a 24-hour period by the age of 7 days. The stool should be seedy and yellow.  No loss of weight greater than 10% of birth weight during the first 3 days of life.  Average weight gain of 4-7 oz (113-198 g) per week after the age of 4 days.  Consistent daily weight gain by the age of 5 days, without weight loss after the age of 2 weeks. After a feeding, your baby may spit up a small amount of milk. This is normal. Breastfeeding frequency and duration Frequent feeding will help you make more milk and can prevent sore nipples and extremely full breasts (breast engorgement). Breastfeed when you feel the need to reduce the fullness of your breasts or when your baby shows signs of hunger. This is called "breastfeeding on demand." Signs that your baby is hungry include:  Increased alertness, activity, or restlessness.  Movement of the head from side to side.  Opening of the mouth when the corner of the mouth or cheek is stroked (rooting).  Increased  sucking sounds, smacking lips, cooing, sighing, or squeaking.  Hand-to-mouth movements and sucking on fingers or hands.  Fussing or crying. Avoid introducing a pacifier to your baby in the first 4-6 weeks after your baby is born. After this time, you may choose to use a pacifier. Research has shown that pacifier use during the first year of a baby's life decreases the risk of sudden infant death syndrome (SIDS). Allow your baby to feed on each breast as long as he or she wants. When your baby unlatches or falls asleep while feeding from the   first breast, offer the second breast. Because newborns are often sleepy in the first few weeks of life, you may need to awaken your baby to get him or her to feed. Breastfeeding times will vary from baby to baby. However, the following rules can serve as a guide to help you make sure that your baby is properly fed:  Newborns (babies 4 weeks of age or younger) may breastfeed every 1-3 hours.  Newborns should not go without breastfeeding for longer than 3 hours during the day or 5 hours during the night.  You should breastfeed your baby a minimum of 8 times in a 24-hour period. Breast milk pumping Pumping and storing breast milk allows you to make sure that your baby is exclusively fed your breast milk, even at times when you are unable to breastfeed. This is especially important if you go back to work while you are still breastfeeding, or if you are not able to be present during feedings. Your lactation consultant can help you find a method of pumping that works best for you and give you guidelines about how long it is safe to store breast milk.      Caring for your breasts while you breastfeed Nipples can become dry, cracked, and sore while breastfeeding. The following recommendations can help keep your breasts moisturized and healthy:  Avoid using soap on your nipples.  Wear a supportive bra designed especially for nursing. Avoid wearing underwire-style  bras or extremely tight bras (sports bras).  Air-dry your nipples for 3-4 minutes after each feeding.  Use only cotton bra pads to absorb leaked breast milk. Leaking of breast milk between feedings is normal.  Use lanolin on your nipples after breastfeeding. Lanolin helps to maintain your skin's normal moisture barrier. Pure lanolin is not harmful (not toxic) to your baby. You may also hand express a few drops of breast milk and gently massage that milk into your nipples and allow the milk to air-dry. In the first few weeks after giving birth, some women experience breast engorgement. Engorgement can make your breasts feel heavy, warm, and tender to the touch. Engorgement peaks within 3-5 days after you give birth. The following recommendations can help to ease engorgement:  Completely empty your breasts while breastfeeding or pumping. You may want to start by applying warm, moist heat (in the shower or with warm, water-soaked hand towels) just before feeding or pumping. This increases circulation and helps the milk flow. If your baby does not completely empty your breasts while breastfeeding, pump any extra milk after he or she is finished.  Apply ice packs to your breasts immediately after breastfeeding or pumping, unless this is too uncomfortable for you. To do this: ? Put ice in a plastic bag. ? Place a towel between your skin and the bag. ? Leave the ice on for 20 minutes, 2-3 times a day.  Make sure that your baby is latched on and positioned properly while breastfeeding. If engorgement persists after 48 hours of following these recommendations, contact your health care provider or a lactation consultant. Overall health care recommendations while breastfeeding  Eat 3 healthy meals and 3 snacks every day. Well-nourished mothers who are breastfeeding need an additional 450-500 calories a day. You can meet this requirement by increasing the amount of a balanced diet that you eat.  Drink  enough water to keep your urine pale yellow or clear.  Rest often, relax, and continue to take your prenatal vitamins to prevent fatigue, stress, and low   vitamin and mineral levels in your body (nutrient deficiencies).  Do not use any products that contain nicotine or tobacco, such as cigarettes and e-cigarettes. Your baby may be harmed by chemicals from cigarettes that pass into breast milk and exposure to secondhand smoke. If you need help quitting, ask your health care provider.  Avoid alcohol.  Do not use illegal drugs or marijuana.  Talk with your health care provider before taking any medicines. These include over-the-counter and prescription medicines as well as vitamins and herbal supplements. Some medicines that may be harmful to your baby can pass through breast milk.  It is possible to become pregnant while breastfeeding. If birth control is desired, ask your health care provider about options that will be safe while breastfeeding your baby. Where to find more information: [employer]: www.llli.org Contact a health care provider if:  You feel like you want to stop breastfeeding or have become frustrated with breastfeeding.  Your nipples are cracked or bleeding.  Your breasts are red, tender, or warm.  You have: ? Painful breasts or nipples. ? A swollen area on either breast. ? A fever or chills. ? Nausea or vomiting. ? Drainage other than breast milk from your nipples.  Your breasts do not become full before feedings by the fifth day after you give birth.  You feel sad and depressed.  Your baby is: ? Too sleepy to eat well. ? Having trouble sleeping. ? More than 1 week old and wetting fewer than 6 diapers in a 24-hour period. ? Not gaining weight by 5 days of age.  Your baby has fewer than 3 stools in a 24-hour period.  Your baby's skin or the white parts of his or her eyes become yellow. Get help right away if:  Your baby is overly tired  (lethargic) and does not want to wake up and feed.  Your baby develops an unexplained fever. Summary  Breastfeeding offers many health benefits for infant and mothers.  Try to breastfeed your infant when he or she shows early signs of hunger.  Gently tickle or stroke your baby's lips with your finger or nipple to allow the baby to open his or her mouth. Bring the baby to your breast. Make sure that much of the areola is in your baby's mouth. Offer one side and burp the baby before you offer the other side.  Talk with your health care provider or lactation consultant if you have questions or you face problems as you breastfeed. This information is not intended to replace advice given to you by your health care provider. Make sure you discuss any questions you have with your health care provider. Document Revised: 09/16/2017 Document Reviewed: 07/24/2016 Elsevier Patient Education  2021 [employer]  

## 2020-09-17 NOTE — BH Specialist Note (Signed)
Integrated Behavioral Health via Telemedicine Visit  09/17/2020 Nicole Landry [id number]  Number of Integrated Behavioral Health visits: 1 Session Start time: 8:48  Session End time: 9:51 Total time: 45  Referring Provider: Merian Capron, MD Patient/Family location: Home [facility] Provider location: [facility]  All persons participating in visit: Patient Nicole Landry and [facility] Jesselee Poth   Types of Service: Individual psychotherapy and Video visit  I connected with Nicole Landry and/or Nicole Landry's n/a via  Telephone or Video Enabled Telemedicine Application  (Video is Caregility application) and verified that I am speaking with the correct person using two identifiers. Discussed confidentiality: Yes   I discussed the limitations of telemedicine and the availability of in person appointments.  Discussed there is a possibility of technology failure and discussed alternative modes of communication if that failure occurs.  I discussed that engaging in this telemedicine visit, they consent to the provision of behavioral healthcare and the services will be billed under their insurance.  Patient and/or legal guardian expressed understanding and consented to Telemedicine visit: Yes   Presenting Concerns: Patient and/or family reports the following symptoms/concerns: Pt states her primary goal is to prevent depression postpartum, as previously experienced; primary concern today is feeling depressed, anxiety with panic attacks, attributed to current life stress. Pt is open to implementing self-coping strategy today.  Duration of problem: Increase in past year; Severity of problem: moderate  Patient and/or Family's Strengths/Protective Factors: Social connections, Social and Emotional competence, Concrete supports in place (healthy food, safe environments, etc.), Sense of purpose and Physical Health (exercise, healthy  diet, medication compliance, etc.)  Goals Addressed: Patient will: 1.  Reduce symptoms of: anxiety, depression and stress  2.  Increase knowledge and/or ability of: healthy habits and self-management skills  3.  Demonstrate ability to: Increase healthy adjustment to current life circumstances  Progress towards Goals: Ongoing  Interventions: Interventions utilized:  Mindfulness or Management consultant and Psychoeducation and/or Health Education Standardized Assessments completed: Not Needed  Patient and/or Family Response: Pt agrees with treatment plan  Assessment: Patient currently experiencing Adjustment disorder with mixed anxious and depressed mood.   Patient may benefit from psychoeducation and brief therapeutic interventions regarding coping with symptoms of anxiety, depression, stress .  Plan: 1. Follow up with behavioral health clinician on : Two weeks 2. Behavioral recommendations:  -Continue taking prenatal vitamin daily, as recommended by medical provider -Consider pairing high iron foods with high vitamin C foods of choice, when appetite returns -CALM relaxation breathing exercise twice daily for two weeks (morning; at bedtime with sleep sounds, as discussed) -Continue monthly date nights with husband; continue planning for at least one child-free weekend this summer -Read educational materials regarding coping with symptoms of anxiety with panic  3. Referral(s): Integrated [employer] (In Clinic)  I discussed the assessment and treatment plan with the patient and/or parent/guardian. They were provided an opportunity to ask questions and all were answered. They agreed with the plan and demonstrated an understanding of the instructions.   They were advised to call back or seek an in-person evaluation if the symptoms worsen or if the condition fails to improve as anticipated.  Rae Lips, LCSW   Depression screen [facility] 2/9 09/10/2020 02/19/2016 07/09/2015   Decreased Interest 0 0 0  Down, Depressed, Hopeless 1 0 0  PHQ - 2 Score 1 0 0  Altered sleeping 1 - -  Tired, decreased energy 1 - -  Change  in appetite 0 - -  Feeling bad or failure about yourself  1 - -  Trouble concentrating 1 - -  Moving slowly or fidgety/restless 0 - -  Suicidal thoughts 0 - -  PHQ-9 Score 5 - -   GAD 7 : Generalized Anxiety Score 09/10/2020  Nervous, Anxious, on Edge 1  Control/stop worrying 1  Worry too much - different things 1  Trouble relaxing 1  Restless 1  Easily annoyed or irritable 1  Afraid - awful might happen 0  Total GAD 7 Score 6

## 2020-09-17 NOTE — Progress Notes (Signed)
Subjective:   Nicole Landry is a 35 y.o. [id number] at [redacted]w[redacted]d by LMP (regular, not on birth control) and 5 wk [place] being seen today for her first obstetrical visit. Transfer from [employer] due to insurance issues. Her obstetrical history is significant for advanced maternal age and hx GDM, and CS x3. Patient does intend to breast feed. Pregnancy history fully reviewed.  Patient reports fatigue and nausea.  HISTORY: OB History  Gravida Para Term Preterm AB Living  7 4 4  0 2 4  SAB IAB Ectopic Multiple Live Births  1 0 1 0 4    # Outcome Date GA Lbr Len/2nd Weight Sex Delivery Anes PTL Lv  7 Current           6 Term 04/12/19 [redacted]w[redacted]d  8 lb 11.3 oz (3.95 kg) M CS-LTranv Spinal  LIV     Name: [place],BOY Daci     Apgar1: 8  Apgar5: 9  5 Term 08/18/17 [redacted]w[redacted]d  8 lb 15.2 oz (4.06 kg) M CS-LTranv Spinal  LIV     Name: [place],BOY Kimmy     Apgar1: 8  Apgar5: 9  4 Term 11/20/15 [redacted]w[redacted]d 02:37 / 01:23 8 lb 2.2 oz (3.69 kg) F VBAC, Vacuum Local, EPI  LIV     Name: Baum,PENDINGBABY     Apgar1: 8  Apgar5: 9  3 Ectopic 2014             Birth Comments: System Generated. Please review and update pregnancy details.  2 Term 05/26/10    M CS-LTranv   LIV  1 SAB             Obstetric Comments  Pt states does not want a C/S for this Pregnancy.   Last pap smear was  2019 and was normal Past Medical History:  Diagnosis Date  . Blood transfusion without reported diagnosis    after ectopic  . Chronic kidney disease   . Ectopic pregnancy, tubal   . Gestational diabetes   . Gestational diabetes mellitus 2011   denies gestational DM with current pregnancy  . Ovarian cyst   . Status post vacuum-assisted vaginal delivery (5/17) 11/20/2015   Past Surgical History:  Procedure Laterality Date  . BILATERAL SALPINGECTOMY Right 02/08/2013   Procedure:  SALPINGECTOMY;  Surgeon: 04/10/2013, MD;  Location: [facility] ORS;  Service: Gynecology;  Laterality: Right;  . CESAREAN SECTION    . CESAREAN  SECTION N/A 08/18/2017   Procedure: Repeat CESAREAN SECTION;  Surgeon: 08/20/2017, MD;  Location: [facility];  Service: Obstetrics;  Laterality: N/A;  EDD: 08/24/17 Allergy: Vicodin  . CESAREAN SECTION N/A 04/12/2019   Procedure: Repeat CESAREAN SECTION;  Surgeon: 06/12/2019, MD;  Location: [facility] LD ORS;  Service: Obstetrics;  Laterality: N/A;  EDD: 04/17/19 Allergy: Vicodin  . DILATION AND CURETTAGE OF UTERUS    . LAPAROSCOPY N/A 02/08/2013   Procedure: LAPAROSCOPY OPERATIVE;  Surgeon: 04/10/2013, MD;  Location: [facility] ORS;  Service: Gynecology;  Laterality: N/A;  . SALPINGECTOMY Right 02/2013   op note in epic   History reviewed. No pertinent family history. Social History   Tobacco Use  . Smoking status: Never Smoker  . Smokeless tobacco: Never Used  Vaping Use  . Vaping Use: Never used  Substance Use Topics  . Alcohol use: No  . Drug use: No   Allergies  Allergen Reactions  . Food Anaphylaxis and Other (See Comments)    Pt is allergic to Cantalope  . Vicodin [  Hydrocodone-Acetaminophen] Other (See Comments)    Reaction:  Seizures; has tolerated acetaminophen and other narcotics in the past   Current Outpatient Medications on File Prior to Visit  Medication Sig Dispense Refill  . ferrous sulfate 325 (65 FE) MG tablet Take 1 tablet (325 mg total) by mouth every other day. 60 tablet 2  . meclizine (ANTIVERT) 25 MG tablet Take 1 tablet (25 mg total) by mouth every 8 (eight) hours as needed for dizziness. 30 tablet 0  . ondansetron (ZOFRAN ODT) 4 MG disintegrating tablet Take 1 tablet (4 mg total) by mouth every 6 (six) hours as needed for nausea. 20 tablet 0  . Prenatal Vit-Fe Fumarate-FA (PRENATAL MULTIVITAMIN) TABS tablet Take 1 tablet by mouth daily at 12 noon.     No current facility-administered medications on file prior to visit.     Exam   Vitals:   09/17/20 1410  BP: 112/84  Pulse: (!) 104  Weight: 222 lb (100.7 kg)   Fetal Heart Rate (bpm):  152  Uterus:     System: General: well-developed, well-nourished female in no acute distress   Skin: normal coloration and turgor, no rashes   Neurologic: oriented, normal, negative, normal mood   Extremities: normal strength, tone, and muscle mass, ROM of all joints is normal   HEENT PERRLA, extraocular movement intact and sclera clear, anicteric   Neck supple and no masses   Respiratory:  no respiratory distress     Assessment:   Pregnancy: [id number] Patient Active Problem List   Diagnosis Date Noted  . Supervision of high risk pregnancy, antepartum 09/10/2020  . Cesarean delivery / repeat 04/12/2019  . Status post repeat low transverse cesarean section 04/12/2019  . Postpartum care following cesarean delivery 10/7 08/19/2017  . Abnormal findings on diagnostic imaging of other specified body structures 08/13/2014     Plan:  1. Supervision of high risk pregnancy, antepartum BP and FHR normal Will start prenatal ASA Initial labs drawn. Continue prenatal vitamins. Genetic Screening discussed, NIPS: ordered. Ultrasound discussed; fetal anatomic survey: ordered. Problem list reviewed and updated. The nature of [facility] with multiple MDs and other Advanced Practice Providers was explained to patient; also emphasized that residents, students are part of our team.  2. Status post repeat low transverse cesarean section Desires TOLAC but discussed in detail why this would not be recommended given her hx of 3 prior cesareans Plan for repeat at 39 weeks  3. History of gestational diabetes mellitus Check A1c today  4. Anemia of pregnancy Last hgb 7.4 and on PO iron, recheck today but may need IV iron  Routine obstetric precautions reviewed. Return in 4 weeks (on 10/15/2020) for ob visit.

## 2020-09-18 LAB — CBC/D/PLT+RPR+RH+ABO+RUB AB...
Antibody Screen: NEGATIVE
Basophils Absolute: 0 10*3/uL (ref 0.0–0.2)
Basos: 0 %
EOS (ABSOLUTE): 0 10*3/uL (ref 0.0–0.4)
Eos: 0 %
HCV Ab: <0.1 s/co ratio (ref 0.0–0.9)
HIV Screen 4th Generation wRfx: NONREACTIVE
Hematocrit: 33.2 % — ABNORMAL LOW (ref 34.0–46.6)
Hemoglobin: 10.8 g/dL — ABNORMAL LOW (ref 11.1–15.9)
Hepatitis B Surface Ag: NEGATIVE
Immature Grans (Abs): 0 10*3/uL (ref 0.0–0.1)
Immature Granulocytes: 0 %
Lymphocytes Absolute: 1.2 10*3/uL (ref 0.7–3.1)
Lymphs: 18 %
MCH: 25.5 pg — ABNORMAL LOW (ref 26.6–33.0)
MCHC: 32.5 g/dL (ref 31.5–35.7)
MCV: 79 fL (ref 79–97)
Monocytes Absolute: 0.3 10*3/uL (ref 0.1–0.9)
Monocytes: 4 %
Neutrophils Absolute: 5.3 10*3/uL (ref 1.4–7.0)
Neutrophils: 78 %
Platelets: 377 10*3/uL (ref 150–450)
RBC: 4.23 x10E6/uL (ref 3.77–5.28)
RDW: 21.4 % — ABNORMAL HIGH (ref 11.7–15.4)
RPR Ser Ql: NONREACTIVE
Rh Factor: POSITIVE
Rubella Antibodies, IGG: 4.36 index (ref >0.99)
WBC: 6.9 10*3/uL (ref 3.4–10.8)

## 2020-09-18 LAB — HEMOGLOBIN A1C
Est. average glucose Bld gHb Est-mCnc: 88 mg/dL
Hgb A1c MFr Bld: 4.7 % — ABNORMAL LOW (ref 4.8–5.6)

## 2020-09-18 LAB — HCV INTERPRETATION

## 2020-09-19 ENCOUNTER — Encounter: Payer: Self-pay | Admitting: *Deleted

## 2020-09-23 ENCOUNTER — Encounter: Payer: Self-pay | Admitting: *Deleted

## 2020-09-25 ENCOUNTER — Ambulatory Visit (INDEPENDENT_AMBULATORY_CARE_PROVIDER_SITE_OTHER): Payer: Medicaid Other | Admitting: Clinical

## 2020-09-25 DIAGNOSIS — F4323 Adjustment disorder with mixed anxiety and depressed mood: Secondary | ICD-10-CM | POA: Diagnosis not present

## 2020-09-25 DIAGNOSIS — Z1331 Encounter for screening for depression: Secondary | ICD-10-CM | POA: Diagnosis not present

## 2020-09-25 NOTE — BH Specialist Note (Signed)
Pt did not arrive to video visit and did not answer the phone ; Left HIPPA-compliant message to call back Alianis Trimmer from [facility] at [phone] (main office) or [phone] (Ginna Schuur's office).  ; left MyChart message for patient.      

## 2020-09-25 NOTE — Patient Instructions (Signed)
[facility] [address] [place], [place] [address] [phone] (main office) [phone] (Lawton Dollinger's office)  Coping with Panic Attacks   What is a panic attack?  You may have had a panic attack if you experienced four or more of the symptoms listed below coming on abruptly and peaking in about 10 minutes.  Panic Symptoms   . Pounding heart  . Sweating  . Trembling or shaking  . Shortness of breath  . Feeling of choking  . Chest pain  . Nausea or abdominal distress    . Feeling dizzy, unsteady, lightheaded, or faint  . Feelings of unreality or being detached from yourself  . Fear of losing control or going crazy  . Fear of dying  . Numbness or tingling  . Chills or hot flashes      Panic attacks are sometimes accompanied by avoidance of certain places or situations. These are often situations that would be difficult to escape from or in which help might not be available. Examples might include crowded shopping malls, public transportation, restaurants, or driving.   Why do panic attacks occur?   Panic attacks are the body's alarm system gone awry. All of us have a built-in alarm system, powered by adrenaline, which increases our heart rate, breathing, and blood flow in response to danger. Ordinarily, this 'danger response system' works well. In some people, however, the response is either out of proportion to whatever stress is going on, or may come out of the blue without any stress at all.   For example, if you are walking in the woods and see a bear coming your way, a variety of changes occur in your body to prepare you to either fight the danger or flee from the situation. Your heart rate will increase to get more blood flow around your body, your breathing rate will quicken so that more oxygen is available, and your muscles will tighten in order to be ready to fight or run. You may feel nauseated as blood flow leaves your  stomach area and moves into your limbs. These bodily changes are all essential to helping you survive the dangerous situation. After the danger has passed, your body functions will begin to go back to normal. This is because your body also has a system for "recovering" by bringing your body back down to a normal state when the danger is over.   As you can see, the emergency response system is adaptive when there is, in fact, a "true" or "real" danger (e.g., bear). However, sometimes people find that their emergency response system is triggered in "everyday" situations where there really is no true physical danger (e.g., in a meeting, in the grocery store, while driving in normal traffic, etc.).   What triggers a panic attack?  Sometimes particularly stressful situations can trigger a panic attack. For example, an argument with your spouse or stressors at work can cause a stress response (activating the emergency response system) because you perceive it as threatening or overwhelming, even if there is no direct risk to your survival.  Sometimes panic attacks don't seem to be triggered by anything in particular- they may "come out of the blue". Somehow, the natural "fight or flight" emergency response system has gotten activated when there is no real danger. Why does the body go into "emergency mode" when there is no real danger?   Often, people with panic attacks are frightened or alarmed by the physical sensations of   the emergency response system. First, unexpected physical sensations are experienced (tightness in your chest or some shortness of breath). This then leads to feeling fearful or alarmed by these symptoms ("Something's wrong!", "Am I having a heart attack?", "Am I going to faint?") The mind perceives that there is a danger even though no real danger exists. This, in turn, activates the emergency response system ("fight or flight"), leading to a "full blown" panic attack. In summary, panic  attacks occur when we misinterpret physical symptoms as signs of impending death, craziness, loss of control, embarrassment, or fear of fear. Sometimes you may be aware of thoughts of danger that activate the emergency response system (for example, thinking "I'm having a heart attack" when you feel chest pressure or increased heart rate). At other times, however, you may not be aware of such thoughts. After several incidences of being afraid of physical sensations, anxiety and panic can occur in response to the initial sensations without conscious thoughts of danger. Instead, you just feel afraid or alarmed. In other words, the panic or fear may seem to occur "automatically" without you consciously telling yourself anything.   After having had one or more panic attacks, you may also become more focused on what is going on inside your body. You may scan your body and be more vigilant about noticing any symptoms that might signal the start of a panic attack. This makes it easier for panic attacks to happen again because you pick up on sensations you might otherwise not have noticed, and misinterpret them as something dangerous. A panic attack may then result.      How do I cope with panic attacks?  An important part of overcoming panic attacks involves re-interpreting your body's physical reactions and teaching yourself ways to decrease the physical arousal. This can be done through practicing the cognitive and behavioral interventions below.   Research has found that over half of people who have panic attacks show some signs of hyperventilation or overbreathing. This can produce initial sensations that alarm you and lead to a panic attack. Overbreathing can also develop as part of the panic attack and make the symptoms worse. When people hyperventilate, certain blood vessels in the body become narrower. In particular, the brain may get slightly less oxygen. This can lead to the symptoms of dizziness,  confusion, and lightheadedness that often occur during panic attacks. Other parts of the body may also get a bit less oxygen, which may lead to numbness or tingling in the hands or feet or the sensation of cold, clammy hands. It also may lead the heart to pump harder. Although these symptoms may be frightening and feel unpleasant, it is important to remember that hyperventilating is not dangerous. However, you can help overcome the unpleasantness of overbreathing by practicing Breathing Retraining.   Practice this basic technique three times a day, every day:  . Inhale. With your shoulders relaxed, inhale as slowly and deeply as you can while you count to six. If you can, use your diaphragm to fill your lungs with air.  . Hold. Keep the air in your lungs as you slowly count to four.  . Exhale. Slowly breath out as you count to six.  . Repeat. Do the inhale-hold-exhale cycle several times. Each time you do it, exhale for longer counts.  Like any new skill, Breathing Retraining requires practice. Try practicing this skill twice a day for several minutes. Initially, do not try this technique in specific situations or when you   become frightened or have a panic attack. Begin by practicing in a quiet environment to build up your skill level so that you can later use it in time of "emergency."   2. Decreasing Avoidance  Regardless of whether you can identify why you began having panic attacks or whether they seemed to come out of the blue, the places where you began having panic attacks often can become triggers themselves. It is not uncommon for individuals to begin to avoid the places where they have had panic attacks. Over time, the individual may begin to avoid more and more places, thereby decreasing their activities and often negatively impacting their quality of life. To break the cycle of avoidance, it is important to first identify the places or situations that are being avoided, and then to do some  "relearning."  To begin this intervention, first create a list of locations or situations that you tend to avoid. Then choose an avoided location or situation that you would like to target first. Now develop an "exposure hierarchy" for this situation or location. An "exposure hierarchy" is a list of actions that make you feel anxious in this situation. Order these actions from least to most anxiety-producing. It is often helpful to have the first item on your hierarchy involve thinking or imagining part of the feared/avoided situation.   Here is an example of an exposure hierarchy for decreasing avoidance of the grocery store. Note how it is ordered from the least amount of anxiety (at the top) to the most anxiety (at the bottom):  . Think about going to the grocery store alone.  . Go to the grocery store with a friend or family member.  . Go to the grocery store alone to pick up a few small items (5-10 minutes in the store).  . Shopping for 10-20 minutes in the store alone.  . Doing the shopping for the week by myself (20-30 minutes in the store).   Your homework is to "expose" yourself to the lowest item on your hierarchy and use your breathing relaxation and coping statements (see below) to help you remain in the situation. Practice this several times during the upcoming week. Once you have mastered each item with minimal anxiety, move on to the next higher action on your list.   Cognitive Interventions  1. Identify your negative self-talk Anxious thoughts can increase anxiety symptoms and panic. The first step in changing anxious thinking is to identify your own negative, alarming self-talk. Some common alarming thoughts:  . I'm having a heart attack.            . I must be going crazy. . I think I'm dying. . People will think I'm crazy. . I'm going to pass our.  . Oh no- here it comes.  . I can't stand this.  . I've got to get out of here!  2. Use positive coping statements Changing or  disrupting a pattern of anxious thoughts by replacing them with more calming or supportive statements can help to divert a panic attack. Some common helpful coping statements:  . This is not an emergency.  . I don't like feeling this way, but I can accept it.  . I can feel like this and still be okay.  . This has happened before, and I was okay. I'll be okay this time, too.  . I can be anxious and still deal with this situation.    /Emotional Wellbeing Apps and Websites Here are a few   free apps meant to help you to help yourself.  To find, try searching on the internet to see if the app is offered on Apple/Android devices. If your first choice doesn't come up on your device, the good news is that there are many choices! Play around with different apps to see which ones are helpful to you.    Calm This is an app meant to help increase calm feelings. Includes info, strategies, and tools for tracking your feelings.      Calm Harm  This app is meant to help with self-harm. Provides many 5-minute or 15-min coping strategies for doing instead of hurting yourself.       Healthy Minds Health Minds is a problem-solving tool to help deal with emotions and cope with stress you encounter wherever you are.      MindShift This app can help people cope with anxiety. Rather than trying to avoid anxiety, you can make an important shift and face it.      MY3  MY3 features a support system, safety plan and resources with the goal of offering a tool to use in a time of need.       My Life My Voice  This mood journal offers a simple solution for tracking your thoughts, feelings and moods. Animated emoticons can help identify your mood.       Relax Melodies Designed to help with sleep, on this app you can mix sounds and meditations for relaxation.      Smiling Mind Smiling Mind is meditation made easy: it's a simple tool that helps put a smile on your mind.        Stop, Breathe &  Think  A friendly, simple guide for people through meditations for mindfulness and compassion.  Stop, Breathe and Think Kids Enter your current feelings and choose a "mission" to help you cope. Offers videos for certain moods instead of just sound recordings.       Team Orange The goal of this tool is to help teens change how they think, act, and react. This app helps you focus on your own good feelings and experiences.      The Virtual Hope Box The Virtual Hope Box (VHB) contains simple tools to help patients with coping, relaxation, distraction, and positive thinking.     

## 2020-09-26 ENCOUNTER — Other Ambulatory Visit: Payer: Self-pay | Admitting: Family Medicine

## 2020-10-09 ENCOUNTER — Ambulatory Visit: Payer: Medicaid Other | Admitting: Clinical

## 2020-10-09 DIAGNOSIS — Z91199 Patient's noncompliance with other medical treatment and regimen due to unspecified reason: Secondary | ICD-10-CM

## 2020-10-09 DIAGNOSIS — Z5329 Procedure and treatment not carried out because of patient's decision for other reasons: Secondary | ICD-10-CM

## 2020-10-17 ENCOUNTER — Encounter: Payer: Medicaid Other | Admitting: Student

## 2020-10-22 ENCOUNTER — Ambulatory Visit (INDEPENDENT_AMBULATORY_CARE_PROVIDER_SITE_OTHER): Payer: Medicaid Other | Admitting: Obstetrics & Gynecology

## 2020-10-22 ENCOUNTER — Other Ambulatory Visit: Payer: Self-pay | Admitting: *Deleted

## 2020-10-22 ENCOUNTER — Encounter: Payer: Self-pay | Admitting: *Deleted

## 2020-10-22 ENCOUNTER — Other Ambulatory Visit: Payer: Self-pay

## 2020-10-22 ENCOUNTER — Encounter: Payer: Self-pay | Admitting: Obstetrics & Gynecology

## 2020-10-22 ENCOUNTER — Ambulatory Visit: Payer: Medicaid Other | Admitting: *Deleted

## 2020-10-22 ENCOUNTER — Ambulatory Visit: Payer: Medicaid Other | Attending: Family Medicine

## 2020-10-22 VITALS — BP 129/80 | HR 99 | Wt 228.6 lb

## 2020-10-22 DIAGNOSIS — O99012 Anemia complicating pregnancy, second trimester: Secondary | ICD-10-CM

## 2020-10-22 DIAGNOSIS — O0990 Supervision of high risk pregnancy, unspecified, unspecified trimester: Secondary | ICD-10-CM

## 2020-10-22 DIAGNOSIS — O09522 Supervision of elderly multigravida, second trimester: Secondary | ICD-10-CM

## 2020-10-22 DIAGNOSIS — O99210 Obesity complicating pregnancy, unspecified trimester: Secondary | ICD-10-CM | POA: Insufficient documentation

## 2020-10-22 DIAGNOSIS — Z3A19 19 weeks gestation of pregnancy: Secondary | ICD-10-CM

## 2020-10-22 DIAGNOSIS — R5383 Other fatigue: Secondary | ICD-10-CM

## 2020-10-22 DIAGNOSIS — Z98891 History of uterine scar from previous surgery: Secondary | ICD-10-CM

## 2020-10-22 DIAGNOSIS — O09529 Supervision of elderly multigravida, unspecified trimester: Secondary | ICD-10-CM

## 2020-10-22 NOTE — Patient Instructions (Signed)

## 2020-10-22 NOTE — Progress Notes (Signed)
PRENATAL VISIT NOTE  Subjective:  Nicole Landry is a 35 y.o. [id number] at [redacted]w[redacted]d being seen today for ongoing prenatal care.  She is currently monitored for the following issues for this high-risk pregnancy and has Status post repeat low transverse cesarean section; Encounter for supervision of high-risk pregnancy with multigravida of advanced maternal age; Abnormal findings on diagnostic imaging of other specified body structures; Anemia of pregnancy; and Obesity in pregnancy, antepartum on their problem list.  Patient reports fatigue, not getting better.  Recent labs showed hemoglobin was  10.8, but has history of iron deficiency anemia.  Contractions: Not present.  .  Movement: Present. Denies leaking of fluid.   The following portions of the patient's history were reviewed and updated as appropriate: allergies, current medications, past family history, past medical history, past social history, past surgical history and problem list.   Objective:   Vitals:   10/22/20 1108  BP: 129/80  Pulse: 99  Weight: 228 lb 9.6 oz (103.7 kg)    Fetal Status: Fetal Heart Rate (bpm): 157   Movement: Present     General:  Alert, oriented and cooperative. Patient is in no acute distress.  Skin: Skin is warm and dry. No rash noted.   Cardiovascular: Normal heart rate noted  Respiratory: Normal respiratory effort, no problems with respiration noted  Abdomen: Soft, gravid, appropriate for gestational age.  Pain/Pressure: Absent     Pelvic: Cervical exam deferred        Extremities: Normal range of motion.  Edema: None  Mental Status: Normal mood and affect. Normal behavior. Normal judgment and thought content.   Imaging: [place] MFM OB DETAIL +14 WK  Result Date: 10/22/2020 ----------------------------------------------------------------------  OBSTETRICS REPORT                       (Signed Final 10/22/2020 10:05 am) ---------------------------------------------------------------------- Patient Info   ID #:       [id number]                          D.O.B.:  05/31/1986 (34 yrs)  Name:       Nicole Landry              Visit Date: 10/22/2020 08:47 am ---------------------------------------------------------------------- Performed By  Attending:        Ma Rings MD         Secondary Phy.:   [facility]  Performed By:     Sandi Mealy        Address:          [address]                    [facility]                                                             [place], [place]  [address]  Referred By:      Mary Sella              [facility] MD                               [facility]  Ref. Address:     [address]                    [place], [place]                    [address] ---------------------------------------------------------------------- Orders  #  Description                           Code        Ordered By  1  [place] MFM OB DETAIL +14 WK               [id number]    Merian Capron ----------------------------------------------------------------------  #  Order #                     Accession #                Episode #  1  [id number]                   [id number]                 [id number] ---------------------------------------------------------------------- Indications  Advanced maternal age multigravida 2+,        O65.522  second trimester (35 y.o)  Obesity complicating pregnancy, second         O99.212  trimester (BMI 36)Z  History of cesarean delivery, currently        O34.219  pregnant (3x)  Poor obstetric history: Previous gestational   O09.299  diabetes (2011)  Encounter for antenatal screening for          Z36.3  malformations  [redacted] weeks gestation of  pregnancy                Z3A.19  Low Risk NIPS  Chronic Kidney Disease ---------------------------------------------------------------------- Fetal Evaluation  Num Of Fetuses:         1  Fetal Heart Rate(bpm):  152  Cardiac Activity:       Observed  Presentation:  Cephalic  Placenta:               Anterior  P. Cord Insertion:      Not well visualized  Amniotic Fluid  AFI FV:      Within normal limits                              Largest Pocket(cm)                              6.68 ---------------------------------------------------------------------- Biometry  BPD:      44.1  mm     G. Age:  19w 2d         40  %    CI:        67.11   %    70 - 86                                                          FL/HC:      17.3   %    16.8 - 19.8  HC:      172.4  mm     G. Age:  19w 6d         54  %    HC/AC:      1.17        1.09 - 1.39  AC:      147.4  mm     G. Age:  20w 0d         60  %    FL/BPD:     67.8   %  FL:       29.9  mm     G. Age:  19w 2d         30  %    FL/AC:      20.3   %    20 - 24  HUM:      30.1  mm     G. Age:  20w 0d         61  %  CER:      18.4  mm     G. Age:  18w 1d         13  %  NFT:       5.7  mm  LV:        6.1  mm  CM:        4.6  mm  Est. FW:     305  gm    0 lb 11 oz      50  % ---------------------------------------------------------------------- OB History  Gravidity:    7         Term:   4         SAB:   1  Ectopic:      1        Living:  4 ---------------------------------------------------------------------- Gestational Age  LMP:           19w 4d        Date:  06/07/20                 EDD:   03/14/21  U/S Today:     19w 4d  EDD:   03/14/21  Best:          19w 4d     Det. By:  LMP  (06/07/20)          EDD:   03/14/21 ---------------------------------------------------------------------- Anatomy  Cranium:               Appears normal         LVOT:                   Not well visualized  Cavum:                 Appears normal         Aortic  Arch:            Appears normal  Ventricles:            Appears normal         Ductal Arch:            Appears normal  Choroid Plexus:        Appears normal         Diaphragm:              Not well visualized  Cerebellum:            Appears normal         Stomach:                Appears normal, left                                                                        sided  Posterior Fossa:       Appears normal         Abdomen:                Appears normal  Nuchal Fold:           Appears normal         Abdominal Wall:         Appears nml (cord                                                                        insert, abd wall)  Face:                  Appears normal         Cord Vessels:           Appears normal (3                         (orbits and profile)                           vessel cord)  Lips:                  Appears normal         Kidneys:  Appear normal  Palate:                Appears normal         Bladder:                Appears normal  Thoracic:              Appears normal         Spine:                  Not well visualized                         Appears normal  Heart:                 Appears normal         Upper Extremities:      Appears normal                         (4CH, axis, and                         situs)  RVOT:                  Not well visualized    Lower Extremities:      Appears normal  Other:  Fetus appears to be female. Nasal bone visualized. Technically          difficult due to maternal habitus and fetal position. ---------------------------------------------------------------------- Cervix Uterus Adnexa  Cervix  Length:           3.66  cm.  Normal appearance by transabdominal scan.  Uterus  No abnormality visualized.  Right Ovary  Within normal limits.  Left Ovary  Within normal limits.  Cul De Sac  No free fluid seen.  Adnexa  No abnormality visualized. ---------------------------------------------------------------------- Comments  This patient was seen for  a detailed fetal anatomy scan due  to advanced maternal age and maternal obesity.  She has 3  prior cesarean deliveries.  Although it is indicated in her chart  that she has chronic kidney disease, the patient denies  having any kidney issues.  She denies any significant past medical history and denies  any problems in her current pregnancy.  She had a cell free DNA test earlier in her pregnancy which  indicated a low risk for trisomy 721, 918, and 13. A female fetus  is predicted.  She was informed that the fetal growth and amniotic fluid  level were appropriate for her gestational age.  There were no obvious fetal anomalies noted on today's  ultrasound exam.  However, today's exam was limited due to  the fetal position.  The patient was informed that anomalies may be missed due  to technical limitations. If the fetus is in a suboptimal position  or maternal habitus is increased, visualization of the fetus in  the maternal uterus may be impaired.  The increased risk of fetal aneuploidy due to advanced  maternal age was discussed. Due to advanced maternal age,  the patient was offered and declined an amniocentesis today  for definitive diagnosis of fetal aneuploidy.  A follow-up exam was scheduled in 4 weeks to complete the  views of the fetal anatomy. ----------------------------------------------------------------------                   Ma RingsVictor Fang, MD Electronically Signed Final Report  10/22/2020 10:05 am ----------------------------------------------------------------------   Assessment and Plan:  Pregnancy: [id number] at [redacted]w[redacted]d 1. Fatigue, unspecified type Will do labs to evaluate, will follow up results and manage accordingly. - TSH - Ferritin - Comprehensive metabolic panel - I29 and Folate Panel  2. Anemia during pregnancy in second trimester CBC Latest Ref Rng & Units 09/17/2020 07/19/2020 07/13/2019  WBC 3.4 - 10.8 x10E3/uL 6.9 6.6 4.7  Hemoglobin 11.1 - 15.9 g/dL 10.8(L) 7.4(L) 11.8(L)   Hematocrit 34.0 - 46.6 % 33.2(L) 26.1(L) 37.7  Platelets 150 - 450 x10E3/uL 377 462(H) 336  - Ferritin - B12 and Folate Panel  3. Status post repeat low transverse cesarean section x 3 Will need RCS  4. Obesity in pregnancy, antepartum TWG 17 lbs  5. [redacted] weeks gestation of pregnancy 6. Supervision of advanced maternal age high risk pregnancy, antepartum Low risk NIPS, incomplete anatomy scan today. Repeat scan ordered. AFP done today. - AFP, Serum, Open Spina Bifida Preterm labor symptoms and general obstetric precautions including but not limited to vaginal bleeding, contractions, leaking of fluid and fetal movement were reviewed in detail with the patient. Please refer to After Visit Summary for other counseling recommendations.   Return in about 4 weeks (around 11/19/2020) for OFFICE OB VISIT (MD only).  Future Appointments  Date Time Provider Department Center  11/22/2020  8:30 AM [facility] NURSE [facility]  11/22/2020  8:45 AM [facility] US4 [facility]    Jaynie Collins, MD

## 2020-10-24 LAB — AFP, SERUM, OPEN SPINA BIFIDA
AFP MoM: 1.57
AFP Value: 76 ng/mL
Gest. Age on Collection Date: 19.6 weeks
Maternal Age At EDD: 35.9 yr
OSBR Risk 1 IN: 4541
Test Results:: NEGATIVE
Weight: 228 [lb_av]

## 2020-10-24 LAB — COMPREHENSIVE METABOLIC PANEL
ALT: 8 IU/L (ref 0–32)
AST: 11 IU/L (ref 0–40)
Albumin/Globulin Ratio: 1.4 (ref 1.2–2.2)
Albumin: 3.9 g/dL (ref 3.8–4.8)
Alkaline Phosphatase: 47 IU/L (ref 44–121)
BUN/Creatinine Ratio: 12 (ref 9–23)
BUN: 5 mg/dL — ABNORMAL LOW (ref 6–20)
Bilirubin Total: <0.2 mg/dL (ref 0.0–1.2)
CO2: 19 mmol/L — ABNORMAL LOW (ref 20–29)
Calcium: 9.3 mg/dL (ref 8.7–10.2)
Chloride: 101 mmol/L (ref 96–106)
Creatinine, Ser: 0.43 mg/dL — ABNORMAL LOW (ref 0.57–1.00)
Globulin, Total: 2.8 g/dL (ref 1.5–4.5)
Glucose: 83 mg/dL (ref 65–99)
Potassium: 4.3 mmol/L (ref 3.5–5.2)
Sodium: 137 mmol/L (ref 134–144)
Total Protein: 6.7 g/dL (ref 6.0–8.5)
eGFR: 130 mL/min/{1.73_m2} (ref >59)

## 2020-10-24 LAB — FERRITIN: Ferritin: 32 ng/mL (ref 15–150)

## 2020-10-24 LAB — B12 AND FOLATE PANEL
Folate: 14.2 ng/mL (ref >3.0)
Vitamin B-12: 253 pg/mL (ref 232–1245)

## 2020-10-24 LAB — TSH: TSH: 1.44 u[IU]/mL (ref 0.450–4.500)

## 2020-11-19 ENCOUNTER — Encounter: Payer: Self-pay | Admitting: Family Medicine

## 2020-11-19 ENCOUNTER — Ambulatory Visit: Payer: Medicaid Other | Attending: Obstetrics

## 2020-11-19 ENCOUNTER — Other Ambulatory Visit: Payer: Self-pay | Admitting: *Deleted

## 2020-11-19 ENCOUNTER — Ambulatory Visit (INDEPENDENT_AMBULATORY_CARE_PROVIDER_SITE_OTHER): Payer: Medicaid Other | Admitting: Family Medicine

## 2020-11-19 ENCOUNTER — Encounter: Payer: Self-pay | Admitting: *Deleted

## 2020-11-19 ENCOUNTER — Other Ambulatory Visit: Payer: Self-pay

## 2020-11-19 ENCOUNTER — Ambulatory Visit: Payer: Medicaid Other | Admitting: *Deleted

## 2020-11-19 VITALS — BP 112/77 | HR 104 | Wt 231.6 lb

## 2020-11-19 DIAGNOSIS — O09529 Supervision of elderly multigravida, unspecified trimester: Secondary | ICD-10-CM

## 2020-11-19 DIAGNOSIS — O34219 Maternal care for unspecified type scar from previous cesarean delivery: Secondary | ICD-10-CM | POA: Diagnosis not present

## 2020-11-19 DIAGNOSIS — O09522 Supervision of elderly multigravida, second trimester: Secondary | ICD-10-CM | POA: Insufficient documentation

## 2020-11-19 DIAGNOSIS — O99212 Obesity complicating pregnancy, second trimester: Secondary | ICD-10-CM | POA: Diagnosis not present

## 2020-11-19 DIAGNOSIS — O99012 Anemia complicating pregnancy, second trimester: Secondary | ICD-10-CM

## 2020-11-19 DIAGNOSIS — Z3A23 23 weeks gestation of pregnancy: Secondary | ICD-10-CM

## 2020-11-19 DIAGNOSIS — E669 Obesity, unspecified: Secondary | ICD-10-CM

## 2020-11-19 DIAGNOSIS — O09292 Supervision of pregnancy with other poor reproductive or obstetric history, second trimester: Secondary | ICD-10-CM

## 2020-11-19 DIAGNOSIS — O99210 Obesity complicating pregnancy, unspecified trimester: Secondary | ICD-10-CM | POA: Insufficient documentation

## 2020-11-19 DIAGNOSIS — Z98891 History of uterine scar from previous surgery: Secondary | ICD-10-CM

## 2020-11-19 MED ORDER — VITAMIN B-12 1000 MCG PO TABS: 1000.0000 ug | ORAL_TABLET | Freq: Every day | ORAL | 5 refills | Status: DC

## 2020-11-19 NOTE — Progress Notes (Signed)
   Subjective:  Nicole Landry is a 35 y.o. [phone] at [redacted]w[redacted]d being seen today for ongoing prenatal care.  She is currently monitored for the following issues for this low-risk pregnancy and has Status post repeat low transverse cesarean section; Encounter for supervision of high-risk pregnancy with multigravida of advanced maternal age; Abnormal findings on diagnostic imaging of other specified body structures; Anemia of pregnancy; and Obesity in pregnancy, antepartum on their problem list.  Patient reports fatigue.  Contractions: Irritability. Vag. Bleeding: None.  Movement: Present. Denies leaking of fluid.   The following portions of the patient's history were reviewed and updated as appropriate: allergies, current medications, past family history, past medical history, past social history, past surgical history and problem list. Problem list updated.  Objective:   Vitals:   11/19/20 0948  BP: 112/77  Pulse: (!) 104  Weight: 231 lb 9.6 oz (105.1 kg)    Fetal Status: Fetal Heart Rate (bpm): 144   Movement: Present     General:  Alert, oriented and cooperative. Patient is in no acute distress.  Skin: Skin is warm and dry. No rash noted.   Cardiovascular: Normal heart rate noted  Respiratory: Normal respiratory effort, no problems with respiration noted  Abdomen: Soft, gravid, appropriate for gestational age. Pain/Pressure: Absent     Pelvic: Vag. Bleeding: None     Cervical exam deferred        Extremities: Normal range of motion.  Edema: None  Mental Status: Normal mood and affect. Normal behavior. Normal judgment and thought content.   Urinalysis:      Assessment and Plan:  Pregnancy: [id number] at [redacted]w[redacted]d  1. Encounter for supervision of high-risk pregnancy with multigravida of advanced maternal age BP and FHR normal Discussed 28wk labs, needs to come fasting next visit  2. Status post repeat low transverse cesarean section X3, RCS at 39 weeks  3. Anemia during pregnancy  in second trimester Still feeling fatigued Extensive labs from last visit unremarkable, though B12 is very low end of normal, will start supplementation  4. Obesity in pregnancy, antepartum   Preterm labor symptoms and general obstetric precautions including but not limited to vaginal bleeding, contractions, leaking of fluid and fetal movement were reviewed in detail with the patient. Please refer to After Visit Summary for other counseling recommendations.  Return in 4 weeks (on 12/17/2020) for ob visit, 28 wk labs.   Venora Maples, MD

## 2020-11-19 NOTE — Patient Instructions (Signed)
 Contraception Choices Contraception, also called birth control, refers to methods or devices that prevent pregnancy. Hormonal methods Contraceptive implant A contraceptive implant is a thin, plastic tube that contains a hormone that prevents pregnancy. It is different from an intrauterine device (IUD). It is inserted into the upper part of the arm by a health care provider. Implants can be effective for up to 3 years. Progestin-only injections Progestin-only injections are injections of progestin, a synthetic form of the hormone progesterone. They are given every 3 months by a health care provider. Birth control pills Birth control pills are pills that contain hormones that prevent pregnancy. They must be taken once a day, preferably at the same time each day. A prescription is needed to use this method of contraception. Birth control patch The birth control patch contains hormones that prevent pregnancy. It is placed on the skin and must be changed once a week for three weeks and removed on the fourth week. A prescription is needed to use this method of contraception. Vaginal ring A vaginal ring contains hormones that prevent pregnancy. It is placed in the vagina for three weeks and removed on the fourth week. After that, the process is repeated with a new ring. A prescription is needed to use this method of contraception. Emergency contraceptive Emergency contraceptives prevent pregnancy after unprotected sex. They come in pill form and can be taken up to 5 days after sex. They work best the sooner they are taken after having sex. Most emergency contraceptives are available without a prescription. This method should not be used as your only form of birth control.   Barrier methods Female condom A female condom is a thin sheath that is worn over the penis during sex. Condoms keep sperm from going inside a woman's body. They can be used with a sperm-killing substance (spermicide) to increase their  effectiveness. They should be thrown away after one use. Female condom A female condom is a soft, loose-fitting sheath that is put into the vagina before sex. The condom keeps sperm from going inside a woman's body. They should be thrown away after one use. Diaphragm A diaphragm is a soft, dome-shaped barrier. It is inserted into the vagina before sex, along with a spermicide. The diaphragm blocks sperm from entering the uterus, and the spermicide kills sperm. A diaphragm should be left in the vagina for 6-8 hours after sex and removed within 24 hours. A diaphragm is prescribed and fitted by a health care provider. A diaphragm should be replaced every 1-2 years, after giving birth, after gaining more than 15 lb (6.8 kg), and after pelvic surgery. Cervical cap A cervical cap is a round, soft latex or plastic cup that fits over the cervix. It is inserted into the vagina before sex, along with spermicide. It blocks sperm from entering the uterus. The cap should be left in place for 6-8 hours after sex and removed within 48 hours. A cervical cap must be prescribed and fitted by a health care provider. It should be replaced every 2 years. Sponge A sponge is a soft, circular piece of polyurethane foam with spermicide in it. The sponge helps block sperm from entering the uterus, and the spermicide kills sperm. To use it, you make it wet and then insert it into the vagina. It should be inserted before sex, left in for at least 6 hours after sex, and removed and thrown away within 30 hours. Spermicides Spermicides are chemicals that kill or block sperm from entering the   cervix and uterus. They can come as a cream, jelly, suppository, foam, or tablet. A spermicide should be inserted into the vagina with an applicator at least 10-15 minutes before sex to allow time for it to work. The process must be repeated every time you have sex. Spermicides do not require a prescription.   Intrauterine  contraception Intrauterine device (IUD) An IUD is a T-shaped device that is put in a woman's uterus. There are two types:  Hormone IUD.This type contains progestin, a synthetic form of the hormone progesterone. This type can stay in place for 3-5 years.  Copper IUD.This type is wrapped in copper wire. It can stay in place for 10 years. Permanent methods of contraception Female tubal ligation In this method, a woman's fallopian tubes are sealed, tied, or blocked during surgery to prevent eggs from traveling to the uterus. Hysteroscopic sterilization In this method, a small, flexible insert is placed into each fallopian tube. The inserts cause scar tissue to form in the fallopian tubes and block them, so sperm cannot reach an egg. The procedure takes about 3 months to be effective. Another form of birth control must be used during those 3 months. Female sterilization This is a procedure to tie off the tubes that carry sperm (vasectomy). After the procedure, the man can still ejaculate fluid (semen). Another form of birth control must be used for 3 months after the procedure. Natural planning methods Natural family planning In this method, a couple does not have sex on days when the woman could become pregnant. Calendar method In this method, the woman keeps track of the length of each menstrual cycle, identifies the days when pregnancy can happen, and does not have sex on those days. Ovulation method In this method, a couple avoids sex during ovulation. Symptothermal method This method involves not having sex during ovulation. The woman typically checks for ovulation by watching changes in her temperature and in the consistency of cervical mucus. Post-ovulation method In this method, a couple waits to have sex until after ovulation. Where to find more information  Centers for Disease Control and Prevention: www.cdc.gov Summary  Contraception, also called birth control, refers to methods or  devices that prevent pregnancy.  Hormonal methods of contraception include implants, injections, pills, patches, vaginal rings, and emergency contraceptives.  Barrier methods of contraception can include female condoms, female condoms, diaphragms, cervical caps, sponges, and spermicides.  There are two types of IUDs (intrauterine devices). An IUD can be put in a woman's uterus to prevent pregnancy for 3-5 years.  Permanent sterilization can be done through a procedure for males and females. Natural family planning methods involve nothaving sex on days when the woman could become pregnant. This information is not intended to replace advice given to you by your health care provider. Make sure you discuss any questions you have with your health care provider. Document Revised: 11/27/2019 Document Reviewed: 11/27/2019 Elsevier Patient Education  2021 [employer]   Breastfeeding  Choosing to breastfeed is one of the best decisions you can make for yourself and your baby. A change in hormones during pregnancy causes your breasts to make breast milk in your milk-producing glands. Hormones prevent breast milk from being released before your baby is born. They also prompt milk flow after birth. Once breastfeeding has begun, thoughts of your baby, as well as his or her sucking or crying, can stimulate the release of milk from your milk-producing glands. Benefits of breastfeeding Research shows that breastfeeding offers many health benefits   for infants and mothers. It also offers a cost-free and convenient way to feed your baby. For your baby  Your first milk (colostrum) helps your baby's digestive system to function better.  Special cells in your milk (antibodies) help your baby to fight off infections.  Breastfed babies are less likely to develop asthma, allergies, obesity, or type 2 diabetes. They are also at lower risk for sudden infant death syndrome (SIDS).  Nutrients in breast milk are better  able to meet your baby's needs compared to infant formula.  Breast milk improves your baby's brain development. For you  Breastfeeding helps to create a very special bond between you and your baby.  Breastfeeding is convenient. Breast milk costs nothing and is always available at the correct temperature.  Breastfeeding helps to burn calories. It helps you to lose the weight that you gained during pregnancy.  Breastfeeding makes your uterus return faster to its size before pregnancy. It also slows bleeding (lochia) after you give birth.  Breastfeeding helps to lower your risk of developing type 2 diabetes, osteoporosis, rheumatoid arthritis, cardiovascular disease, and breast, ovarian, uterine, and endometrial cancer later in life. Breastfeeding basics Starting breastfeeding  Find a comfortable place to sit or lie down, with your neck and back well-supported.  Place a pillow or a rolled-up blanket under your baby to bring him or her to the level of your breast (if you are seated). Nursing pillows are specially designed to help support your arms and your baby while you breastfeed.  Make sure that your baby's tummy (abdomen) is facing your abdomen.  Gently massage your breast. With your fingertips, massage from the outer edges of your breast inward toward the nipple. This encourages milk flow. If your milk flows slowly, you may need to continue this action during the feeding.  Support your breast with 4 fingers underneath and your thumb above your nipple (make the letter "C" with your hand). Make sure your fingers are well away from your nipple and your baby's mouth.  Stroke your baby's lips gently with your finger or nipple.  When your baby's mouth is open wide enough, quickly bring your baby to your breast, placing your entire nipple and as much of the areola as possible into your baby's mouth. The areola is the colored area around your nipple. ? More areola should be visible above your  baby's upper lip than below the lower lip. ? Your baby's lips should be opened and extended outward (flanged) to ensure an adequate, comfortable latch. ? Your baby's tongue should be between his or her lower gum and your breast.  Make sure that your baby's mouth is correctly positioned around your nipple (latched). Your baby's lips should create a seal on your breast and be turned out (everted).  It is common for your baby to suck about 2-3 minutes in order to start the flow of breast milk. Latching Teaching your baby how to latch onto your breast properly is very important. An improper latch can cause nipple pain, decreased milk supply, and poor weight gain in your baby. Also, if your baby is not latched onto your nipple properly, he or she may swallow some air during feeding. This can make your baby fussy. Burping your baby when you switch breasts during the feeding can help to get rid of the air. However, teaching your baby to latch on properly is still the best way to prevent fussiness from swallowing air while breastfeeding. Signs that your baby has successfully latched onto   your nipple  Silent tugging or silent sucking, without causing you pain. Infant's lips should be extended outward (flanged).  Swallowing heard between every 3-4 sucks once your milk has started to flow (after your let-down milk reflex occurs).  Muscle movement above and in front of his or her ears while sucking. Signs that your baby has not successfully latched onto your nipple  Sucking sounds or smacking sounds from your baby while breastfeeding.  Nipple pain. If you think your baby has not latched on correctly, slip your finger into the corner of your baby's mouth to break the suction and place it between your baby's gums. Attempt to start breastfeeding again. Signs of successful breastfeeding Signs from your baby  Your baby will gradually decrease the number of sucks or will completely stop sucking.  Your baby  will fall asleep.  Your baby's body will relax.  Your baby will retain a small amount of milk in his or her mouth.  Your baby will let go of your breast by himself or herself. Signs from you  Breasts that have increased in firmness, weight, and size 1-3 hours after feeding.  Breasts that are softer immediately after breastfeeding.  Increased milk volume, as well as a change in milk consistency and color by the fifth day of breastfeeding.  Nipples that are not sore, cracked, or bleeding. Signs that your baby is getting enough milk  Wetting at least 1-2 diapers during the first 24 hours after birth.  Wetting at least 5-6 diapers every 24 hours for the first week after birth. The urine should be clear or pale yellow by the age of 5 days.  Wetting 6-8 diapers every 24 hours as your baby continues to grow and develop.  At least 3 stools in a 24-hour period by the age of 5 days. The stool should be soft and yellow.  At least 3 stools in a 24-hour period by the age of 7 days. The stool should be seedy and yellow.  No loss of weight greater than 10% of birth weight during the first 3 days of life.  Average weight gain of 4-7 oz (113-198 g) per week after the age of 4 days.  Consistent daily weight gain by the age of 5 days, without weight loss after the age of 2 weeks. After a feeding, your baby may spit up a small amount of milk. This is normal. Breastfeeding frequency and duration Frequent feeding will help you make more milk and can prevent sore nipples and extremely full breasts (breast engorgement). Breastfeed when you feel the need to reduce the fullness of your breasts or when your baby shows signs of hunger. This is called "breastfeeding on demand." Signs that your baby is hungry include:  Increased alertness, activity, or restlessness.  Movement of the head from side to side.  Opening of the mouth when the corner of the mouth or cheek is stroked (rooting).  Increased  sucking sounds, smacking lips, cooing, sighing, or squeaking.  Hand-to-mouth movements and sucking on fingers or hands.  Fussing or crying. Avoid introducing a pacifier to your baby in the first 4-6 weeks after your baby is born. After this time, you may choose to use a pacifier. Research has shown that pacifier use during the first year of a baby's life decreases the risk of sudden infant death syndrome (SIDS). Allow your baby to feed on each breast as long as he or she wants. When your baby unlatches or falls asleep while feeding from the   first breast, offer the second breast. Because newborns are often sleepy in the first few weeks of life, you may need to awaken your baby to get him or her to feed. Breastfeeding times will vary from baby to baby. However, the following rules can serve as a guide to help you make sure that your baby is properly fed:  Newborns (babies 4 weeks of age or younger) may breastfeed every 1-3 hours.  Newborns should not go without breastfeeding for longer than 3 hours during the day or 5 hours during the night.  You should breastfeed your baby a minimum of 8 times in a 24-hour period. Breast milk pumping Pumping and storing breast milk allows you to make sure that your baby is exclusively fed your breast milk, even at times when you are unable to breastfeed. This is especially important if you go back to work while you are still breastfeeding, or if you are not able to be present during feedings. Your lactation consultant can help you find a method of pumping that works best for you and give you guidelines about how long it is safe to store breast milk.      Caring for your breasts while you breastfeed Nipples can become dry, cracked, and sore while breastfeeding. The following recommendations can help keep your breasts moisturized and healthy:  Avoid using soap on your nipples.  Wear a supportive bra designed especially for nursing. Avoid wearing underwire-style  bras or extremely tight bras (sports bras).  Air-dry your nipples for 3-4 minutes after each feeding.  Use only cotton bra pads to absorb leaked breast milk. Leaking of breast milk between feedings is normal.  Use lanolin on your nipples after breastfeeding. Lanolin helps to maintain your skin's normal moisture barrier. Pure lanolin is not harmful (not toxic) to your baby. You may also hand express a few drops of breast milk and gently massage that milk into your nipples and allow the milk to air-dry. In the first few weeks after giving birth, some women experience breast engorgement. Engorgement can make your breasts feel heavy, warm, and tender to the touch. Engorgement peaks within 3-5 days after you give birth. The following recommendations can help to ease engorgement:  Completely empty your breasts while breastfeeding or pumping. You may want to start by applying warm, moist heat (in the shower or with warm, water-soaked hand towels) just before feeding or pumping. This increases circulation and helps the milk flow. If your baby does not completely empty your breasts while breastfeeding, pump any extra milk after he or she is finished.  Apply ice packs to your breasts immediately after breastfeeding or pumping, unless this is too uncomfortable for you. To do this: ? Put ice in a plastic bag. ? Place a towel between your skin and the bag. ? Leave the ice on for 20 minutes, 2-3 times a day.  Make sure that your baby is latched on and positioned properly while breastfeeding. If engorgement persists after 48 hours of following these recommendations, contact your health care provider or a lactation consultant. Overall health care recommendations while breastfeeding  Eat 3 healthy meals and 3 snacks every day. Well-nourished mothers who are breastfeeding need an additional 450-500 calories a day. You can meet this requirement by increasing the amount of a balanced diet that you eat.  Drink  enough water to keep your urine pale yellow or clear.  Rest often, relax, and continue to take your prenatal vitamins to prevent fatigue, stress, and low   vitamin and mineral levels in your body (nutrient deficiencies).  Do not use any products that contain nicotine or tobacco, such as cigarettes and e-cigarettes. Your baby may be harmed by chemicals from cigarettes that pass into breast milk and exposure to secondhand smoke. If you need help quitting, ask your health care provider.  Avoid alcohol.  Do not use illegal drugs or marijuana.  Talk with your health care provider before taking any medicines. These include over-the-counter and prescription medicines as well as vitamins and herbal supplements. Some medicines that may be harmful to your baby can pass through breast milk.  It is possible to become pregnant while breastfeeding. If birth control is desired, ask your health care provider about options that will be safe while breastfeeding your baby. Where to find more information: [employer]: www.llli.org Contact a health care provider if:  You feel like you want to stop breastfeeding or have become frustrated with breastfeeding.  Your nipples are cracked or bleeding.  Your breasts are red, tender, or warm.  You have: ? Painful breasts or nipples. ? A swollen area on either breast. ? A fever or chills. ? Nausea or vomiting. ? Drainage other than breast milk from your nipples.  Your breasts do not become full before feedings by the fifth day after you give birth.  You feel sad and depressed.  Your baby is: ? Too sleepy to eat well. ? Having trouble sleeping. ? More than 1 week old and wetting fewer than 6 diapers in a 24-hour period. ? Not gaining weight by 5 days of age.  Your baby has fewer than 3 stools in a 24-hour period.  Your baby's skin or the white parts of his or her eyes become yellow. Get help right away if:  Your baby is overly tired  (lethargic) and does not want to wake up and feed.  Your baby develops an unexplained fever. Summary  Breastfeeding offers many health benefits for infant and mothers.  Try to breastfeed your infant when he or she shows early signs of hunger.  Gently tickle or stroke your baby's lips with your finger or nipple to allow the baby to open his or her mouth. Bring the baby to your breast. Make sure that much of the areola is in your baby's mouth. Offer one side and burp the baby before you offer the other side.  Talk with your health care provider or lactation consultant if you have questions or you face problems as you breastfeed. This information is not intended to replace advice given to you by your health care provider. Make sure you discuss any questions you have with your health care provider. Document Revised: 09/16/2017 Document Reviewed: 07/24/2016 Elsevier Patient Education  2021 [employer]  

## 2020-11-22 ENCOUNTER — Ambulatory Visit: Payer: Medicaid Other

## 2020-12-19 ENCOUNTER — Telehealth: Payer: Self-pay | Admitting: Clinical

## 2020-12-19 NOTE — Telephone Encounter (Signed)
Follow up with pt after last missed appointment; pt is encouraged to keep taking prenatal vitamins throughout remaining pregnancy and until postpartum check up with medical provider, to help with fatigue d/t low hemoglobin. Pt is given direct number, [phone], to call [facility] as needed throughout the pregnancy/early postpartum as needed; pt agrees to call if needed for any additional follow up appointments.

## 2020-12-20 ENCOUNTER — Other Ambulatory Visit: Payer: Self-pay

## 2020-12-20 ENCOUNTER — Other Ambulatory Visit: Payer: Medicaid Other

## 2020-12-20 ENCOUNTER — Ambulatory Visit (INDEPENDENT_AMBULATORY_CARE_PROVIDER_SITE_OTHER): Payer: Medicaid Other | Admitting: Medical

## 2020-12-20 VITALS — BP 120/74 | HR 114 | Wt 238.2 lb

## 2020-12-20 DIAGNOSIS — O09529 Supervision of elderly multigravida, unspecified trimester: Secondary | ICD-10-CM

## 2020-12-20 DIAGNOSIS — Z98891 History of uterine scar from previous surgery: Secondary | ICD-10-CM

## 2020-12-20 DIAGNOSIS — Z3A28 28 weeks gestation of pregnancy: Secondary | ICD-10-CM

## 2020-12-20 DIAGNOSIS — F419 Anxiety disorder, unspecified: Secondary | ICD-10-CM

## 2020-12-20 DIAGNOSIS — O99210 Obesity complicating pregnancy, unspecified trimester: Secondary | ICD-10-CM

## 2020-12-20 NOTE — Progress Notes (Signed)
   PRENATAL VISIT NOTE  Subjective:  Nicole Landry is a 35 y.o. [phone] at [redacted]w[redacted]d being seen today for ongoing prenatal care.  She is currently monitored for the following issues for this high-risk pregnancy and has Status post repeat low transverse cesarean section; Encounter for supervision of high-risk pregnancy with multigravida of advanced maternal age; Abnormal findings on diagnostic imaging of other specified body structures; Anemia of pregnancy; and Obesity in pregnancy, antepartum on their problem list.  Patient reports no complaints.  Contractions: Irritability. Vag. Bleeding: None.  Movement: Present. Denies leaking of fluid.   The following portions of the patient's history were reviewed and updated as appropriate: allergies, current medications, past family history, past medical history, past social history, past surgical history and problem list.   Objective:   Vitals:   12/20/20 0849  BP: 120/74  Pulse: (!) 114  Weight: 238 lb 3.2 oz (108 kg)    Fetal Status: Fetal Heart Rate (bpm): 153   Movement: Present     General:  Alert, oriented and cooperative. Patient is in no acute distress.  Skin: Skin is warm and dry. No rash noted.   Cardiovascular: Normal heart rate noted  Respiratory: Normal respiratory effort, no problems with respiration noted  Abdomen: Soft, gravid, appropriate for gestational age.  Pain/Pressure: Absent     Pelvic: Cervical exam deferred        Extremities: Normal range of motion.     Mental Status: Normal mood and affect. Normal behavior. Normal judgment and thought content.   Assessment and Plan:  Pregnancy: [id number] at [redacted]w[redacted]d 1. Encounter for supervision of high-risk pregnancy with multigravida of advanced maternal age - 2 hour GTT, CBC, HIV, RPR today  - Declined TDap - Planning IP Nexplanon for birth control   2. Anxiety - Has counselor outside [facility] and it is going well  3. Status post repeat low transverse cesarean section - Repeat  planned for 39 weeks, message sent to surgery scheduler today, patient would like to meet surgeon prior if possible   4. Obesity in pregnancy, antepartum - Discussed ideal weight gain for pregnancy - Patient concerned with pace of weight gain - Discussed small, frequent snacks and high protein  - Growth [place] scheduled 01/14/21  5. [redacted] weeks gestation of pregnancy  Preterm labor symptoms and general obstetric precautions including but not limited to vaginal bleeding, contractions, leaking of fluid and fetal movement were reviewed in detail with the patient. Please refer to After Visit Summary for other counseling recommendations.   Return in about 2 weeks (around 01/03/2021) for [facility] APP, Virtual.  Future Appointments  Date Time Provider Department Center  01/14/2021  7:45 AM [facility] NURSE [facility]  01/14/2021  8:00 AM [facility] US1 [facility]    Vonzella Nipple, PA-C

## 2020-12-20 NOTE — Progress Notes (Signed)
No questions or concerns

## 2020-12-21 LAB — CBC
Hematocrit: 32.8 % — ABNORMAL LOW (ref 34.0–46.6)
Hemoglobin: 10.6 g/dL — ABNORMAL LOW (ref 11.1–15.9)
MCH: 27.3 pg (ref 26.6–33.0)
MCHC: 32.3 g/dL (ref 31.5–35.7)
MCV: 85 fL (ref 79–97)
Platelets: 291 10*3/uL (ref 150–450)
RBC: 3.88 x10E6/uL (ref 3.77–5.28)
RDW: 13.5 % (ref 11.7–15.4)
WBC: 8.7 10*3/uL (ref 3.4–10.8)

## 2020-12-21 LAB — GLUCOSE TOLERANCE, 2 HOURS W/ 1HR
Glucose, 1 hour: 193 mg/dL — ABNORMAL HIGH (ref 65–179)
Glucose, 2 hour: 146 mg/dL (ref 65–152)
Glucose, Fasting: 104 mg/dL — ABNORMAL HIGH (ref 65–91)

## 2020-12-21 LAB — HIV ANTIBODY (ROUTINE TESTING W REFLEX): HIV Screen 4th Generation wRfx: NONREACTIVE

## 2020-12-21 LAB — RPR: RPR Ser Ql: NONREACTIVE

## 2020-12-23 ENCOUNTER — Encounter: Payer: Self-pay | Admitting: Medical

## 2020-12-23 ENCOUNTER — Other Ambulatory Visit: Payer: Self-pay | Admitting: Medical

## 2020-12-23 DIAGNOSIS — O24419 Gestational diabetes mellitus in pregnancy, unspecified control: Secondary | ICD-10-CM | POA: Insufficient documentation

## 2020-12-23 DIAGNOSIS — O24410 Gestational diabetes mellitus in pregnancy, diet controlled: Secondary | ICD-10-CM

## 2021-01-07 ENCOUNTER — Encounter: Payer: Self-pay | Admitting: *Deleted

## 2021-01-07 ENCOUNTER — Telehealth: Payer: Self-pay | Admitting: *Deleted

## 2021-01-07 NOTE — Telephone Encounter (Signed)
Call to patient. Left message on voice mail that procedure is scheduled for 03-07-21 at 0930, arrive 0730.  Left message letter being sent by mail and visible on My Chart. Can call back is any questions. Dr Charlotta Newton is surgeon and Raynelle Fanning will try to schedule her appt to meet her.   Encounter closed.

## 2021-01-09 ENCOUNTER — Encounter: Payer: Medicaid Other | Attending: Medical | Admitting: Registered"

## 2021-01-09 ENCOUNTER — Other Ambulatory Visit: Payer: Self-pay

## 2021-01-09 ENCOUNTER — Telehealth (INDEPENDENT_AMBULATORY_CARE_PROVIDER_SITE_OTHER): Payer: Medicaid Other | Admitting: Registered"

## 2021-01-09 DIAGNOSIS — O24410 Gestational diabetes mellitus in pregnancy, diet controlled: Secondary | ICD-10-CM

## 2021-01-09 DIAGNOSIS — Z3A00 Weeks of gestation of pregnancy not specified: Secondary | ICD-10-CM | POA: Diagnosis not present

## 2021-01-09 DIAGNOSIS — Z3A30 30 weeks gestation of pregnancy: Secondary | ICD-10-CM

## 2021-01-09 DIAGNOSIS — O24419 Gestational diabetes mellitus in pregnancy, unspecified control: Secondary | ICD-10-CM

## 2021-01-09 MED ORDER — ACCU-CHEK SOFTCLIX LANCETS MISC: 12 refills | Status: DC

## 2021-01-09 MED ORDER — GLUCOSE BLOOD VI STRP: ORAL_STRIP | 12 refills | Status: DC

## 2021-01-09 NOTE — Progress Notes (Signed)
Virtual Visit via Video Note  I connected with Nicole Landry on 01/09/21 at  9:15 AM EDT by a video enabled telemedicine application and verified that I am speaking with the correct person using two identifiers.  Location: Patient: home Provider: [facility]   Patient was seen for Gestational Diabetes self-management on 01/09/2021  Start time 0915 and End time 1000   Estimated due date: 03/14/21; [redacted]w[redacted]d  Clinical: Medications: reviewed Medical History: GDM A1, 5 yrs ago Labs: OGTT FBS, 1hr elevated, A1c 4.7% on 09/17/20  Dietary and Lifestyle History: Pt states she eats well, maybe ate too much fruit so she cut back on fruit, stopped eating bananas after GDM diagnosis.  Pt states she doesn't have time to count carbs because she is busy with kids doesn't think she has time to count carbs. 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  Pt states she does not eat enough because she gets busy.  Patient does not want to take medication because she hates the way all medication makes her feel.  Pt states she has started having tingling in left fingers x1/week. RD encouraged to let MD know  Physical Activity: Stress: very busy, not a lot of time to take care of self Sleep: less than 5 hours "not enough"  24 hr Recall: First Meal: Snack: Second meal: bagel OR fruit OR eggs, [place] bacon Snack: Third meal: shrimp, collard greens, mashed potatoes Snack: Beverages: (not enough) ~5 bottles flavored water, [place] green tea 26 g CHO, or sometimes cranberry juice   NUTRITION INTERVENTION  Nutrition education (E-1) on the following topics:   Initial Follow-up  [x]  []  Definition of Gestational Diabetes [x]  []  Why dietary management is important in controlling blood glucose [x]  []  Effects each nutrient has on blood glucose levels []  []  Simple carbohydrates vs complex carbohydrates [x]  []  Fluid  intake [x]  []  Creating a balanced meal plan [x]  []  Carbohydrate counting (Very Basic) [x]  []  When to check blood glucose levels []  []  Proper blood glucose monitoring techniques [x]  []  Effect of stress and stress reduction techniques  [x]  []  Exercise effect on blood glucose levels, appropriate exercise during pregnancy [x]  []  Importance of limiting caffeine and abstaining from alcohol and smoking [x]  []  Medications used for blood sugar control during pregnancy [x]  []  Hypoglycemia and rule of 15 [x]  []  Postpartum self care  Patient already has a meter, is testing pre breakfast and 2 hours after each meal. Patient reported sample readings FBS: 90-100 mg/dL Postprandial: mg/dL   Patient instructed to monitor glucose levels: FBS: 60 - ? 95 mg/dL (some clinics use 90 for cutoff) 1 hour: ? 140 mg/dL 2 hour: ? mg/dL  Patient received handouts via email: Nutrition Diabetes and Pregnancy Carbohydrate Counting List  Patient will be seen for follow-up as needed.   I discussed the limitations of evaluation and management by telemedicine and the availability of in person appointments. The patient expressed understanding and agreed to proceed.  Dr. managed it through diet 5 yrs ago.  I discussed the assessment and treatment plan with the patient. The patient was provided an opportunity to ask questions and all were answered. The patient agreed with the plan and demonstrated an understanding of the instructions.   The patient was advised to call back or seek an in-person evaluation if the symptoms worsen or if the condition fails to improve as anticipated.  I provided  45 minutes of non-face-to-face time during this encounter.   Heywood Bene, RD, LDN, CDCES

## 2021-01-10 ENCOUNTER — Telehealth (INDEPENDENT_AMBULATORY_CARE_PROVIDER_SITE_OTHER): Payer: Medicaid Other | Admitting: Obstetrics & Gynecology

## 2021-01-10 VITALS — BP 146/71 | HR 107

## 2021-01-10 DIAGNOSIS — O09523 Supervision of elderly multigravida, third trimester: Secondary | ICD-10-CM

## 2021-01-10 DIAGNOSIS — O09529 Supervision of elderly multigravida, unspecified trimester: Secondary | ICD-10-CM

## 2021-01-10 DIAGNOSIS — O99210 Obesity complicating pregnancy, unspecified trimester: Secondary | ICD-10-CM

## 2021-01-10 DIAGNOSIS — Z3A31 31 weeks gestation of pregnancy: Secondary | ICD-10-CM

## 2021-01-10 DIAGNOSIS — O99213 Obesity complicating pregnancy, third trimester: Secondary | ICD-10-CM

## 2021-01-10 DIAGNOSIS — E669 Obesity, unspecified: Secondary | ICD-10-CM

## 2021-01-10 DIAGNOSIS — O24419 Gestational diabetes mellitus in pregnancy, unspecified control: Secondary | ICD-10-CM

## 2021-01-10 NOTE — Progress Notes (Signed)
    TELEHEALTH OBSTETRICS VISIT ENCOUNTER NOTE  Provider location: Center for [facility]   Patient location: Home  I connected with Nicole Landry on 01/10/21 at  9:55 AM EDT by telephone at home and verified that I am speaking with the correct person using two identifiers. Of note, unable to do video encounter due to technical difficulties.    I discussed the limitations, risks, security and privacy concerns of performing an evaluation and management service by telephone and the availability of in person appointments. I also discussed with the patient that there may be a patient responsible charge related to this service. The patient expressed understanding and agreed to proceed.  Subjective:  Nicole Landry is a 35 y.o. [phone] at [redacted]w[redacted]d being followed for ongoing prenatal care.  She is currently monitored for the following issues for this high-risk pregnancy and has Status post repeat low transverse cesarean section; Encounter for supervision of high-risk pregnancy with multigravida of advanced maternal age; Abnormal findings on diagnostic imaging of other specified body structures; Anemia of pregnancy; Obesity in pregnancy, antepartum; and Gestational diabetes mellitus (GDM) in third trimester on their problem list.  Patient reports no complaints. Reports fetal movement. Denies any contractions, bleeding or leaking of fluid.   The following portions of the patient's history were reviewed and updated as appropriate: allergies, current medications, past family history, past medical history, past social history, past surgical history and problem list.   Objective:  Blood pressure (!) 146/71, pulse (!) 107, last menstrual period 06/07/2020, unknown if currently breastfeeding. General:  Alert, oriented and cooperative.   Mental Status: Normal mood and affect perceived. Normal judgment and thought content.  Rest of physical exam deferred due to type of  encounter  Assessment and Plan:  Pregnancy: [id number] at [redacted]w[redacted]d 1. Obesity in pregnancy, antepartum There is no height or weight on file to calculate BMI. 36.20 kg/m pregravid  2. Encounter for supervision of high-risk pregnancy with multigravida of advanced maternal age BP elevated and needs in person visit to evaluated, no severe features  3. Gestational diabetes mellitus (GDM) in third trimester, gestational diabetes method of control unspecified Diet controlled and just saw DM educator yesterday  Preterm labor symptoms and general obstetric precautions including but not limited to vaginal bleeding, contractions, leaking of fluid and fetal movement were reviewed in detail with the patient.  I discussed the assessment and treatment plan with the patient. The patient was provided an opportunity to ask questions and all were answered. The patient agreed with the plan and demonstrated an understanding of the instructions. The patient was advised to call back or seek an in-person office evaluation/go to MAU at [facility] for any urgent or concerning symptoms. Please refer to After Visit Summary for other counseling recommendations.   I provided 15 minutes of non-face-to-face time during this encounter.  Return in about 1 week (around 01/17/2021) for in person.  Future Appointments  Date Time Provider Department Center  01/14/2021  7:45 AM [facility] NURSE [facility]  01/14/2021  8:00 AM [facility] US1 [facility]  01/27/2021  3:30 PM Myna Hidalgo, DO [facility]    Scheryl Darter, MD Center for [facility], [facility]

## 2021-01-10 NOTE — Patient Instructions (Signed)
Hypertension During Pregnancy Hypertension is also called high blood pressure. High blood pressure means that the force of the blood moving in your body is high enough to cause problems for you and your baby. Different types of high blood pressure can happen during pregnancy. The types are: High blood pressure before you got pregnant. This is called chronic hypertension.  This can continue during your pregnancy. Your doctor will want to keep checking your blood pressure. You may need medicine to control your blood pressure while you are pregnant. You will need follow-up visits after you have your baby. High blood pressure that goes up during pregnancy when it was normal before. This is called gestational hypertension. It will often get better after you have your baby, but your doctor will need to watch your blood pressure to make sure that it is getting better. You may develop high blood pressure after giving birth. This is called postpartum hypertension. This often occurs within 48 hours after childbirth but may occur up to 6 weeks after giving birth. Very high blood pressure during pregnancy is an emergency that needs treatment right away. How does this affect me? If you have high blood pressure during pregnancy, you have a higher chance of developing high blood pressure: As you get older. If you get pregnant again. In some cases, high blood pressure during pregnancy can cause: Stroke. Heart attack. Damage to the kidneys, lungs, or liver. Preeclampsia. HELLP syndrome. Seizures. Problems with the placenta. How does this affect my baby? Your baby may: Be born early. Not weigh as much as he or she should. Not handle labor well, leading to a C-section. This condition may also result in a baby's death before birth (stillbirth). What are the risks? Having high blood pressure during a past pregnancy. Being overweight. Being age 35 or older. Being pregnant for the first time. Being pregnant  with more than one baby. Becoming pregnant using fertility methods, such as IVF. Having other problems, such as diabetes or kidney disease. What can I do to lower my risk?  Keep a healthy weight. Eat a healthy diet. Follow what your doctor tells you about treating any medical problems that you had before you got pregnant. It is very important to go to all of your doctor visits. Your doctor will check your blood pressure and make sure that your pregnancy is progressing as it should. Treatment should start early if a problem is found. How is this treated? Treatment for high blood pressure during pregnancy can vary. It depends on the type of high blood pressure you have and how serious it is. If you were taking medicine for your blood pressure before you got pregnant, talk with your doctor. You may need to change the medicine during pregnancy if it is not safe for your baby. If your blood pressure goes up during pregnancy, your doctor may order medicine to treat this. If you are at risk for preeclampsia, your doctor may tell you to take a low-dose aspirin while you are pregnant. If you have very high blood pressure, you may need to stay in the hospital so you and your baby can be watched closely. You may also need to take medicine to lower your blood pressure. In some cases, if your condition gets worse, you may need to have your baby early. Follow these instructions at home: Eating and drinking  Drink enough fluid to keep your pee (urine) pale yellow. Avoid caffeine. Lifestyle Do not smoke or use any products that contain   or tobacco. If you need help quitting, ask your doctor. Do not use alcohol or drugs. Avoid stress. Rest and get plenty of sleep. Regular exercise can help. Ask your doctor what kinds of exercise are best for you. General instructions Take over-the-counter and prescription medicines only as told by your doctor. Keep all prenatal and follow-up visits. Contact a  doctor if: You have symptoms that your doctor told you to watch for, such as: Headaches. A feeling like you may vomit (nausea). Vomiting. Belly (abdominal) pain. Feeling dizzy or light-headed. Get help right away if: You have symptoms of serious problems, such as: Very bad belly pain that does not get better with treatment. A very bad headache that does not get better. Blurry vision. Double vision. Vomiting that does not get better. Sudden, fast weight gain. Sudden swelling in your hands, ankles, or face. Bleeding from your vagina. Blood in your pee. Shortness of breath. Chest pain. Weakness on one side of your body. Trouble talking. Your baby is not moving as much as usual. These symptoms may be an emergency. Get help right away. Call your local emergency services (911 in the [place] Do not wait to see if the symptoms will go away. Do not drive yourself to the hospital. Summary High blood pressure is also called hypertension. High blood pressure means that the force of the blood moving in your body is high enough to cause problems for you and your baby. Get help right away if you have symptoms of serious problems due to high blood pressure. Keep all prenatal and follow-up visits. This information is not intended to replace advice given to you by your health care provider. Make sure you discuss any questions you have with your healthcare provider. Document Revised: 03/14/2020 Document Reviewed: 03/14/2020 Elsevier Patient Education  2022 [employer].

## 2021-01-14 ENCOUNTER — Other Ambulatory Visit: Payer: Self-pay

## 2021-01-14 ENCOUNTER — Ambulatory Visit: Payer: Medicaid Other | Attending: Obstetrics and Gynecology

## 2021-01-14 ENCOUNTER — Ambulatory Visit: Payer: Medicaid Other | Admitting: *Deleted

## 2021-01-14 ENCOUNTER — Other Ambulatory Visit: Payer: Self-pay | Admitting: *Deleted

## 2021-01-14 ENCOUNTER — Encounter: Payer: Self-pay | Admitting: *Deleted

## 2021-01-14 VITALS — BP 124/69 | HR 91

## 2021-01-14 DIAGNOSIS — Z3A31 31 weeks gestation of pregnancy: Secondary | ICD-10-CM

## 2021-01-14 DIAGNOSIS — E669 Obesity, unspecified: Secondary | ICD-10-CM

## 2021-01-14 DIAGNOSIS — O09523 Supervision of elderly multigravida, third trimester: Secondary | ICD-10-CM

## 2021-01-14 DIAGNOSIS — O99213 Obesity complicating pregnancy, third trimester: Secondary | ICD-10-CM | POA: Diagnosis not present

## 2021-01-14 DIAGNOSIS — O24410 Gestational diabetes mellitus in pregnancy, diet controlled: Secondary | ICD-10-CM

## 2021-01-14 DIAGNOSIS — O34219 Maternal care for unspecified type scar from previous cesarean delivery: Secondary | ICD-10-CM

## 2021-01-14 DIAGNOSIS — O09529 Supervision of elderly multigravida, unspecified trimester: Secondary | ICD-10-CM

## 2021-01-14 DIAGNOSIS — O99210 Obesity complicating pregnancy, unspecified trimester: Secondary | ICD-10-CM

## 2021-01-14 DIAGNOSIS — Z8632 Personal history of gestational diabetes: Secondary | ICD-10-CM

## 2021-01-17 ENCOUNTER — Other Ambulatory Visit: Payer: Self-pay

## 2021-01-17 NOTE — Addendum Note (Signed)
Addended by: Isabell Jarvis on: 01/17/2021 10:46 AM   Modules accepted: Orders

## 2021-01-27 ENCOUNTER — Encounter: Payer: Self-pay | Admitting: Obstetrics & Gynecology

## 2021-01-27 ENCOUNTER — Telehealth (INDEPENDENT_AMBULATORY_CARE_PROVIDER_SITE_OTHER): Payer: Medicaid Other | Admitting: Obstetrics & Gynecology

## 2021-01-27 VITALS — BP 114/68 | HR 96

## 2021-01-27 DIAGNOSIS — O0993 Supervision of high risk pregnancy, unspecified, third trimester: Secondary | ICD-10-CM

## 2021-01-27 DIAGNOSIS — Z3A33 33 weeks gestation of pregnancy: Secondary | ICD-10-CM

## 2021-01-27 DIAGNOSIS — O24410 Gestational diabetes mellitus in pregnancy, diet controlled: Secondary | ICD-10-CM

## 2021-01-27 DIAGNOSIS — O34219 Maternal care for unspecified type scar from previous cesarean delivery: Secondary | ICD-10-CM

## 2021-01-27 NOTE — Progress Notes (Signed)
TELEHEALTH VIRTUAL OBSTETRICS VISIT ENCOUNTER NOTE Patient name: Nicole Landry MRN [id number]  Date of birth: Feb 21, 1986  I connected with patient on 01/27/21 at  3:30 PM EDT by vidoe and verified that I am speaking with the correct person using two identifiers. Pt is not currently in our office, she is at home.  The provider is in the office.    I discussed the limitations, risks, security and privacy concerns of performing an evaluation and management service by telephone and the availability of in person appointments. I also discussed with the patient that there may be a patient responsible charge related to this service. The patient expressed understanding and agreed to proceed.  Chief Complaint:   Routine Prenatal Visit  History of Present Illness:   Nicole Landry is a 35 y.o. [phone] female at [redacted]w[redacted]d with an Estimated Date of Delivery: 03/14/21 being evaluated today for ongoing management of a high-risk pregnancy:  -GDM- diet controlled, per pt sugars wnl -h/o C-section x 3- plan for repeat  Depression screen [facility] 2/9 12/20/2020 11/26/2020 09/17/2020 09/10/2020 02/19/2016  Decreased Interest 2 1 1  0 0  Down, Depressed, Hopeless 1 1 2 1  0  PHQ - 2 Score 3 2 3 1  0  Altered sleeping 1 0 0 1 -  Tired, decreased energy 3 1 1 1  -  Change in appetite 0 2 0 0 -  Feeling bad or failure about yourself  0 0 1 1 -  Trouble concentrating 0 0 0 1 -  Moving slowly or fidgety/restless 0 1 1 0 -  Suicidal thoughts 0 0 0 0 -  PHQ-9 Score 7 6 6 5  -    Today she reports no complaints. Contractions: Irritability. Vag. Bleeding: None.  Movement: Present. denies leaking of fluid. Review of Systems:   Pertinent items are noted in HPI Denies abnormal vaginal discharge w/ itching/odor/irritation, headaches, visual changes, shortness of breath, chest pain, abdominal pain, severe nausea/vomiting, or problems with urination or bowel movements unless otherwise stated above. Pertinent History Reviewed:   Reviewed past medical,surgical, social, obstetrical and family history.  Reviewed problem list, medications and allergies. Physical Assessment:   Vitals:   01/27/21 1619  BP: 114/68  Pulse: 96  There is no height or weight on file to calculate BMI.        Physical Examination:   General:  Alert, oriented and cooperative.   Mental Status: Normal mood and affect perceived. Normal judgment and thought content.  Rest of physical exam deferred due to type of encounter  No results found for this or any previous visit (from the past 24 hour(s)).  Assessment & Plan:  1) Pregnancy at [redacted]w[redacted]d with an Estimated Date of Delivery: 03/14/21   2) GDMA1- controlled with diet 3) prior C-section x 3- discussed risk/benefit reviewed her concerns regarding upcoming procedures. Discussed contraceptive management- after discussion regarding Nexplanon vs IUD- would prefer IUD due to concern for irregular bleeding  Meds: No orders of the defined types were placed in this encounter.   Labs/procedures today: none  Plan:  Continue routine obstetrical care .  Does have home bp cuff.   Next visit: prefers in person    Reviewed: Preterm labor symptoms and general obstetric precautions including but not limited to vaginal bleeding, contractions, leaking of fluid and fetal movement were reviewed in detail with the patient. The patient was advised to call back or seek an in-person office evaluation/go to [facility]U at [facility] for any urgent or  concerning symptoms. All questions were answered. Please refer to After Visit Summary for other counseling recommendations.    I provided 15 minutes of non-face-to-face time during this encounter.  Follow-up: Return in about 2 weeks (around 02/10/2021) for LROB visit.  No orders of the defined types were placed in this encounter.  Myna Hidalgo, DO Attending Obstetrician & Gynecologist, [facility] for [employer], [facility]

## 2021-02-13 ENCOUNTER — Ambulatory Visit: Payer: Medicaid Other | Admitting: *Deleted

## 2021-02-13 ENCOUNTER — Other Ambulatory Visit: Payer: Self-pay | Admitting: Obstetrics and Gynecology

## 2021-02-13 ENCOUNTER — Encounter: Payer: Self-pay | Admitting: *Deleted

## 2021-02-13 ENCOUNTER — Other Ambulatory Visit (HOSPITAL_COMMUNITY)
Admission: RE | Admit: 2021-02-13 | Discharge: 2021-02-13 | Disposition: A | Discharge status: Home / Self Care | Payer: Medicaid Other | Source: Ambulatory Visit | Attending: Obstetrics & Gynecology | Admitting: Obstetrics & Gynecology

## 2021-02-13 ENCOUNTER — Ambulatory Visit: Payer: Medicaid Other | Attending: Maternal & Fetal Medicine

## 2021-02-13 ENCOUNTER — Ambulatory Visit (INDEPENDENT_AMBULATORY_CARE_PROVIDER_SITE_OTHER): Payer: Medicaid Other | Admitting: Obstetrics & Gynecology

## 2021-02-13 ENCOUNTER — Other Ambulatory Visit: Payer: Self-pay

## 2021-02-13 VITALS — BP 137/82 | HR 101

## 2021-02-13 VITALS — BP 125/82 | HR 99 | Wt 243.6 lb

## 2021-02-13 DIAGNOSIS — O24410 Gestational diabetes mellitus in pregnancy, diet controlled: Secondary | ICD-10-CM | POA: Diagnosis present

## 2021-02-13 DIAGNOSIS — O09523 Supervision of elderly multigravida, third trimester: Secondary | ICD-10-CM | POA: Diagnosis not present

## 2021-02-13 DIAGNOSIS — O99210 Obesity complicating pregnancy, unspecified trimester: Secondary | ICD-10-CM | POA: Diagnosis present

## 2021-02-13 DIAGNOSIS — O09529 Supervision of elderly multigravida, unspecified trimester: Secondary | ICD-10-CM | POA: Insufficient documentation

## 2021-02-13 DIAGNOSIS — Z3A35 35 weeks gestation of pregnancy: Secondary | ICD-10-CM

## 2021-02-13 DIAGNOSIS — O99213 Obesity complicating pregnancy, third trimester: Secondary | ICD-10-CM

## 2021-02-13 DIAGNOSIS — O34219 Maternal care for unspecified type scar from previous cesarean delivery: Secondary | ICD-10-CM | POA: Diagnosis not present

## 2021-02-13 DIAGNOSIS — O09293 Supervision of pregnancy with other poor reproductive or obstetric history, third trimester: Secondary | ICD-10-CM

## 2021-02-13 DIAGNOSIS — E669 Obesity, unspecified: Secondary | ICD-10-CM

## 2021-02-13 DIAGNOSIS — Z8632 Personal history of gestational diabetes: Secondary | ICD-10-CM

## 2021-02-13 NOTE — Progress Notes (Addendum)
Subjective:  Nicole Landry is a 35 y.o. [phone] at [redacted]w[redacted]d being seen today for prenatal care.  Patient reports  back pain and pelvic pressure .  Contractions: Irritability.  Vag. Bleeding: None. Movement: Present. Denies leaking of fluid.   The following portions of the patient's history were reviewed and updated as appropriate: allergies, current medications, past family history, past medical history, past social history, past surgical history and problem list.   Objective:   Vitals:   02/13/21 0850  BP: 125/82  Pulse: 99  Weight: 243 lb 9.6 oz (110.5 kg)    Fetal Status: Fetal Heart Rate (bpm): 153 Fundal Height: 37 cm Movement: Present     General:  Alert, oriented and cooperative. Patient is in no acute distress.  Skin: Skin is warm and dry. No rash noted.   Cardiovascular: Normal heart rate noted  Respiratory: Normal respiratory effort, no problems with respiration noted  Abdomen: Soft, gravid, appropriate for gestational age. Pain/Pressure: Present     Vaginal: Vag. Bleeding: None.       Cervix: Exam revealed closed and thick   Extremities: Normal range of motion.  Edema: Trace  Mental Status: Normal mood and affect. Normal behavior. Normal judgment and thought content.    Assessment and Plan:  Pregnancy: [id number] at [redacted]w[redacted]d  1. Encounter for supervision of high-risk pregnancy with multigravida of advanced maternal age - No LOF or vaginal bleeding, irritable contractions, good fetal movement   2. Obesity in pregnancy, antepartum  3. Diet controlled gestational diabetes mellitus (GDM) in third trimester - Patient reported fasting blood glucose is in the 80s and postprandial glucose in the 120s   4. [redacted] weeks gestation of pregnancy - GBS and GC/Chlamydia swabs collected today   Preterm labor symptoms and general obstetric precautions including but not limited to vaginal bleeding, contractions, leaking of fluid and fetal movement were reviewed in detail with the  patient. Please refer to After Visit Summary for other counseling recommendations.  Return in about 1 week (around 02/20/2021).   Athena Masse, Student-PA Attestation of Attending Supervision of PA Student: Evaluation and management procedures were performed by the medical student under my supervision and collaboration.  I have reviewed the student's note and chart, and I agree with the management and plan.  Scheryl Darter, MD, FACOG Attending Obstetrician & Gynecologist Faculty Practice, [facility]

## 2021-02-14 LAB — GC/CHLAMYDIA PROBE AMP (~~LOC~~) NOT AT ARMC
Chlamydia: NEGATIVE
Comment: NEGATIVE
Comment: NORMAL
Neisseria Gonorrhea: NEGATIVE

## 2021-02-16 LAB — CULTURE, BETA STREP (GROUP B ONLY): Strep Gp B Culture: POSITIVE — AB

## 2021-02-19 ENCOUNTER — Other Ambulatory Visit: Payer: Self-pay

## 2021-02-19 ENCOUNTER — Ambulatory Visit (INDEPENDENT_AMBULATORY_CARE_PROVIDER_SITE_OTHER): Payer: Medicaid Other | Admitting: Obstetrics and Gynecology

## 2021-02-19 ENCOUNTER — Encounter: Payer: Self-pay | Admitting: Obstetrics and Gynecology

## 2021-02-19 VITALS — BP 142/78 | HR 98 | Wt 241.2 lb

## 2021-02-19 DIAGNOSIS — O24410 Gestational diabetes mellitus in pregnancy, diet controlled: Secondary | ICD-10-CM

## 2021-02-19 DIAGNOSIS — Z98891 History of uterine scar from previous surgery: Secondary | ICD-10-CM

## 2021-02-19 DIAGNOSIS — Z3A36 36 weeks gestation of pregnancy: Secondary | ICD-10-CM

## 2021-02-19 DIAGNOSIS — A491 Streptococcal infection, unspecified site: Secondary | ICD-10-CM | POA: Insufficient documentation

## 2021-02-19 DIAGNOSIS — O09529 Supervision of elderly multigravida, unspecified trimester: Secondary | ICD-10-CM

## 2021-02-19 NOTE — Patient Instructions (Signed)
Cesarean Delivery, Care After This sheet gives you information about how to care for yourself after your procedure. Your health care provider may also give you more specific instructions. If you have problems or questions, contact your health careprovider. What can I expect after the procedure? After the procedure, it is common to have: A small amount of blood or clear fluid coming from the incision. Some redness, swelling, and pain in your incision area. Some abdominal pain and soreness. Vaginal bleeding (lochia). Even though you did not have a vaginal delivery, you will still have vaginal bleeding and discharge. Pelvic cramps. Fatigue. You may have pain, swelling, and discomfort in the tissue between your vagina and your anus (perineum) if: Your C-section was unplanned, and you were allowed to labor and push. An incision was made in the area (episiotomy) or the tissue tore during attempted vaginal delivery. Follow these instructions at home: Incision care  Follow instructions from your health care provider about how to take care of your incision. Make sure you: Wash your hands with soap and water before you change your bandage (dressing). If soap and water are not available, use hand sanitizer. If you have a dressing, change it or remove it as told by your health care provider. Leave stitches (sutures), skin staples, skin glue, or adhesive strips in place. These skin closures may need to stay in place for 2 weeks or longer. If adhesive strip edges start to loosen and curl up, you may trim the loose edges. Do not remove adhesive strips completely unless your health care provider tells you to do that. Check your incision area every day for signs of infection. Check for: More redness, swelling, or pain. More fluid or blood. Warmth. Pus or a bad smell. Do not take baths, swim, or use a hot tub until your health care provider says it's okay. Ask your health care provider if you can take  showers. When you cough or sneeze, hug a pillow. This helps with pain and decreases the chance of your incision opening up (dehiscing). Do this until your incision heals.  Medicines Take over-the-counter and prescription medicines only as told by your health care provider. If you were prescribed an antibiotic medicine, take it as told by your health care provider. Do not stop taking the antibiotic even if you start to feel better. Do not drive or use heavy machinery while taking prescription pain medicine. Lifestyle Do not drink alcohol. This is especially important if you are breastfeeding or taking pain medicine. Do not use any products that contain nicotine or tobacco, such as cigarettes, e-cigarettes, and chewing tobacco. If you need help quitting, ask your health care provider. Eating and drinking Drink at least 8 eight-ounce glasses of water every day unless told not to by your health care provider. If you breastfeed, you may need to drink even more water. Eat high-fiber foods every day. These foods may help prevent or relieve constipation. High-fiber foods include: Whole grain cereals and breads. Brown rice. Beans. Fresh fruits and vegetables. Activity  If possible, have someone help you care for your baby and help with household activities for at least a few days after you leave the hospital. Return to your normal activities as told by your health care provider. Ask your health care provider what activities are safe for you. Rest as much as possible. Try to rest or take a nap while your baby is sleeping. Do not lift anything that is heavier than 10 lbs (4.5 kg), or the   limit that you were told, until your health care provider says that it is safe. Talk with your health care provider about when you can engage in sexual activity. This may depend on your: Risk of infection. How fast you heal. Comfort and desire to engage in sexual activity.  General instructions Do not use tampons  or douches until your health care provider approves. Wear loose, comfortable clothing and a supportive and well-fitting bra. Keep your perineum clean and dry. Wipe from front to back when you use the toilet. If you pass a blood clot, save it and call your health care provider to discuss. Do not flush blood clots down the toilet before you get instructions from your health care provider. Keep all follow-up visits for you and your baby as told by your health care provider. This is important. Contact a health care provider if: You have: A fever. Bad-smelling vaginal discharge. Pus or a bad smell coming from your incision. Difficulty or pain when urinating. A sudden increase or decrease in the frequency of your bowel movements. More redness, swelling, or pain around your incision. More fluid or blood coming from your incision. A rash. Nausea. Little or no interest in activities you used to enjoy. Questions about caring for yourself or your baby. Your incision feels warm to the touch. Your breasts turn red or become painful or hard. You feel unusually sad or worried. You vomit. You pass a blood clot from your vagina. You urinate more than usual. You are dizzy or light-headed. Get help right away if: You have: Pain that does not go away or get better with medicine. Chest pain. Difficulty breathing. Blurred vision or spots in your vision. Thoughts about hurting yourself or your baby. New pain in your abdomen or in one of your legs. A severe headache. You faint. You bleed from your vagina so much that you fill more than one sanitary pad in one hour. Bleeding should not be heavier than your heaviest period. Summary After the procedure, it is common to have pain at your incision site, abdominal cramping, and slight bleeding from your vagina. Check your incision area every day for signs of infection. Tell your health care provider about any unusual symptoms. Keep all follow-up visits for  you and your baby as told by your health care provider. This information is not intended to replace advice given to you by your health care provider. Make sure you discuss any questions you have with your healthcare provider. Document Revised: 12/29/2017 Document Reviewed: 12/29/2017 Elsevier Patient Education  2022 [employer]  

## 2021-02-19 NOTE — Progress Notes (Signed)
Subjective:  Nicole Landry is a 35 y.o. [phone] at [redacted]w[redacted]d being seen today for ongoing prenatal care.  She is currently monitored for the following issues for this high-risk pregnancy and has Status post repeat low transverse cesarean section; Encounter for supervision of high-risk pregnancy with multigravida of advanced maternal age; Abnormal findings on diagnostic imaging of other specified body structures; Anemia of pregnancy; Obesity in pregnancy, antepartum; Gestational diabetes mellitus (GDM) in third trimester; and GBS (group B streptococcus) infection on their problem list.  Patient reports general discomforts of pregnancy.  Contractions: Irritability. Vag. Bleeding: None.  Movement: Present. Denies leaking of fluid.   The following portions of the patient's history were reviewed and updated as appropriate: allergies, current medications, past family history, past medical history, past social history, past surgical history and problem list. Problem list updated.  Objective:   Vitals:   02/19/21 0848  BP: (!) 142/78  Pulse: 98  Weight: 241 lb 3.2 oz (109.4 kg)    Fetal Status:     Movement: Present     General:  Alert, oriented and cooperative. Patient is in no acute distress.  Skin: Skin is warm and dry. No rash noted.   Cardiovascular: Normal heart rate noted  Respiratory: Normal respiratory effort, no problems with respiration noted  Abdomen: Soft, gravid, appropriate for gestational age. Pain/Pressure: Present     Pelvic:  Cervical exam deferred        Extremities: Normal range of motion.  Edema: Trace  Mental Status: Normal mood and affect. Normal behavior. Normal judgment and thought content.   Urinalysis:      Assessment and Plan:  Pregnancy: [id number] at [redacted]w[redacted]d  1. Encounter for supervision of high-risk pregnancy with multigravida of advanced maternal age Stable Labor precautions  2. Diet controlled gestational diabetes mellitus (GDM) in third trimester CBG's in  goal range per pt report Growth scan tomorrow Continue with diet   3. Status post repeat low transverse cesarean section For repeat   4. GBS (group B streptococcus) infection Tx with c section  Term labor symptoms and general obstetric precautions including but not limited to vaginal bleeding, contractions, leaking of fluid and fetal movement were reviewed in detail with the patient. Please refer to After Visit Summary for other counseling recommendations.  Return in about 1 week (around 02/26/2021) for OB visit, face to face, MD only.   Hermina Staggers, MD

## 2021-02-20 ENCOUNTER — Ambulatory Visit: Payer: Medicaid Other | Admitting: *Deleted

## 2021-02-20 ENCOUNTER — Other Ambulatory Visit: Payer: Self-pay

## 2021-02-20 ENCOUNTER — Ambulatory Visit (HOSPITAL_BASED_OUTPATIENT_CLINIC_OR_DEPARTMENT_OTHER): Payer: Medicaid Other

## 2021-02-20 ENCOUNTER — Other Ambulatory Visit: Payer: Self-pay | Admitting: Obstetrics and Gynecology

## 2021-02-20 ENCOUNTER — Inpatient Hospital Stay (EMERGENCY_DEPARTMENT_HOSPITAL)
Admission: AD | Admit: 2021-02-20 | Discharge: 2021-02-20 | Disposition: A | Discharge status: Home / Self Care | Payer: Medicaid Other | Source: Home / Self Care | Attending: Family Medicine | Admitting: Family Medicine

## 2021-02-20 ENCOUNTER — Encounter (HOSPITAL_COMMUNITY): Payer: Self-pay | Admitting: Family Medicine

## 2021-02-20 ENCOUNTER — Encounter: Payer: Self-pay | Admitting: *Deleted

## 2021-02-20 VITALS — BP 136/98 | HR 94

## 2021-02-20 DIAGNOSIS — N898 Other specified noninflammatory disorders of vagina: Secondary | ICD-10-CM | POA: Insufficient documentation

## 2021-02-20 DIAGNOSIS — O99213 Obesity complicating pregnancy, third trimester: Secondary | ICD-10-CM | POA: Insufficient documentation

## 2021-02-20 DIAGNOSIS — O24410 Gestational diabetes mellitus in pregnancy, diet controlled: Secondary | ICD-10-CM

## 2021-02-20 DIAGNOSIS — Z3A36 36 weeks gestation of pregnancy: Secondary | ICD-10-CM

## 2021-02-20 DIAGNOSIS — Z885 Allergy status to narcotic agent status: Secondary | ICD-10-CM | POA: Insufficient documentation

## 2021-02-20 DIAGNOSIS — Z8632 Personal history of gestational diabetes: Secondary | ICD-10-CM

## 2021-02-20 DIAGNOSIS — I10 Essential (primary) hypertension: Secondary | ICD-10-CM

## 2021-02-20 DIAGNOSIS — O09523 Supervision of elderly multigravida, third trimester: Secondary | ICD-10-CM

## 2021-02-20 DIAGNOSIS — E669 Obesity, unspecified: Secondary | ICD-10-CM

## 2021-02-20 DIAGNOSIS — O283 Abnormal ultrasonic finding on antenatal screening of mother: Secondary | ICD-10-CM

## 2021-02-20 DIAGNOSIS — O133 Gestational [pregnancy-induced] hypertension without significant proteinuria, third trimester: Secondary | ICD-10-CM

## 2021-02-20 DIAGNOSIS — O34219 Maternal care for unspecified type scar from previous cesarean delivery: Secondary | ICD-10-CM

## 2021-02-20 DIAGNOSIS — Z20822 Contact with and (suspected) exposure to covid-19: Secondary | ICD-10-CM | POA: Insufficient documentation

## 2021-02-20 DIAGNOSIS — O99210 Obesity complicating pregnancy, unspecified trimester: Secondary | ICD-10-CM | POA: Insufficient documentation

## 2021-02-20 DIAGNOSIS — O09529 Supervision of elderly multigravida, unspecified trimester: Secondary | ICD-10-CM | POA: Insufficient documentation

## 2021-02-20 DIAGNOSIS — O26893 Other specified pregnancy related conditions, third trimester: Secondary | ICD-10-CM

## 2021-02-20 DIAGNOSIS — R030 Elevated blood-pressure reading, without diagnosis of hypertension: Secondary | ICD-10-CM | POA: Insufficient documentation

## 2021-02-20 DIAGNOSIS — Z9079 Acquired absence of other genital organ(s): Secondary | ICD-10-CM | POA: Insufficient documentation

## 2021-02-20 DIAGNOSIS — O09293 Supervision of pregnancy with other poor reproductive or obstetric history, third trimester: Secondary | ICD-10-CM

## 2021-02-20 DIAGNOSIS — Z7982 Long term (current) use of aspirin: Secondary | ICD-10-CM | POA: Insufficient documentation

## 2021-02-20 LAB — COMPREHENSIVE METABOLIC PANEL
ALT: 10 U/L (ref 0–44)
AST: 19 U/L (ref 15–41)
Albumin: 2.9 g/dL — ABNORMAL LOW (ref 3.5–5.0)
Alkaline Phosphatase: 85 U/L (ref 38–126)
Anion gap: 10 (ref 5–15)
BUN: <5 mg/dL — ABNORMAL LOW (ref 6–20)
CO2: 22 mmol/L (ref 22–32)
Calcium: 8.9 mg/dL (ref 8.9–10.3)
Chloride: 104 mmol/L (ref 98–111)
Creatinine, Ser: 0.5 mg/dL (ref 0.44–1.00)
GFR, Estimated: >60 mL/min (ref >60)
Glucose, Bld: 95 mg/dL (ref 70–99)
Potassium: 3.5 mmol/L (ref 3.5–5.1)
Sodium: 136 mmol/L (ref 135–145)
Total Bilirubin: 0.4 mg/dL (ref 0.3–1.2)
Total Protein: 6.4 g/dL — ABNORMAL LOW (ref 6.5–8.1)

## 2021-02-20 LAB — WET PREP, GENITAL
Clue Cells Wet Prep HPF POC: NONE SEEN
Sperm: NONE SEEN
Trich, Wet Prep: NONE SEEN
Yeast Wet Prep HPF POC: NONE SEEN

## 2021-02-20 LAB — RESP PANEL BY RT-PCR (FLU A&B, COVID) ARPGX2
Influenza A by PCR: NEGATIVE
Influenza B by PCR: NEGATIVE
SARS Coronavirus 2 by RT PCR: NEGATIVE

## 2021-02-20 LAB — PROTEIN / CREATININE RATIO, URINE
Creatinine, Urine: 97.14 mg/dL
Protein Creatinine Ratio: 0.27 mg/mg{Cre} — ABNORMAL HIGH (ref 0.00–0.15)
Total Protein, Urine: 26 mg/dL

## 2021-02-20 LAB — CBC
HCT: 32.2 % — ABNORMAL LOW (ref 36.0–46.0)
Hemoglobin: 10 g/dL — ABNORMAL LOW (ref 12.0–15.0)
MCH: 25.7 pg — ABNORMAL LOW (ref 26.0–34.0)
MCHC: 31.1 g/dL (ref 30.0–36.0)
MCV: 82.8 fL (ref 80.0–100.0)
Platelets: 287 10*3/uL (ref 150–400)
RBC: 3.89 MIL/uL (ref 3.87–5.11)
RDW: 16.5 % — ABNORMAL HIGH (ref 11.5–15.5)
WBC: 7.4 10*3/uL (ref 4.0–10.5)
nRBC: 0 % (ref 0.0–0.2)

## 2021-02-20 LAB — POCT FERN TEST: POCT Fern Test: NEGATIVE

## 2021-02-20 NOTE — Procedures (Signed)
Nicole Landry 1986-04-21 [redacted]w[redacted]d  Fetus A Non-Stress Test Interpretation for 02/20/21  Indication: Unsatisfactory BPP  Fetal Heart Rate A Mode: External Baseline Rate (A): 140 bpm Variability: Moderate Accelerations: 15 x 15 Decelerations: None Multiple birth?: No  Uterine Activity Mode: Palpation, Toco Contraction Frequency (min): none Resting Tone Palpated: Relaxed  Interpretation (Fetal Testing) Nonstress Test Interpretation: Reactive Overall Impression: Reassuring for gestational age Comments: Dr.Fang reviewed tracing

## 2021-02-20 NOTE — MAU Note (Signed)
Pt started leaking fluid at 5pm. Closed in office last week. BPP in office was 6/8 for breathing today. Pt has had fast delivery with her girl but c/s for others. This feels same as the fast delivery. No pain, feels pressure.

## 2021-02-20 NOTE — MAU Provider Note (Addendum)
None     Chief Complaint:  Rupture of Membranes   Nicole Landry is  35 y.o. [id number] at [redacted]w[redacted]d presents complaining of Rupture of Membranes .   Pt stated that she has had 3 "gushes" (not running down legs, but underware became wet) since 1700.  Rare ctx, c/o pain and pressure for several weeks.   Obstetrical/Gynecological History: OB History     Gravida  7   Para  4   Term  4   Preterm  0   AB  2   Living  4      SAB  1   IAB  0   Ectopic  1   Multiple  0   Live Births  4        Obstetric Comments  Pt states does not want a C/S for this Pregnancy.        Past Medical History: Past Medical History:  Diagnosis Date   Blood transfusion without reported diagnosis    after ectopic   Ectopic pregnancy, tubal    Gestational diabetes mellitus 2011   denies gestational DM with current pregnancy   Ovarian cyst    Status post vacuum-assisted vaginal delivery (5/17) 11/20/2015    Past Surgical History: Past Surgical History:  Procedure Laterality Date   BILATERAL SALPINGECTOMY Right 02/08/2013   Procedure:  SALPINGECTOMY;  Surgeon: Antionette Char, MD;  Location: [facility];  Service: Gynecology;  Laterality: Right;   CESAREAN SECTION     CESAREAN SECTION N/A 08/18/2017   Procedure: Repeat CESAREAN SECTION;  Surgeon: Shea Evans, MD;  Location: [facility] BIRTHING SUITES;  Service: Obstetrics;  Laterality: N/A;  EDD: 08/24/17 Allergy: Vicodin   CESAREAN SECTION N/A 04/12/2019   Procedure: Repeat CESAREAN SECTION;  Surgeon: Shea Evans, MD;  Location: [facility] LD [facility]S;  Service: Obstetrics;  Laterality: N/A;  EDD: 04/17/19 Allergy: Vicodin   DILATION AND CURETTAGE OF UTERUS     LAPAROSCOPY N/A 02/08/2013   Procedure: LAPAROSCOPY OPERATIVE;  Surgeon: Antionette Char, MD;  Location: [facility];  Service: Gynecology;  Laterality: N/A;   SALPINGECTOMY Right 02/2013   op note in epic    Family History: Family History  Problem Relation Age of Onset   Asthma Neg Hx     Depression Neg Hx    Diabetes Neg Hx    Heart disease Neg Hx    Hypertension Neg Hx    Obesity Neg Hx     Social History: Social History   Tobacco Use   Smoking status: Never   Smokeless tobacco: Never  Vaping Use   Vaping Use: Never used  Substance Use Topics   Alcohol use: No   Drug use: No    Allergies:  Allergies  Allergen Reactions   Food Anaphylaxis and Other (See Comments)    Pt is allergic to Cantalope   Vicodin [Hydrocodone-Acetaminophen] Other (See Comments)    Reaction:  Seizures; has tolerated acetaminophen and other narcotics in the past    Meds:  Medications Prior to Admission  Medication Sig Dispense Refill Last Dose   aspirin EC 81 MG tablet Take 1 tablet (81 mg total) by mouth daily. Swallow whole. 30 tablet 11 Past Week   ferrous sulfate 325 (65 FE) MG tablet Take 1 tablet (325 mg total) by mouth every other day. 60 tablet 2 Past Week   Prenatal Vit-Fe Fumarate-FA (PRENATAL MULTIVITAMIN) TABS tablet Take 1 tablet by mouth daily at 12 noon.   02/20/2021   Accu-Chek Softclix Lancets lancets Use as  instructed 100 each 12    glucose blood test strip Use as instructed 100 each 12     Review of Systems   Constitutional: Negative for fever and chills Eyes: Negative for visual disturbances Respiratory: Negative for shortness of breath, dyspnea Cardiovascular: Negative for chest pain or palpitations  Gastrointestinal: Negative for vomiting, diarrhea and constipation Genitourinary: Negative for dysuria and urgency Musculoskeletal: Negative for back pain, joint pain, myalgias.  Normal ROM  Neurological: Negative for dizziness and headaches    Physical Exam  Blood pressure 134/79, pulse (!) 112, temperature 98.4 F (36.9 C), temperature source Oral, resp. rate 20, height 5\' 4"  (1.626 m), weight 110.7 kg, last menstrual period 06/07/2020, SpO2 98 %, unknown if currently breastfeeding.  Patient Vitals for the past 24 hrs:  BP Temp Temp src Pulse Resp SpO2  Height Weight  02/20/21 2145 127/60 -- -- (!) 102 -- 99 % -- --  02/20/21 2130 126/69 -- -- 99 -- 98 % -- --  02/20/21 2115 123/68 -- -- (!) 101 -- 98 % -- --  02/20/21 2100 116/71 -- -- (!) 104 -- -- -- --  02/20/21 2045 119/74 -- -- (!) 110 -- 98 % -- --  02/20/21 2015 134/79 -- -- (!) 112 -- 98 % -- --  02/20/21 2006 140/83 98.4 F (36.9 C) Oral (!) 106 20 98 % -- --  02/20/21 2000 -- -- -- (!) 114 -- 98 % -- --  02/20/21 1937 (!) 153/84 98.6 F (37 C) Oral (!) 116 20 99 % 5\' 4"  (1.626 m) 243 lb 15.8 oz (110.7 kg)   GENERAL: Well-developed, well-nourished female in no acute distress.  LUNGS: Normal respiratory effort HEART: Regular rate and rhythm. ABDOMEN: Soft, nontender, nondistended, gravid.  EXTREMITIES: Nontender, no edema, 2+ distal pulses. DTR's 2+ PELVIC:  SSE:  scant amount of normal appearing discharge w/o odor.  No pooling, negative fern and valsalva.   CERVICAL EXAM: Dilatation 0cm   Effacement thick%   Station =3   Presentation: cephalic FHT:  Baseline rate 150 bpm   Variability moderate  Accelerations present   Decelerations none Contractions:rare   At this time, care turned over to 02/22/21, NP.  Care turned over to [facility]. Donia Ast, CNM  Labs: Results for orders placed or performed during the hospital encounter of 02/20/21 (from the past 24 hour(s))  Wet prep, genital   Collection Time: 02/20/21  8:25 PM  Result Value Ref Range   Yeast Wet Prep HPF POC NONE SEEN NONE SEEN   Trich, Wet Prep NONE SEEN NONE SEEN   Clue Cells Wet Prep HPF POC NONE SEEN NONE SEEN   WBC, Wet Prep HPF POC MANY (A) NONE SEEN   Sperm NONE SEEN   Fern Test   Collection Time: 02/20/21  8:40 PM  Result Value Ref Range   POCT Fern Test Negative = intact amniotic membranes    Imaging Studies:    Assessment: Nicole Landry is  35 y.o. Kerby Less at [redacted]w[redacted]d presents with .  Plan:   [id number] 8/18/20228:57 PM  Assumed care: Results for orders placed or  performed during the hospital encounter of 02/20/21 (from the past 24 hour(s))  Protein / creatinine ratio, urine     Status: Abnormal   Collection Time: 02/20/21  7:39 PM  Result Value Ref Range   Creatinine, Urine 97.14 mg/dL   Total Protein, Urine 26 mg/dL   Protein Creatinine Ratio 0.27 (H) 0.00 - 0.15 mg/mg[Cre]  Wet prep, genital  Status: Abnormal   Collection Time: 02/20/21  8:25 PM  Result Value Ref Range   Yeast Wet Prep HPF POC NONE SEEN NONE SEEN   Trich, Wet Prep NONE SEEN NONE SEEN   Clue Cells Wet Prep HPF POC NONE SEEN NONE SEEN   WBC, Wet Prep HPF POC MANY (A) NONE SEEN   Sperm NONE SEEN   Fern Test     Status: Normal   Collection Time: 02/20/21  8:40 PM  Result Value Ref Range   POCT Fern Test Negative = intact amniotic membranes   CBC     Status: Abnormal   Collection Time: 02/20/21  8:47 PM  Result Value Ref Range   WBC 7.4 4.0 - 10.5 K/uL   RBC 3.89 3.87 - 5.11 MIL/uL   Hemoglobin 10.0 (L) 12.0 - 15.0 g/dL   HCT 17.9 (L) 15.0 - 56.9 %   MCV 82.8 80.0 - 100.0 fL   MCH 25.7 (L) 26.0 - 34.0 pg   MCHC 31.1 30.0 - 36.0 g/dL   RDW 79.4 (H) 80.1 - 65.5 %   Platelets 287 150 - 400 K/uL   nRBC 0.0 0.0 - 0.2 %  Comprehensive metabolic panel     Status: Abnormal   Collection Time: 02/20/21  8:47 PM  Result Value Ref Range   Sodium 136 135 - 145 mmol/L   Potassium 3.5 3.5 - 5.1 mmol/L   Chloride 104 98 - 111 mmol/L   CO2 22 22 - 32 mmol/L   Glucose, Bld 95 70 - 99 mg/dL   BUN <5 (L) 6 - 20 mg/dL   Creatinine, Ser 3.74 0.44 - 1.00 mg/dL   Calcium 8.9 8.9 - 82.7 mg/dL   Total Protein 6.4 (L) 6.5 - 8.1 g/dL   Albumin 2.9 (L) 3.5 - 5.0 g/dL   AST 19 15 - 41 U/L   ALT 10 0 - 44 U/L   Alkaline Phosphatase 85 38 - 126 U/L   Total Bilirubin 0.4 0.3 - 1.2 mg/dL   GFR, Estimated >07 >86 mL/min   Anion gap 10 5 - 15  Resp Panel by RT-PCR (Flu A&B, Covid) Nasopharyngeal Swab     Status: None   Collection Time: 02/20/21 10:16 PM   Specimen: Nasopharyngeal Swab;  Nasopharyngeal(NP) swabs in vial transport medium  Result Value Ref Range   SARS Coronavirus 2 by RT PCR NEGATIVE NEGATIVE   Influenza A by PCR NEGATIVE NEGATIVE   Influenza B by PCR NEGATIVE NEGATIVE   Reviewed BPs with Dr Shawnie Pons Had only one elevated BP with normal labs, though Pr/Cr Ratio is high normal and may reflect a trend toward Gestational Hypertension.   Discussed with patient that if she has more elevated BPs we would recommend IOL between 37-38 weeks Will message office to get her in Friday  for BP check Messaged Dr Debroah Loop (has seen her few times) re: [facility], possible earlier C/S Chart review revealed mildly elevated BPs on two visits in office (7/8 and 8/17)   Encouraged to return if she develops worsening of symptoms, increase in pain, fever, or other concerning symptoms.   Aviva Signs, CNM

## 2021-02-21 ENCOUNTER — Inpatient Hospital Stay (HOSPITAL_COMMUNITY): Payer: Medicaid Other | Admitting: Anesthesiology

## 2021-02-21 ENCOUNTER — Other Ambulatory Visit: Payer: Self-pay | Admitting: *Deleted

## 2021-02-21 ENCOUNTER — Encounter (HOSPITAL_COMMUNITY)
Admission: AD | Disposition: A | Discharge status: Home / Self Care | Payer: Self-pay | Source: Home / Self Care | Attending: Obstetrics and Gynecology

## 2021-02-21 ENCOUNTER — Encounter (HOSPITAL_COMMUNITY): Payer: Self-pay | Admitting: Obstetrics and Gynecology

## 2021-02-21 ENCOUNTER — Inpatient Hospital Stay (HOSPITAL_COMMUNITY)
Admission: AD | Admit: 2021-02-21 | Discharge: 2021-02-24 | DRG: 787 | Disposition: A | Discharge status: Home / Self Care | Payer: Medicaid Other | Attending: Obstetrics and Gynecology | Admitting: Obstetrics and Gynecology

## 2021-02-21 DIAGNOSIS — D62 Acute posthemorrhagic anemia: Secondary | ICD-10-CM | POA: Diagnosis not present

## 2021-02-21 DIAGNOSIS — Z3A36 36 weeks gestation of pregnancy: Secondary | ICD-10-CM | POA: Diagnosis not present

## 2021-02-21 DIAGNOSIS — Z98891 History of uterine scar from previous surgery: Secondary | ICD-10-CM

## 2021-02-21 DIAGNOSIS — R638 Other symptoms and signs concerning food and fluid intake: Secondary | ICD-10-CM

## 2021-02-21 DIAGNOSIS — O99214 Obesity complicating childbirth: Secondary | ICD-10-CM | POA: Diagnosis present

## 2021-02-21 DIAGNOSIS — O09529 Supervision of elderly multigravida, unspecified trimester: Secondary | ICD-10-CM

## 2021-02-21 DIAGNOSIS — O134 Gestational [pregnancy-induced] hypertension without significant proteinuria, complicating childbirth: Secondary | ICD-10-CM | POA: Diagnosis present

## 2021-02-21 DIAGNOSIS — R030 Elevated blood-pressure reading, without diagnosis of hypertension: Secondary | ICD-10-CM | POA: Diagnosis not present

## 2021-02-21 DIAGNOSIS — Z3A37 37 weeks gestation of pregnancy: Secondary | ICD-10-CM

## 2021-02-21 DIAGNOSIS — O24419 Gestational diabetes mellitus in pregnancy, unspecified control: Secondary | ICD-10-CM | POA: Diagnosis present

## 2021-02-21 DIAGNOSIS — O34211 Maternal care for low transverse scar from previous cesarean delivery: Principal | ICD-10-CM | POA: Diagnosis present

## 2021-02-21 DIAGNOSIS — O24420 Gestational diabetes mellitus in childbirth, diet controlled: Secondary | ICD-10-CM | POA: Diagnosis present

## 2021-02-21 DIAGNOSIS — Z20822 Contact with and (suspected) exposure to covid-19: Secondary | ICD-10-CM | POA: Diagnosis present

## 2021-02-21 DIAGNOSIS — O3663X0 Maternal care for excessive fetal growth, third trimester, not applicable or unspecified: Secondary | ICD-10-CM | POA: Diagnosis present

## 2021-02-21 DIAGNOSIS — O99824 Streptococcus B carrier state complicating childbirth: Secondary | ICD-10-CM | POA: Diagnosis present

## 2021-02-21 DIAGNOSIS — O99019 Anemia complicating pregnancy, unspecified trimester: Secondary | ICD-10-CM | POA: Diagnosis present

## 2021-02-21 DIAGNOSIS — O9081 Anemia of the puerperium: Secondary | ICD-10-CM | POA: Diagnosis not present

## 2021-02-21 DIAGNOSIS — O24410 Gestational diabetes mellitus in pregnancy, diet controlled: Secondary | ICD-10-CM

## 2021-02-21 DIAGNOSIS — O09523 Supervision of elderly multigravida, third trimester: Secondary | ICD-10-CM

## 2021-02-21 DIAGNOSIS — O26893 Other specified pregnancy related conditions, third trimester: Secondary | ICD-10-CM | POA: Diagnosis not present

## 2021-02-21 DIAGNOSIS — O139 Gestational [pregnancy-induced] hypertension without significant proteinuria, unspecified trimester: Secondary | ICD-10-CM | POA: Diagnosis present

## 2021-02-21 DIAGNOSIS — Z30430 Encounter for insertion of intrauterine contraceptive device: Secondary | ICD-10-CM

## 2021-02-21 DIAGNOSIS — O99210 Obesity complicating pregnancy, unspecified trimester: Secondary | ICD-10-CM | POA: Diagnosis present

## 2021-02-21 LAB — TYPE AND SCREEN
ABO/RH(D): O POS
Antibody Screen: NEGATIVE

## 2021-02-21 LAB — GLUCOSE, CAPILLARY: Glucose-Capillary: 94 mg/dL (ref 70–99)

## 2021-02-21 SURGERY — Surgical Case
Anesthesia: Spinal

## 2021-02-21 MED ORDER — CEFAZOLIN SODIUM-DEXTROSE 2-4 GM/100ML-% IV SOLN
INTRAVENOUS | Status: AC
  Filled 2021-02-21: qty 100

## 2021-02-21 MED ORDER — DEXAMETHASONE SODIUM PHOSPHATE 4 MG/ML IJ SOLN
INTRAMUSCULAR | Status: DC | PRN
  Administered 2021-02-21: 10 mg via INTRAVENOUS

## 2021-02-21 MED ORDER — NALBUPHINE HCL 10 MG/ML IJ SOLN: 5.0000 mg | INTRAMUSCULAR | Status: DC | PRN

## 2021-02-21 MED ORDER — KETOROLAC TROMETHAMINE 30 MG/ML IJ SOLN: 30.0000 mg | Freq: Four times a day (QID) | INTRAMUSCULAR | Status: DC | PRN

## 2021-02-21 MED ORDER — IBUPROFEN 600 MG PO TABS
600.0000 mg | ORAL_TABLET | Freq: Four times a day (QID) | ORAL | Status: DC
  Administered 2021-02-22 – 2021-02-24 (×7): 600 mg via ORAL
  Filled 2021-02-21 (×8): qty 1

## 2021-02-21 MED ORDER — PHENYLEPHRINE 40 MCG/ML (10ML) SYRINGE FOR IV PUSH (FOR BLOOD PRESSURE SUPPORT)
PREFILLED_SYRINGE | INTRAVENOUS | Status: AC
  Filled 2021-02-21: qty 10

## 2021-02-21 MED ORDER — NALOXONE HCL 0.4 MG/ML IJ SOLN: 0.4000 mg | INTRAMUSCULAR | Status: DC | PRN

## 2021-02-21 MED ORDER — HYDROMORPHONE HCL 1 MG/ML IJ SOLN: 0.2000 mg | INTRAMUSCULAR | Status: DC | PRN

## 2021-02-21 MED ORDER — DIPHENHYDRAMINE HCL 25 MG PO CAPS
25.0000 mg | ORAL_CAPSULE | Freq: Four times a day (QID) | ORAL | Status: DC | PRN
  Administered 2021-02-22: 25 mg via ORAL
  Filled 2021-02-21: qty 1

## 2021-02-21 MED ORDER — ONDANSETRON HCL 4 MG/2ML IJ SOLN: 4.0000 mg | Freq: Three times a day (TID) | INTRAMUSCULAR | Status: DC | PRN

## 2021-02-21 MED ORDER — FENTANYL CITRATE (PF) 100 MCG/2ML IJ SOLN
INTRAMUSCULAR | Status: DC | PRN
  Administered 2021-02-21: 15 ug via INTRATHECAL

## 2021-02-21 MED ORDER — OXYTOCIN-SODIUM CHLORIDE 30-0.9 UT/500ML-% IV SOLN: INTRAVENOUS | Status: DC | PRN

## 2021-02-21 MED ORDER — ONDANSETRON HCL 4 MG/2ML IJ SOLN
INTRAMUSCULAR | Status: AC
  Filled 2021-02-21: qty 2

## 2021-02-21 MED ORDER — SCOPOLAMINE 1 MG/3DAYS TD PT72: 1.0000 | MEDICATED_PATCH | Freq: Once | TRANSDERMAL | Status: DC

## 2021-02-21 MED ORDER — BUPIVACAINE IN DEXTROSE 0.75-8.25 % IT SOLN
INTRATHECAL | Status: AC
  Filled 2021-02-21: qty 2

## 2021-02-21 MED ORDER — PHENYLEPHRINE HCL-NACL 20-0.9 MG/250ML-% IV SOLN
INTRAVENOUS | Status: AC
  Filled 2021-02-21: qty 250

## 2021-02-21 MED ORDER — KETOROLAC TROMETHAMINE 30 MG/ML IJ SOLN
30.0000 mg | Freq: Four times a day (QID) | INTRAMUSCULAR | Status: AC
  Administered 2021-02-21 – 2021-02-22 (×4): 30 mg via INTRAVENOUS
  Filled 2021-02-21 (×4): qty 1

## 2021-02-21 MED ORDER — PHENYLEPHRINE HCL-NACL 20-0.9 MG/250ML-% IV SOLN
INTRAVENOUS | Status: DC | PRN
  Administered 2021-02-21: 60 ug/min via INTRAVENOUS

## 2021-02-21 MED ORDER — OXYTOCIN-SODIUM CHLORIDE 30-0.9 UT/500ML-% IV SOLN
INTRAVENOUS | Status: AC
  Filled 2021-02-21: qty 500

## 2021-02-21 MED ORDER — NALBUPHINE HCL 10 MG/ML IJ SOLN: 5.0000 mg | Freq: Once | INTRAMUSCULAR | Status: DC | PRN

## 2021-02-21 MED ORDER — PROPOFOL 10 MG/ML IV BOLUS
INTRAVENOUS | Status: DC | PRN
  Administered 2021-02-21 (×2): 10 mg via INTRAVENOUS

## 2021-02-21 MED ORDER — LEVONORGESTREL 20.1 MCG/DAY IU IUD
1.0000 | INTRAUTERINE_SYSTEM | Freq: Once | INTRAUTERINE | Status: AC
  Administered 2021-02-21: 1 via INTRAUTERINE

## 2021-02-21 MED ORDER — DEXTROSE 5 % IV SOLN
1.0000 ug/kg/h | INTRAVENOUS | Status: DC | PRN
  Filled 2021-02-21: qty 5

## 2021-02-21 MED ORDER — DIPHENHYDRAMINE HCL 50 MG/ML IJ SOLN: 12.5000 mg | INTRAMUSCULAR | Status: DC | PRN

## 2021-02-21 MED ORDER — TETANUS-DIPHTH-ACELL PERTUSSIS 5-2.5-18.5 LF-MCG/0.5 IM SUSY: 0.5000 mL | PREFILLED_SYRINGE | Freq: Once | INTRAMUSCULAR | Status: DC

## 2021-02-21 MED ORDER — MENTHOL 3 MG MT LOZG: 1.0000 | LOZENGE | OROMUCOSAL | Status: DC | PRN

## 2021-02-21 MED ORDER — LACTATED RINGERS IV SOLN: INTRAVENOUS | Status: DC

## 2021-02-21 MED ORDER — SCOPOLAMINE 1 MG/3DAYS TD PT72
MEDICATED_PATCH | TRANSDERMAL | Status: DC | PRN
  Administered 2021-02-21: 1 via TRANSDERMAL

## 2021-02-21 MED ORDER — SIMETHICONE 80 MG PO CHEW: 80.0000 mg | CHEWABLE_TABLET | ORAL | Status: DC | PRN

## 2021-02-21 MED ORDER — ACETAMINOPHEN 500 MG PO TABS
1000.0000 mg | ORAL_TABLET | Freq: Four times a day (QID) | ORAL | Status: DC
  Administered 2021-02-21 – 2021-02-24 (×10): 1000 mg via ORAL
  Filled 2021-02-21 (×11): qty 2

## 2021-02-21 MED ORDER — BUPIVACAINE IN DEXTROSE 0.75-8.25 % IT SOLN
INTRATHECAL | Status: DC | PRN
  Administered 2021-02-21: 1.55 mL via INTRATHECAL

## 2021-02-21 MED ORDER — SIMETHICONE 80 MG PO CHEW
80.0000 mg | CHEWABLE_TABLET | Freq: Three times a day (TID) | ORAL | Status: DC
  Administered 2021-02-22 – 2021-02-24 (×6): 80 mg via ORAL
  Filled 2021-02-21 (×6): qty 1

## 2021-02-21 MED ORDER — WITCH HAZEL-GLYCERIN EX PADS: 1.0000 "application " | MEDICATED_PAD | CUTANEOUS | Status: DC | PRN

## 2021-02-21 MED ORDER — METOCLOPRAMIDE HCL 5 MG/ML IJ SOLN
INTRAMUSCULAR | Status: AC
  Filled 2021-02-21: qty 2

## 2021-02-21 MED ORDER — MORPHINE SULFATE (PF) 0.5 MG/ML IJ SOLN
INTRAMUSCULAR | Status: DC | PRN
  Administered 2021-02-21: 150 ug via INTRATHECAL

## 2021-02-21 MED ORDER — COCONUT OIL OIL: 1.0000 "application " | TOPICAL_OIL | Status: DC | PRN

## 2021-02-21 MED ORDER — SODIUM CHLORIDE 0.9% FLUSH: 3.0000 mL | INTRAVENOUS | Status: DC | PRN

## 2021-02-21 MED ORDER — OXYTOCIN-SODIUM CHLORIDE 30-0.9 UT/500ML-% IV SOLN
INTRAVENOUS | Status: DC | PRN
  Administered 2021-02-21: 300 mL via INTRAVENOUS

## 2021-02-21 MED ORDER — OXYCODONE HCL 5 MG PO TABS
5.0000 mg | ORAL_TABLET | ORAL | Status: DC | PRN
  Administered 2021-02-23 (×2): 5 mg via ORAL
  Administered 2021-02-23 – 2021-02-24 (×5): 10 mg via ORAL
  Filled 2021-02-21: qty 2
  Filled 2021-02-21: qty 1
  Filled 2021-02-21: qty 2
  Filled 2021-02-21: qty 1
  Filled 2021-02-21 (×4): qty 2

## 2021-02-21 MED ORDER — CEFAZOLIN SODIUM-DEXTROSE 2-4 GM/100ML-% IV SOLN
2.0000 g | INTRAVENOUS | Status: AC
  Administered 2021-02-21: 2 g via INTRAVENOUS

## 2021-02-21 MED ORDER — GABAPENTIN 100 MG PO CAPS
100.0000 mg | ORAL_CAPSULE | Freq: Two times a day (BID) | ORAL | Status: DC
  Administered 2021-02-21 – 2021-02-24 (×6): 100 mg via ORAL
  Filled 2021-02-21 (×6): qty 1

## 2021-02-21 MED ORDER — DIBUCAINE (PERIANAL) 1 % EX OINT: 1.0000 "application " | TOPICAL_OINTMENT | CUTANEOUS | Status: DC | PRN

## 2021-02-21 MED ORDER — ONDANSETRON HCL 4 MG/2ML IJ SOLN
INTRAMUSCULAR | Status: DC | PRN
  Administered 2021-02-21 (×2): 4 mg via INTRAVENOUS

## 2021-02-21 MED ORDER — PRENATAL MULTIVITAMIN CH
1.0000 | ORAL_TABLET | Freq: Every day | ORAL | Status: DC
  Administered 2021-02-22 – 2021-02-23 (×2): 1 via ORAL
  Filled 2021-02-21 (×2): qty 1

## 2021-02-21 MED ORDER — FENTANYL CITRATE (PF) 100 MCG/2ML IJ SOLN
INTRAMUSCULAR | Status: AC
  Filled 2021-02-21: qty 2

## 2021-02-21 MED ORDER — ENOXAPARIN SODIUM 60 MG/0.6ML IJ SOSY
60.0000 mg | PREFILLED_SYRINGE | INTRAMUSCULAR | Status: DC
  Administered 2021-02-22 – 2021-02-24 (×3): 60 mg via SUBCUTANEOUS
  Filled 2021-02-21 (×3): qty 0.6

## 2021-02-21 MED ORDER — MORPHINE SULFATE (PF) 0.5 MG/ML IJ SOLN
INTRAMUSCULAR | Status: AC
  Filled 2021-02-21: qty 10

## 2021-02-21 MED ORDER — ACETAMINOPHEN 500 MG PO TABS
1000.0000 mg | ORAL_TABLET | Freq: Once | ORAL | Status: AC
  Administered 2021-02-21: 1000 mg via ORAL
  Filled 2021-02-21: qty 2

## 2021-02-21 MED ORDER — LEVONORGESTREL 20.1 MCG/DAY IU IUD
INTRAUTERINE_SYSTEM | INTRAUTERINE | Status: AC
  Filled 2021-02-21: qty 1

## 2021-02-21 MED ORDER — LACTATED RINGERS IV SOLN: INTRAVENOUS | Status: DC | PRN

## 2021-02-21 MED ORDER — PHENYLEPHRINE HCL (PRESSORS) 10 MG/ML IV SOLN
INTRAVENOUS | Status: DC | PRN
  Administered 2021-02-21: 80 ug via INTRAVENOUS

## 2021-02-21 MED ORDER — DEXAMETHASONE SODIUM PHOSPHATE 4 MG/ML IJ SOLN
INTRAMUSCULAR | Status: AC
  Filled 2021-02-21: qty 2

## 2021-02-21 MED ORDER — METOCLOPRAMIDE HCL 5 MG/ML IJ SOLN
INTRAMUSCULAR | Status: DC | PRN
  Administered 2021-02-21: 10 mg via INTRAVENOUS

## 2021-02-21 MED ORDER — SENNOSIDES-DOCUSATE SODIUM 8.6-50 MG PO TABS
2.0000 | ORAL_TABLET | Freq: Every day | ORAL | Status: DC
  Administered 2021-02-22 – 2021-02-24 (×3): 2 via ORAL
  Filled 2021-02-21 (×3): qty 2

## 2021-02-21 MED ORDER — DIPHENHYDRAMINE HCL 25 MG PO CAPS: 25.0000 mg | ORAL_CAPSULE | ORAL | Status: DC | PRN

## 2021-02-21 MED ORDER — ZOLPIDEM TARTRATE 5 MG PO TABS: 5.0000 mg | ORAL_TABLET | Freq: Every evening | ORAL | Status: DC | PRN

## 2021-02-21 MED ORDER — SOD CITRATE-CITRIC ACID 500-334 MG/5ML PO SOLN
30.0000 mL | ORAL | Status: DC
  Filled 2021-02-21: qty 30

## 2021-02-21 MED ORDER — OXYTOCIN-SODIUM CHLORIDE 30-0.9 UT/500ML-% IV SOLN
2.5000 [IU]/h | INTRAVENOUS | Status: AC
  Administered 2021-02-21: 2.5 [IU]/h via INTRAVENOUS
  Filled 2021-02-21: qty 500

## 2021-02-21 SURGICAL SUPPLY — 41 items
ADH SKN CLS APL DERMABOND .7 (GAUZE/BANDAGES/DRESSINGS)
APL SKNCLS STERI-STRIP NONHPOA (GAUZE/BANDAGES/DRESSINGS) ×1
BENZOIN TINCTURE PRP APPL 2/3 (GAUZE/BANDAGES/DRESSINGS) ×2 IMPLANT
CHLORAPREP W/TINT 26ML (MISCELLANEOUS) ×2 IMPLANT
CLAMP CORD UMBIL (MISCELLANEOUS) IMPLANT
CLOTH BEACON ORANGE TIMEOUT ST (SAFETY) ×2 IMPLANT
DERMABOND ADVANCED (GAUZE/BANDAGES/DRESSINGS)
DERMABOND ADVANCED .7 DNX12 (GAUZE/BANDAGES/DRESSINGS) IMPLANT
DRESSING PREVENA PLUS CUSTOM (GAUZE/BANDAGES/DRESSINGS) ×1 IMPLANT
DRSG OPSITE POSTOP 4X10 (GAUZE/BANDAGES/DRESSINGS) ×2 IMPLANT
DRSG PREVENA PLUS CUSTOM (GAUZE/BANDAGES/DRESSINGS) ×2
ELECT REM PT RETURN 9FT ADLT (ELECTROSURGICAL) ×2
ELECTRODE REM PT RTRN 9FT ADLT (ELECTROSURGICAL) ×1 IMPLANT
EXTRACTOR VACUUM KIWI (MISCELLANEOUS) IMPLANT
GLOVE BIOGEL PI IND STRL 7.0 (GLOVE) ×3 IMPLANT
GLOVE BIOGEL PI INDICATOR 7.0 (GLOVE) ×3
GLOVE ECLIPSE 6.5 STRL STRAW (GLOVE) ×2 IMPLANT
GOWN STRL REUS W/TWL LRG LVL3 (GOWN DISPOSABLE) ×6 IMPLANT
KIT ABG SYR 3ML LUER SLIP (SYRINGE) IMPLANT
NEEDLE HYPO 25X5/8 SAFETYGLIDE (NEEDLE) IMPLANT
NS IRRIG 1000ML POUR BTL (IV SOLUTION) ×2 IMPLANT
PACK C SECTION WH (CUSTOM PROCEDURE TRAY) ×2 IMPLANT
PAD ABD 7.5X8 STRL (GAUZE/BANDAGES/DRESSINGS) ×2 IMPLANT
PAD OB MATERNITY 4.3X12.25 (PERSONAL CARE ITEMS) ×2 IMPLANT
PENCIL SMOKE EVAC W/HOLSTER (ELECTROSURGICAL) ×2 IMPLANT
RETRACTOR TRAXI PANNICULUS (MISCELLANEOUS) ×1 IMPLANT
RTRCTR C-SECT PINK 25CM LRG (MISCELLANEOUS) ×2 IMPLANT
STRIP CLOSURE SKIN 1/2X4 (GAUZE/BANDAGES/DRESSINGS) ×2 IMPLANT
SUT PLAIN 0 NONE (SUTURE) IMPLANT
SUT PLAIN 2 0 XLH (SUTURE) IMPLANT
SUT VIC AB 0 CT1 27 (SUTURE) ×4
SUT VIC AB 0 CT1 27XBRD ANBCTR (SUTURE) ×2 IMPLANT
SUT VIC AB 0 CTX 36 (SUTURE) ×6
SUT VIC AB 0 CTX36XBRD ANBCTRL (SUTURE) ×3 IMPLANT
SUT VIC AB 2-0 CT1 27 (SUTURE) ×2
SUT VIC AB 2-0 CT1 TAPERPNT 27 (SUTURE) ×1 IMPLANT
SUT VIC AB 4-0 KS 27 (SUTURE) ×2 IMPLANT
TOWEL OR 17X24 6PK STRL BLUE (TOWEL DISPOSABLE) ×2 IMPLANT
TRAXI PANNICULUS RETRACTOR (MISCELLANEOUS) ×1
TRAY FOLEY W/BAG SLVR 14FR LF (SET/KITS/TRAYS/PACK) IMPLANT
WATER STERILE IRR 1000ML POUR (IV SOLUTION) ×2 IMPLANT

## 2021-02-21 NOTE — Transfer of Care (Signed)
Immediate Anesthesia Transfer of Care Note  Patient: Nicole Landry  Procedure(s) Performed: CESAREAN SECTION  Patient Location: PACU  Anesthesia Type:Spinal  Level of Consciousness: awake, alert  and oriented  Airway & Oxygen Therapy: Patient Spontanous Breathing  Post-op Assessment: Report given to RN and Post -op Vital signs reviewed and stable  Post vital signs: Reviewed and stable  Last Vitals:  Vitals Value Taken Time  BP 143/79 02/21/21 1507  Temp    Pulse 72 02/21/21 1513  Resp 12 02/21/21 1513  SpO2 98 % 02/21/21 1513  Vitals shown include unvalidated device data.  Last Pain:  Vitals:   02/21/21 1310  TempSrc:   PainSc: 4          Complications: No notable events documented.

## 2021-02-21 NOTE — H&P (Signed)
OBSTETRIC ADMISSION HISTORY AND PHYSICAL  Nicole Landry is a 35 y.o. female [phone] with IUP at [redacted]w[redacted]d by L=5w [place] presenting for repeat c-section due to diagnosis of GHTN in s/o GDMA1. She reports +FMs, No LOF, no VB, no blurry vision, headaches or peripheral edema, and RUQ pain.  She plans on breast feeding. She request PP IUD for birth control. She received her prenatal care at  [facility]    Dating: By L=5wk [place] --->  Estimated Date of Delivery: 03/14/21  Sono:    @ [redacted]w[redacted]d CWD, normal anatomy, cephalic presentation, 3890g, >30% EFW   Prenatal History/Complications:  See below, but 3 prior c-sections  Past Medical History: Past Medical History:  Diagnosis Date   Blood transfusion without reported diagnosis    after ectopic   Ectopic pregnancy, tubal    Gestational diabetes mellitus 2011   denies gestational DM with current pregnancy   Ovarian cyst    Status post vacuum-assisted vaginal delivery (5/17) 11/20/2015    Past Surgical History: Past Surgical History:  Procedure Laterality Date   BILATERAL SALPINGECTOMY Right 02/08/2013   Procedure:  SALPINGECTOMY;  Surgeon: Antionette Char, MD;  Location: [facility] ORS;  Service: Gynecology;  Laterality: Right;   CESAREAN SECTION     CESAREAN SECTION N/A 08/18/2017   Procedure: Repeat CESAREAN SECTION;  Surgeon: Shea Evans, MD;  Location: [facility] BIRTHING SUITES;  Service: Obstetrics;  Laterality: N/A;  EDD: 08/24/17 Allergy: Vicodin   CESAREAN SECTION N/A 04/12/2019   Procedure: Repeat CESAREAN SECTION;  Surgeon: Shea Evans, MD;  Location: [facility] LD ORS;  Service: Obstetrics;  Laterality: N/A;  EDD: 04/17/19 Allergy: Vicodin   DILATION AND CURETTAGE OF UTERUS     LAPAROSCOPY N/A 02/08/2013   Procedure: LAPAROSCOPY OPERATIVE;  Surgeon: Antionette Char, MD;  Location: [facility] ORS;  Service: Gynecology;  Laterality: N/A;   SALPINGECTOMY Right 02/2013   op note in epic    Obstetrical History: OB History     Gravida  7   Para  4   Term  4    Preterm  0   AB  2   Living  4      SAB  1   IAB  0   Ectopic  1   Multiple  0   Live Births  4        Obstetric Comments  Pt states does not want a C/S for this Pregnancy.         Social History Social History   Socioeconomic History   Marital status: Married    Spouse name: Not on file   Number of children: Not on file   Years of education: Not on file   Highest education level: Not on file  Occupational History   Not on file  Tobacco Use   Smoking status: Never   Smokeless tobacco: Never  Vaping Use   Vaping Use: Never used  Substance and Sexual Activity   Alcohol use: No   Drug use: No   Sexual activity: Yes    Birth control/protection: None  Other Topics Concern   Not on file  Social History Narrative   ** Merged History Encounter **       Social Determinants of Health   Financial Resource Strain: Not on file  Food Insecurity: No Food Insecurity   Worried About Programme researcher, broadcasting/film/video in the Last Year: Never true   Ran Out of Food in the Last Year: Never true  Transportation Needs: No Transportation Needs  Lack of Transportation (Medical): No   Lack of Transportation (Non-Medical): No  Physical Activity: Not on file  Stress: Not on file  Social Connections: Not on file    Family History: Family History  Problem Relation Age of Onset   Asthma Neg Hx    Depression Neg Hx    Diabetes Neg Hx    Heart disease Neg Hx    Hypertension Neg Hx    Obesity Neg Hx     Allergies: Allergies  Allergen Reactions   Food Anaphylaxis and Other (See Comments)    Pt is allergic to Cantalope   Vicodin [Hydrocodone-Acetaminophen] Other (See Comments)    Reaction:  Seizures; has tolerated acetaminophen and other narcotics in the past    Medications Prior to Admission  Medication Sig Dispense Refill Last Dose   aspirin EC 81 MG tablet Take 1 tablet (81 mg total) by mouth daily. Swallow whole. 30 tablet 11 Past Month   ferrous sulfate 325 (65 FE) MG  tablet Take 1 tablet (325 mg total) by mouth every other day. 60 tablet 2 Past Month   Prenatal Vit-Fe Fumarate-FA (PRENATAL MULTIVITAMIN) TABS tablet Take 1 tablet by mouth daily at 12 noon.   02/20/2021   Accu-Chek Softclix Lancets lancets Use as instructed 100 each 12    glucose blood test strip Use as instructed 100 each 12      Review of Systems   All systems reviewed and negative except as stated in HPI  Blood pressure 133/81, pulse 91, temperature 98.2 F (36.8 C), temperature source Oral, resp. rate 20, height 5\' 4"  (1.626 m), weight 110.1 kg, last menstrual period 06/07/2020, SpO2 99 %, unknown if currently breastfeeding. General appearance: alert, cooperative, appears stated age, and no distress Lungs: clear to auscultation bilaterally Heart: regular rate and rhythm Abdomen: soft, non-tender; bowel sounds normal Pelvic: deferred Extremities: Homans sign is negative, no sign of DVT DTR's normal Presentation: cephalic Fetal monitoringBaseline: 150 bpm, reactive, no decels, + accels Uterine activity: irregular     Prenatal labs: ABO, Rh: --/--/O POS (08/19 1245) Antibody: NEG (08/19 1245) Rubella: 4.36 (03/15 1452) RPR: Non Reactive (06/17 0849)  HBsAg: Negative (03/15 1452)  HIV: Non Reactive (06/17 0849)  GBS: Positive/-- (08/11 0943)  1 hr Glucola Abnormal Genetic screening  NIPS Anatomy US completed and wnl  Prenatal Transfer Tool  Maternal Diabetes: Yes:  Diabetes Type:  Diet controlled Genetic Screening: Normal Maternal Ultrasounds/Referrals: Normal and Other: Macrosomia Fetal Ultrasounds or other Referrals:  None Maternal Substance Abuse:  No Significant Maternal Medications:  None Significant Maternal Lab Results: Group B Strep positive  Results for orders placed or performed during the hospital encounter of 02/21/21 (from the past 24 hour(s))  Type and screen [facility]   Collection Time: 02/21/21 12:45 PM  Result Value Ref Range    ABO/RH(D) O POS    Antibody Screen NEG    Sample Expiration      02/24/2021,2359 Performed at [facility], [address] [place], [place] [address]   Glucose, capillary   Collection Time: 02/21/21 12:56 PM  Result Value Ref Range   Glucose-Capillary 94 70 - 99 mg/dL  Results for orders placed or performed during the hospital encounter of 02/20/21 (from the past 24 hour(s))  Protein / creatinine ratio, urine   Collection Time: 02/20/21  7:39 PM  Result Value Ref Range   Creatinine, Urine 97.14 mg/dL   Total Protein, Urine 26 mg/dL   Protein Creatinine Ratio 0.27 (  H) 0.00 - 0.15 mg/mg[Cre]  Wet prep, genital   Collection Time: 02/20/21  8:25 PM  Result Value Ref Range   Yeast Wet Prep HPF POC NONE SEEN NONE SEEN   Trich, Wet Prep NONE SEEN NONE SEEN   Clue Cells Wet Prep HPF POC NONE SEEN NONE SEEN   WBC, Wet Prep HPF POC MANY (A) NONE SEEN   Sperm NONE SEEN   Fern Test   Collection Time: 02/20/21  8:40 PM  Result Value Ref Range   POCT Fern Test Negative = intact amniotic membranes   CBC   Collection Time: 02/20/21  8:47 PM  Result Value Ref Range   WBC 7.4 4.0 - 10.5 K/uL   RBC 3.89 3.87 - 5.11 MIL/uL   Hemoglobin 10.0 (L) 12.0 - 15.0 g/dL   HCT 54.6 (L) 27.0 - 35.0 %   MCV 82.8 80.0 - 100.0 fL   MCH 25.7 (L) 26.0 - 34.0 pg   MCHC 31.1 30.0 - 36.0 g/dL   RDW 09.3 (H) 81.8 - 29.9 %   Platelets 287 150 - 400 K/uL   nRBC 0.0 0.0 - 0.2 %  Comprehensive metabolic panel   Collection Time: 02/20/21  8:47 PM  Result Value Ref Range   Sodium 136 135 - 145 mmol/L   Potassium 3.5 3.5 - 5.1 mmol/L   Chloride 104 98 - 111 mmol/L   CO2 22 22 - 32 mmol/L   Glucose, Bld 95 70 - 99 mg/dL   BUN <5 (L) 6 - 20 mg/dL   Creatinine, Ser 3.71 0.44 - 1.00 mg/dL   Calcium 8.9 8.9 - 69.6 mg/dL   Total Protein 6.4 (L) 6.5 - 8.1 g/dL   Albumin 2.9 (L) 3.5 - 5.0 g/dL   AST 19 15 - 41 U/L   ALT 10 0 - 44 U/L   Alkaline Phosphatase 85 38 - 126 U/L   Total Bilirubin 0.4 0.3 - 1.2  mg/dL   GFR, Estimated >78 >93 mL/min   Anion gap 10 5 - 15  Resp Panel by RT-PCR (Flu A&B, Covid) Nasopharyngeal Swab   Collection Time: 02/20/21 10:16 PM   Specimen: Nasopharyngeal Swab; Nasopharyngeal(NP) swabs in vial transport medium  Result Value Ref Range   SARS Coronavirus 2 by RT PCR NEGATIVE NEGATIVE   Influenza A by PCR NEGATIVE NEGATIVE   Influenza B by PCR NEGATIVE NEGATIVE    Patient Active Problem List   Diagnosis Date Noted   Gestational hypertension 02/21/2021   GBS (group B streptococcus) infection 02/19/2021   Gestational diabetes mellitus (GDM) in third trimester 12/23/2020   Obesity in pregnancy, antepartum 10/22/2020   Anemia of pregnancy 09/17/2020   Encounter for supervision of high-risk pregnancy with multigravida of advanced maternal age 18/02/2021   Status post repeat low transverse cesarean section 04/12/2019   Abnormal findings on diagnostic imaging of other specified body structures 08/13/2014    Assessment/Plan:  Nicole Landry is a 35 y.o. [id number] at [redacted]w[redacted]d here for [facility] for repeat c-section and PP IUD due to history of prior c-section.   #Labor: Not in labor, plan is for delivery by repeat c-section. Consent reviewed and signed. Discussed the risks of c-section which include but are not limited to bleeding, infection, injury to surrounding organs/tissues, need for catheter if bladder injury, risk of wound complications, need for reoperation, and if future pregnancy then risk of accreta risks associated.  #Pain: HA initial when she came in but much improved with tylenol. #FWB: NST reactive #ID:  Ancef preop #MOF: Breast #MOC: PP IUD Liletta #Circ:  N/A - baby girl  Milas Hock, MD  02/21/2021, 1:45 PM

## 2021-02-21 NOTE — Anesthesia Procedure Notes (Addendum)
Spinal  Patient location during procedure: OR Start time: 02/21/2021 1:44 PM End time: 02/21/2021 1:50 PM Reason for block: surgical anesthesia Staffing Anesthesiologist: Bethena Midget, MD Preanesthetic Checklist Completed: patient identified, IV checked, site marked, risks and benefits discussed, surgical consent, monitors and equipment checked, pre-op evaluation and timeout performed Spinal Block Patient position: sitting Prep: DuraPrep Patient monitoring: heart rate, cardiac monitor, continuous pulse ox and blood pressure Approach: midline Location: L3-4 Injection technique: single-shot Needle Needle type: Sprotte  Needle gauge: 24 G Needle length: 9 cm Assessment Sensory level: T4 Events: CSF return

## 2021-02-21 NOTE — Discharge Summary (Addendum)
Postpartum Discharge Summary    Patient Name: Nicole Landry DOB: 07-15-1985 MRN: [id number]  Date of admission: 02/21/2021 Delivery date:02/21/2021  Delivering provider: Radene Gunning  Date of discharge: 02/24/2021  Admitting diagnosis: Gestational hypertension [O13.9] Intrauterine pregnancy: [redacted]w[redacted]d    Secondary diagnosis:  Principal Problem:   Status post repeat low transverse cesarean section Active Problems:   Anemia of pregnancy   Obesity in pregnancy, antepartum   Gestational diabetes mellitus (GDM) in third trimester   Gestational hypertension  Additional problems: None    Discharge diagnosis: Term Pregnancy Delivered, Gestational Hypertension, and GDM A1                                              Post partum procedures: ppIUD , IV Venofer  Complications: None  Hospital course: Sceduled C/S. 35y.o. yo [id number] admitted to the hospital 02/21/2021 for scheduled cesarean section with the following indication: Prior Uterine Surgery and GDMA1, gHTN .Delivery details are as follows:  Membrane Rupture Time/Date:  ,02/21/2021   Delivery Method:C-Section, Low Transverse  Details of operation can be found in separate operative note.  Patient had an uncomplicated postpartum course.  She is ambulating, tolerating a regular diet, passing flatus, and urinating well. Patient is discharged home in stable condition on  02/24/21        Newborn Data: Birth date:02/21/2021  Birth time:2:18 PM  Gender:Female  Living status:Living  Apgars:8 ,10  [id number]     Magnesium Sulfate received: No BMZ received: No Rhophylac: N/A MMR: N/A T-DaP: Given prenatally Flu: No Transfusion: No  Physical exam  Vitals:   02/23/21 0549 02/23/21 1325 02/23/21 2055 02/24/21 0517  BP: (!) 136/91 140/86 133/90 135/77  Pulse: 96 93 (!) 114 97  Resp: 20 19 18 20   Temp: 98.2 F (36.8 C) 98.9 F (37.2 C) 98.5 F (36.9 C) 98.4 F (36.9 C)  TempSrc: Oral Oral Oral Oral  SpO2: 100%   98% 98%  Weight:      Height:       General: alert, cooperative, and no distress Lochia: appropriate Uterine Fundus: firm Incision: healing well with no significant drainage, wound vac in place DVT Evaluation: no LE edema or calf tenderness to palpation   Labs: Lab Results  Component Value Date   WBC 14.8 (H) 02/22/2021   HGB 8.4 (L) 02/22/2021   HCT 27.0 (L) 02/22/2021   MCV 82.8 02/22/2021   PLT 211 02/22/2021   CMP Latest Ref Rng & Units 02/20/2021  Glucose 70 - 99 mg/dL 95  BUN 6 - 20 mg/dL <5(L)  Creatinine 0.44 - 1.00 mg/dL 0.50  Sodium 135 - 145 mmol/L 136  Potassium 3.5 - 5.1 mmol/L 3.5  Chloride 98 - 111 mmol/L 104  CO2 22 - 32 mmol/L 22  Calcium 8.9 - 10.3 mg/dL 8.9  Total Protein 6.5 - 8.1 g/dL 6.4(L)  Total Bilirubin 0.3 - 1.2 mg/dL 0.4  Alkaline Phos 38 - 126 U/L 85  AST 15 - 41 U/L 19  ALT 0 - 44 U/L 10   Edinburgh Score: Edinburgh Postnatal Depression Scale Screening Tool 02/24/2021  I have been able to laugh and see the funny side of things. 0  I have looked forward with enjoyment to things. 0  I have blamed myself unnecessarily when things went wrong. 0  I have been  anxious or worried for no good reason. 0  I have felt scared or panicky for no good reason. 0  Things have been getting on top of me. 0  I have been so unhappy that I have had difficulty sleeping. 0  I have felt sad or miserable. 0  I have been so unhappy that I have been crying. 0  The thought of harming myself has occurred to me. 0  Edinburgh Postnatal Depression Scale Total 0     After visit meds:  Allergies as of 02/24/2021       Reactions   Food Anaphylaxis, Other (See Comments)   Pt is allergic to Cantalope   Vicodin [hydrocodone-acetaminophen] Other (See Comments)   Reaction:  Seizures; has tolerated acetaminophen and other narcotics in the past        Medication List     STOP taking these medications    Accu-Chek Softclix Lancets lancets   aspirin EC 81 MG  tablet   glucose blood test strip       TAKE these medications    acetaminophen 500 MG tablet Commonly known as: TYLENOL Take 2 tablets (1,000 mg total) by mouth every 8 (eight) hours as needed (pain).   ferrous sulfate 325 (65 FE) MG tablet Take 1 tablet (325 mg total) by mouth every other day.   ibuprofen 800 MG tablet Commonly known as: ADVIL Take 1 tablet (800 mg total) by mouth every 8 (eight) hours as needed (pain).   NIFEdipine 60 MG 24 hr tablet Commonly known as: ADALAT CC Take 1 tablet (60 mg total) by mouth daily. Start taking on: February 25, 2021   oxyCODONE 5 MG immediate release tablet Commonly known as: Oxy IR/ROXICODONE Take 1-2 tablets (5-10 mg total) by mouth every 4 (four) hours as needed for moderate pain or severe pain.   prenatal multivitamin Tabs tablet Take 1 tablet by mouth daily at 12 noon.               Discharge Care Instructions  (From admission, onward)           Start     Ordered   02/24/21 0000  Discharge wound care:       Comments: Maintain dressing until incision check in office   02/24/21 1045            Discharge home in stable condition Infant Feeding: Breast Infant Disposition: home with mother Discharge instruction: per After Visit Summary and Postpartum booklet. Activity: Advance as tolerated. Pelvic rest for 6 weeks.  Diet: routine diet Future Appointments:  Follow up Visit: Message sent by Dr. Gwenlyn Perking on 8/22. BP check and incision check to be scheduled for 8/26.  Please schedule this patient for a In person postpartum visit in 4 weeks with the following provider: MD. Additional Postpartum F/U: Incision check 1 week and BP check 1 week (8/26); 2 hour GTT High risk pregnancy complicated by: HTN, GDMA1, Hx of CS Delivery mode: C-Section, Low Transverse  Anticipated Birth Control:  PP IUD placed   GME ATTESTATION:  I saw and evaluated the patient. I agree with the findings and the plan of care as  documented in the resident's note. I have made changes to documentation as necessary.  Stable for discharge home. Wound care measures reviewed. Fasting CGM normal. BP remains elevated on Procardia 30 mg. Increased to Procardia 60 mg daily. Incision check and BP check planned for 8/26 - message sent to schedulers. Meds to bed and baby scripts ordered.  Vilma Meckel, MD OB Fellow, [place] for [place] 02/24/2021 2:45 PM

## 2021-02-21 NOTE — Anesthesia Preprocedure Evaluation (Signed)
Anesthesia Evaluation  Patient identified by MRN, date of birth, ID band Patient awake    Reviewed: Allergy & Precautions, H&P , NPO status , Patient's Chart, lab work & pertinent test results, reviewed documented beta blocker date and time   Airway Mallampati: III  TM Distance: >3 FB Neck ROM: full    Dental no notable dental hx. (+) Teeth Intact   Pulmonary neg pulmonary ROS,    Pulmonary exam normal breath sounds clear to auscultation       Cardiovascular Exercise Tolerance: Good hypertension, Pt. on medications Normal cardiovascular exam Rhythm:regular Rate:Normal     Neuro/Psych negative neurological ROS  negative psych ROS   GI/Hepatic negative GI ROS, Neg liver ROS,   Endo/Other  diabetes, GestationalMorbid obesity  Renal/GU negative Renal ROS  negative genitourinary   Musculoskeletal   Abdominal (+) + obese,   Peds  Hematology  (+) Blood dyscrasia, anemia ,   Anesthesia Other Findings   Reproductive/Obstetrics (+) Pregnancy                             Anesthesia Physical  Anesthesia Plan  ASA: 3 and emergent  Anesthesia Plan: Spinal   Post-op Pain Management:    Induction:   PONV Risk Score and Plan: 3 and Ondansetron, Dexamethasone, Scopolamine patch - Pre-op and Treatment may vary due to age or medical condition  Airway Management Planned: Nasal Cannula and Natural Airway  Additional Equipment: None  Intra-op Plan:   Post-operative Plan:   Informed Consent: I have reviewed the patients History and Physical, chart, labs and discussed the procedure including the risks, benefits and alternatives for the proposed anesthesia with the patient or authorized representative who has indicated his/her understanding and acceptance.     Dental Advisory Given  Plan Discussed with: CRNA and Anesthesiologist  Anesthesia Plan Comments: (  )        Anesthesia Quick  Evaluation

## 2021-02-21 NOTE — MAU Note (Signed)
Just wanted to come back and get BP checked.  Was told if her BP was elevated they may go ahead and do c/s today.  Has had some swelling off and on in feet and hands. +HA since last night ( did not take anything), denies epigastric pain, has some blurred vision.

## 2021-02-21 NOTE — MAU Provider Note (Signed)
Chief Complaint:  Headache, Hypertension, and Blurred Vision   Event Date/Time   First Provider Initiated Contact with Patient 02/21/21 1148     HPI: Nicole Landry is a 35 y.o. [id number] at [redacted]w[redacted]d who presents to maternity admissions reporting that she was told to come in for a BP check and possibly have her c-section if her BP is elevated. Patient reports she was seen last night for possible ROM and BP was elevated. BP has previously been elevated in the office as well. She reports a headache since last night, but has not taken anything as she reports she was told to not have anything to eat or drink after midnight. She denies vision changes, RUQ/chest pain, contractions, leaking fluid or bleeding. Endorses active fetal movement.   Pregnancy Course:   Past Medical History:  Diagnosis Date   Blood transfusion without reported diagnosis    after ectopic   Ectopic pregnancy, tubal    Gestational diabetes mellitus 2011   denies gestational DM with current pregnancy   Ovarian cyst    Status post vacuum-assisted vaginal delivery (5/17) 11/20/2015   OB History  Gravida Para Term Preterm AB Living  7 4 4  0 2 4  SAB IAB Ectopic Multiple Live Births  1 0 1 0 4    # Outcome Date GA Lbr Len/2nd Weight Sex Delivery Anes PTL Lv  7 Current           6 Term 04/12/19 [redacted]w[redacted]d  3950 g M CS-LTranv Spinal  LIV  5 Term 08/18/17 [redacted]w[redacted]d  4060 g M CS-LTranv Spinal  LIV  4 Term 11/20/15 [redacted]w[redacted]d 02:37 / 01:23 3690 g F VBAC, Vacuum Local, EPI  LIV  3 Ectopic 2014             Birth Comments: System Generated. Please review and update pregnancy details.  2 Term 05/26/10    M CS-LTranv   LIV  1 SAB             Obstetric Comments  Pt states does not want a C/S for this Pregnancy.   Past Surgical History:  Procedure Laterality Date   BILATERAL SALPINGECTOMY Right 02/08/2013   Procedure:  SALPINGECTOMY;  Surgeon: 04/10/2013, MD;  Location: [facility];  Service: Gynecology;  Laterality: Right;   CESAREAN  SECTION     CESAREAN SECTION N/A 08/18/2017   Procedure: Repeat CESAREAN SECTION;  Surgeon: 08/20/2017, MD;  Location: [facility] BIRTH[facility] SU[facility];  Service: Obstetrics;  Laterality: N/A;  EDD: 08/24/17 Allergy: Vicodin   CESAREAN SECTION N/A 04/12/2019   Procedure: Repeat CESAREAN SECTION;  Surgeon: 06/12/2019, MD;  Location: [facility] ORS;  Service: Obstetrics;  Laterality: N/A;  EDD: 04/17/19 Allergy: Vicodin   DILATION AND CURETTAGE OF UTERUS     LAPAROSCOPY N/A 02/08/2013   Procedure: LAPAROSCOPY OPERATIVE;  Surgeon: 04/10/2013, MD;  Location: [facility];  Service: Gynecology;  Laterality: N/A;   SALPINGECTOMY Right 02/2013   op note in epic   Family History  Problem Relation Age of Onset   Asthma Neg Hx    Depression Neg Hx    Diabetes Neg Hx    Heart disease Neg Hx    Hypertension Neg Hx    Obesity Neg Hx    Social History   Tobacco Use   Smoking status: Never   Smokeless tobacco: Never  Vaping Use   Vaping Use: Never used  Substance Use Topics   Alcohol use: No   Drug use: No   Allergies  Allergen Reactions   Food Anaphylaxis and Other (See Comments)    Pt is allergic to Cantalope   Vicodin [Hydrocodone-Acetaminophen] Other (See Comments)    Reaction:  Seizures; has tolerated acetaminophen and other narcotics in the past   Medications Prior to Admission  Medication Sig Dispense Refill Last Dose   aspirin EC 81 MG tablet Take 1 tablet (81 mg total) by mouth daily. Swallow whole. 30 tablet 11 Past Month   ferrous sulfate 325 (65 FE) MG tablet Take 1 tablet (325 mg total) by mouth every other day. 60 tablet 2 Past Month   Prenatal Vit-Fe Fumarate-FA (PRENATAL MULTIVITAMIN) TABS tablet Take 1 tablet by mouth daily at 12 noon.   02/20/2021   Accu-Chek Softclix Lancets lancets Use as instructed 100 each 12    glucose blood test strip Use as instructed 100 each 12     I have reviewed patient's Past Medical Hx, Surgical Hx, Family Hx, Social Hx, medications and allergies.    ROS:  Review of Systems  Constitutional: Negative.   Respiratory: Negative.    Cardiovascular: Negative.   Gastrointestinal: Negative.   Genitourinary: Negative.   Musculoskeletal: Negative.   Neurological:  Positive for headaches.   Physical Exam  Patient Vitals for the past 24 hrs:  BP Temp Temp src Pulse Resp SpO2 Height Weight  02/21/21 1209 -- -- -- -- -- 98 % -- --  02/21/21 1204 -- -- -- -- -- 98 % -- --  02/21/21 1200 (!) 147/84 -- -- (!) 103 -- -- -- --  02/21/21 1159 -- -- -- -- -- 99 % -- --  02/21/21 1154 -- -- -- -- -- 99 % -- --  02/21/21 1145 (!) 144/87 -- -- (!) 102 -- -- -- --  02/21/21 1140 135/86 -- -- (!) 112 -- -- -- --  02/21/21 1134 -- -- -- -- -- 98 % -- --  02/21/21 1129 -- -- -- -- -- 98 % -- --  02/21/21 1124 -- -- -- -- -- 99 % -- --  02/21/21 1122 138/84 -- -- (!) 102 -- -- -- --  02/21/21 1101 (!) 156/87 98.2 F (36.8 C) Oral (!) 111 20 100 % 5\' 4"  (1.626 m) 110.1 kg   Constitutional: well-developed, well-nourished female in no acute distress.  Cardiovascular: normal rate Respiratory: normal effort GI: abd soft, non-tender, gravid MS: extremities nontender, no edema, normal ROM Neurologic: alert and oriented x 4, no focal deficits   FHT: Baseline 145 bpm, moderate variability, 15x15 accelerations present, no decelerations Toco: occastional with ui   Labs: Results for orders placed or performed during the hospital encounter of 02/20/21 (from the past 24 hour(s))  Protein / creatinine ratio, urine     Status: Abnormal   Collection Time: 02/20/21  7:39 PM  Result Value Ref Range   Creatinine, Urine 97.14 mg/dL   Total Protein, Urine 26 mg/dL   Protein Creatinine Ratio 0.27 (H) 0.00 - 0.15 mg/mg[Cre]  Wet prep, genital     Status: Abnormal   Collection Time: 02/20/21  8:25 PM  Result Value Ref Range   Yeast Wet Prep HPF POC NONE SEEN NONE SEEN   Trich, Wet Prep NONE SEEN NONE SEEN   Clue Cells Wet Prep HPF POC NONE SEEN NONE SEEN   WBC,  Wet Prep HPF POC MANY (A) NONE SEEN   Sperm NONE SEEN   Fern Test     Status: Normal   Collection Time: 02/20/21  8:40 PM  Result Value  Ref Range   POCT Fern Test Negative = intact amniotic membranes   CBC     Status: Abnormal   Collection Time: 02/20/21  8:47 PM  Result Value Ref Range   WBC 7.4 4.0 - 10.5 K/uL   RBC 3.89 3.87 - 5.11 MIL/uL   Hemoglobin 10.0 (L) 12.0 - 15.0 g/dL   HCT 81.8 (L) 29.9 - 37.1 %   MCV 82.8 80.0 - 100.0 fL   MCH 25.7 (L) 26.0 - 34.0 pg   MCHC 31.1 30.0 - 36.0 g/dL   RDW 69.6 (H) 78.9 - 38.1 %   Platelets 287 150 - 400 K/uL   nRBC 0.0 0.0 - 0.2 %  Comprehensive metabolic panel     Status: Abnormal   Collection Time: 02/20/21  8:47 PM  Result Value Ref Range   Sodium 136 135 - 145 mmol/L   Potassium 3.5 3.5 - 5.1 mmol/L   Chloride 104 98 - 111 mmol/L   CO2 22 22 - 32 mmol/L   Glucose, Bld 95 70 - 99 mg/dL   BUN <5 (L) 6 - 20 mg/dL   Creatinine, Ser 0.17 0.44 - 1.00 mg/dL   Calcium 8.9 8.9 - 51.0 mg/dL   Total Protein 6.4 (L) 6.5 - 8.1 g/dL   Albumin 2.9 (L) 3.5 - 5.0 g/dL   AST 19 15 - 41 U/L   ALT 10 0 - 44 U/L   Alkaline Phosphatase 85 38 - 126 U/L   Total Bilirubin 0.4 0.3 - 1.2 mg/dL   GFR, Estimated >25 >85 mL/min   Anion gap 10 5 - 15  Resp Panel by RT-PCR (Flu A&B, Covid) Nasopharyngeal Swab     Status: None   Collection Time: 02/20/21 10:16 PM   Specimen: Nasopharyngeal Swab; Nasopharyngeal(NP) swabs in vial transport medium  Result Value Ref Range   SARS Coronavirus 2 by RT PCR NEGATIVE NEGATIVE   Influenza A by PCR NEGATIVE NEGATIVE   Influenza B by PCR NEGATIVE NEGATIVE    Imaging:    MAU Course: Orders Placed This Encounter  Procedures   Diet NPO time specified   Informed Consent Details: Physician/Practitioner Attestation; Transcribe to consent form and obtain patient signature   Initiate Pre-op Protocol   Verify informed consent   Place and maintain sequential compression device   Clip operative site   Labs per  anesthesia   Informed Consent Details: Physician/Practitioner Attestation; Transcribe to consent form and obtain patient signature   Type and screen [facility]   Insert peripheral IV   Admit to Inpatient (patient's expected length of stay will be greater than 2 midnights or inpatient only procedure)   Meds ordered this encounter  Medications   acetaminophen (TYLENOL) tablet 1,000 mg   sodium citrate-citric acid (ORACIT) solution 30 mL    MDM: Elevated BP's, meets criteria for gHTN HA, patient given Tylenol PO Labs from last night normal. PCR 0.27, high normal If HA does not resolve with Tylenol, will consider starting magnesium sulfate per Dr Para March.    Assessment: 1. Obesity in pregnancy, antepartum   2. Encounter for supervision of high-risk pregnancy with multigravida of advanced maternal age   54. Diet controlled gestational diabetes mellitus (GDM) in third trimester     Plan: Proceed to OR for repeat c-section Dr. Para March to assume care of patient      Camelia Eng, MSN, CNM 02/21/2021 12:19 PM

## 2021-02-21 NOTE — Anesthesia Procedure Notes (Signed)
Date/Time: 02/21/2021 1:45 PM Performed by: Graciela Husbands, CRNA Pre-anesthesia Checklist: Patient identified, Emergency Drugs available, Suction available, Patient being monitored and Timeout performed Oxygen Delivery Method: Nasal cannula

## 2021-02-21 NOTE — Progress Notes (Deleted)
Nicole Landry PROCEDURE DATE: 02/21/2021  PREOPERATIVE DIAGNOSES:  Intrauterine pregnancy at [redacted]w[redacted]d weeks gestation GDMA1 GHTN  POSTOPERATIVE DIAGNOSES: The same  PROCEDURE: Repeat Low Transverse Cesarean Section and postpartum IUD placement  SURGEON:  Dr. Milas Hock  ASSISTANT:  Dr. Mariel Aloe  ANESTHESIOLOGY TEAM: Anesthesiologist: Bethena Midget, MD CRNA: Marny Lowenstein, CRNA; Graciela Husbands, CRNA  INDICATIONS: Nicole Landry is a 35 y.o. [phone] at [redacted]w[redacted]d here for cesarean section secondary to the indications listed under preoperative diagnoses; please see preoperative note for further details.  The risks of surgery were discussed with the patient including but were not limited to: bleeding which may require transfusion or reoperation; infection which may require antibiotics; injury to bowel, bladder, ureters or other surrounding organs; injury to the fetus; need for additional procedures including hysterectomy in the event of a life-threatening hemorrhage; formation of adhesions; placental abnormalities wth subsequent pregnancies; incisional problems; thromboembolic phenomenon and other postoperative/anesthesia complications.  The patient concurred with the proposed plan, giving informed written consent for the procedure.    FINDINGS:  Viable female infant in cephalic presentation.  Apgars not yet recorded but baby gave vigorous cry at birth.  Clear amniotic fluid.  Intact placenta, three vessel cord.  Normal uterus, fallopian tubes and ovaries bilaterally.  ANESTHESIA: Spinal INTRAVENOUS FLUIDS: 1800 ml   ESTIMATED BLOOD LOSS: 356 ml URINE OUTPUT:  25 ml SPECIMENS: Placenta sent to pathology COMPLICATIONS: None immediate  PROCEDURE IN DETAIL:  The patient preoperatively received intravenous antibiotics and had sequential compression devices applied to her lower extremities.  She was then taken to the operating room where spinal anesthesia was found to be  adequate. She was then placed in a dorsal supine position with a leftward tilt, and prepped and draped in a sterile manner.  A foley catheter was placed into her bladder and attached to constant gravity.    After an adequate timeout was performed, a Pfannenstiel skin incision was made with scalpel on her preexisting scar and carried through to the underlying layer of fascia. The fascia was incised in the midline, and this incision was extended bilaterally using the Mayo scissors.  Kocher clamps were applied to the superior aspect of the fascial incision and the underlying rectus muscles were dissected off bluntly and sharply.  A similar process was carried out on the inferior aspect of the fascial incision. The rectus muscles were separated in the midline and the peritoneum was entered bluntly. The Alexis self-retaining retractor was introduced into the abdominal cavity.  Attention was turned to the lower uterine segment where a low transverse hysterotomy was made with a scalpel and extended bilaterally bluntly.  The infant was successfully delivered, the cord was clamped and cut after one minute, and the infant was handed over to the awaiting neonatology team. Uterine massage was then administered, and the placenta delivered intact with a three-vessel cord. The uterus was then cleared of clots and debris.    The hysterotomy was closed with 0 Vicryl in a running locked fashion. Two additional figure of eights done on each end fo the incision. Incision inspected and hemostatic.The pelvis was cleared of all clot and debris. The retractor was removed.  The peritoneum was closed with a 0 Vicryl running stitch. The fascia was then closed using 0 PDS in a running fashion.  The subcutaneous layer was irrigated and Due to depth of tissue, it was re-approximated with 2-0 plain gut in a running fashion. The skin was closed with a 4-0 Vicryl subcuticular  stitch. The patient tolerated the procedure well. Sponge, instrument  and needle counts were correct x 3.  She was taken to the recovery room in stable condition.    Milas Hock, MD, FACOG Obstetrician & Gynecologist, [facility] for [facility], [facility]

## 2021-02-22 ENCOUNTER — Encounter (HOSPITAL_COMMUNITY): Payer: Self-pay | Admitting: Obstetrics & Gynecology

## 2021-02-22 LAB — CBC
HCT: 27 % — ABNORMAL LOW (ref 36.0–46.0)
Hemoglobin: 8.4 g/dL — ABNORMAL LOW (ref 12.0–15.0)
MCH: 25.8 pg — ABNORMAL LOW (ref 26.0–34.0)
MCHC: 31.1 g/dL (ref 30.0–36.0)
MCV: 82.8 fL (ref 80.0–100.0)
Platelets: 211 10*3/uL (ref 150–400)
RBC: 3.26 MIL/uL — ABNORMAL LOW (ref 3.87–5.11)
RDW: 16.1 % — ABNORMAL HIGH (ref 11.5–15.5)
WBC: 14.8 10*3/uL — ABNORMAL HIGH (ref 4.0–10.5)
nRBC: 0 % (ref 0.0–0.2)

## 2021-02-22 MED ORDER — FERROUS SULFATE 325 (65 FE) MG PO TABS
325.0000 mg | ORAL_TABLET | ORAL | Status: DC
  Administered 2021-02-22: 325 mg via ORAL
  Filled 2021-02-22: qty 1

## 2021-02-22 MED ORDER — AMMONIA AROMATIC IN INHA
RESPIRATORY_TRACT | Status: AC
  Filled 2021-02-22: qty 10

## 2021-02-22 NOTE — Progress Notes (Signed)
The patient called out due to the inability to void since the removal of the foley catheter. Foley was removed at 0930. The patient was straight In and Out twice due to the inability to void, and both attempts were unsuccessful. The patient requested to sit on a bedpan. RN is unable to bladder scan the patient due to Provera dressing placement.

## 2021-02-22 NOTE — Progress Notes (Signed)
   02/22/21 1510  Output (mL)  Urine 600 mL

## 2021-02-22 NOTE — Progress Notes (Signed)
Attempted to check orthostatic BP and walk patient to the restroom for the first time.  Patient able to stand up, but was not able to ambulate at this time.  Patient states she feels a little dizzy and her pain from 0 to an 8.  Patient sat back down on the bed.  Patient asked if she could try to get up again after breakfast.  Patient mentioned that from previous experience after the foley catheter being removed, patient had a hard time voiding on her own.  Patient is aware that her foley catheter need to come out.  Peri care done on the bed.

## 2021-02-22 NOTE — Lactation Note (Signed)
This note was copied from a baby's chart. Lactation Consultation Note  Patient Name: Nicole Landry [id number] Date: 02/22/2021 Reason for consult: Initial assessment;Early term 37-38.6wks;Hyperbilirubinemia +DAT, GDM Age:36 hours 2% weight loss  LC in to visit with P5 Mom and FOB.  Baby is currently at the breast in cradle hold.  Mom is an experienced breastfeeding Mom and states she is aware of importance of a deep latch to the breast.  LC identified deep jaw extensions with sucking.  Baby currently STS.  Phototherapy begun and blanket laid against baby's back while breastfeeding.   Talked with Mom regarding pumping after breastfeeding to support a full milk supply AND offer EBM by spoon or paced bottle.  Mom understands that newborn jaundice can lead to sleepiness and baby not being as efficient breastfeeding.    LC set up DEBP and encouraged her to pump on initiation setting after breastfeeding and supplement with EBM+/formula per Ped recommendations.  Lactation brochure left in room. Mom aware of lactation support available to her and encouraged her to ask for help prn.    Maternal Data Has patient been taught Hand Expression?: Yes Does the patient have breastfeeding experience prior to this delivery?: Yes How long did the patient breastfeed?: 19 months with 3rd baby, 13 months with 4th baby  Feeding Mother's Current Feeding Choice: Breast Milk  LATCH Score Latch: Grasps breast easily, tongue down, lips flanged, rhythmical sucking.  Audible Swallowing: Spontaneous and intermittent  Type of Nipple: Everted at rest and after stimulation  Comfort (Breast/Nipple): Soft / non-tender  Hold (Positioning): No assistance needed to correctly position infant at breast.  LATCH Score: 10   Lactation Tools Discussed/Used Tools: Pump Breast pump type: Double-Electric Breast Pump Pump Education: Setup, frequency, and cleaning;Milk Storage Reason for Pumping: Support milk supply/  hyperbilirubinemia +DAT Pumping frequency: Encouraged Mom to pump AFTER baby breastfeeds  Interventions Interventions: Breast feeding basics reviewed;Skin to skin;Breast massage;Hand express;DEBP;Hand pump    Consult Status Consult Status: Follow-up Date: 02/23/21 Follow-up type: In-patient    Judee Clara 02/22/2021, 8:17 AM

## 2021-02-22 NOTE — Anesthesia Postprocedure Evaluation (Signed)
Anesthesia Post Note  Patient: Nicole Landry  Procedure(s) Performed: CESAREAN SECTION     Patient location during evaluation: PACU Anesthesia Type: Spinal Level of consciousness: oriented and awake and alert Pain management: pain level controlled Vital Signs Assessment: post-procedure vital signs reviewed and stable Respiratory status: spontaneous breathing, respiratory function stable and patient connected to nasal cannula oxygen Cardiovascular status: blood pressure returned to baseline and stable Postop Assessment: no headache, no backache and no apparent nausea or vomiting Anesthetic complications: no   No notable events documented.  Last Vitals:  Vitals:   02/22/21 0030 02/22/21 0435  BP: 116/67   Pulse: 85   Resp: 18 18  Temp: 37 C 36.8 C  SpO2: 96%     Last Pain:  Vitals:   02/22/21 0505  TempSrc:   PainSc: 6    Pain Goal: Patients Stated Pain Goal: 3 (02/21/21 1735)                 Fanta Wimberley

## 2021-02-22 NOTE — Progress Notes (Signed)
POSTPARTUM PROGRESS NOTE  POD #1  Subjective:  Nicole Landry is a 35 y.o. [id number] s/p repeat C-section at [redacted]w[redacted]d. Today she notes some discomfort with movement. Denies problems with po intake.  Foley still in place. Denies nausea or vomiting. She has no flatus, no BM.  Lochia minimal Denies fever/chills/chest pain/SOB.  no HA, no blurry vision, no RUQ pain  Objective: Blood pressure 116/67, pulse 85, temperature 98.2 F (36.8 C), temperature source Oral, resp. rate 18, height 5\' 4"  (1.626 m), weight 110.1 kg, last menstrual period 06/07/2020, SpO2 96 %, unknown if currently breastfeeding.  Physical Exam:  General: alert, cooperative and no distress Chest: no respiratory distress Heart: regular rate and rhythm Abdomen: soft, nontender, +BS Uterine Fundus: firm, appropriately tender Incision: Prevena in place DVT Evaluation: No calf swelling or tenderness Extremities: minimal edema, SCDs in place Skin: warm, dry  Results for orders placed or performed during the hospital encounter of 02/21/21 (from the past 24 hour(s))  Type and screen  [facility]     Status: None   Collection Time: 02/21/21 12:45 PM  Result Value Ref Range   ABO/RH(D) O POS    Antibody Screen NEG    Sample Expiration      02/24/2021,2359 Performed at [facility], [address] [place], [place]   Glucose, capillary     Status: None   Collection Time: 02/21/21 12:56 PM  Result Value Ref Range   Glucose-Capillary 94 70 - 99 mg/dL  CBC     Status: Abnormal   Collection Time: 02/22/21  5:00 AM  Result Value Ref Range   WBC 14.8 (H) 4.0 - 10.5 K/uL   RBC 3.26 (L) 3.87 - 5.11 MIL/uL   Hemoglobin 8.4 (L) 12.0 - 15.0 g/dL   HCT 02/24/21 (L) 44.9 - 67.5 %   MCV 82.8 80.0 - 100.0 fL   MCH 25.8 (L) 26.0 - 34.0 pg   MCHC 31.1 30.0 - 36.0 g/dL   RDW 91.6 (H) 38.4 - 66.5 %   Platelets 211 150 - 400 K/uL   nRBC 0.0 0.0 - 0.2 %    Assessment/Plan: DENASIA VENN is a 35 y.o.  31 s/p repeat C-section at [redacted]w[redacted]d POD#1 complicated by: 1) Gestational HTN- BP noted to be elevated on 8/19; however improved this am, plan to closely monitor today -currently asymptomatic 2) Postop management -pt notes prior issues with foley removal- plan to remove this am with low threshold for bladder scan -encouraged regular pain medication -encouraged ambulation -Lovenox for DVT prophylaxis -GDMA1- fasting within normal range -Hgb as above, plan to continue with daily iron, may consider IV venofer  Contraception: IUD in place Feeding: breast  Dispo: continue routine postop care as outlined above   LOS: 1 day   9/19, DO Faculty Attending, Center for [facility] 02/22/2021, 6:57 AM

## 2021-02-22 NOTE — Lactation Note (Signed)
This note was copied from a baby's chart. Lactation Consultation Note  Patient Name: Nicole Landry Date: 02/22/2021   Age:36 hours Mother stated checked in with Pediatrician and will offer breast only as urine and stool output adequate.   Maternal Data    Feeding    LATCH Score                    Lactation Tools Discussed/Used    Interventions    Discharge    Consult Status      Nicole Callins  Landry 02/22/2021, 6:37 PM

## 2021-02-23 MED ORDER — SODIUM CHLORIDE 0.9 % IV SOLN
500.0000 mg | Freq: Once | INTRAVENOUS | Status: AC
  Administered 2021-02-23: 500 mg via INTRAVENOUS
  Filled 2021-02-23: qty 25

## 2021-02-23 MED ORDER — SODIUM CHLORIDE 0.9 % IV SOLN
300.0000 mg | Freq: Once | INTRAVENOUS | Status: AC
  Administered 2021-02-23: 300 mg via INTRAVENOUS
  Filled 2021-02-23: qty 15

## 2021-02-23 MED ORDER — NIFEDIPINE ER OSMOTIC RELEASE 30 MG PO TB24
30.0000 mg | ORAL_TABLET | Freq: Every day | ORAL | Status: DC
  Administered 2021-02-23 – 2021-02-24 (×2): 30 mg via ORAL
  Filled 2021-02-23 (×2): qty 1

## 2021-02-23 NOTE — Progress Notes (Addendum)
Post Partum Day 2 - rLTCS  Subjective: Patient reports she is doing okay today. She is having more abdominal pain but has now taken Oxycodone which has helped. She is voiding, eating, and drinking without issue. Her bleeding is appropriate. She denies lightheadedness or dizziness. She is passing flatus.  Objective: Blood pressure (!) 136/91, pulse 96, temperature 98.2 F (36.8 C), temperature source Oral, resp. rate 20, height 5\' 4"  (1.626 m), weight 110.1 kg, last menstrual period 06/07/2020, SpO2 100 %, unknown if currently breastfeeding.  Physical Exam:  General: alert, cooperative, and no distress Lochia: appropriate Uterine Fundus: firm Incision: Prevena in place  DVT Evaluation: no LE edema or calf tenderness to palpation  Recent Labs    02/20/21 2047 02/22/21 0500  HGB 10.0* 8.4*  HCT 32.2* 27.0*    Assessment/Plan: 35 year old 31 s/p rLTCS on POD#2.  Patient is progressing well postpartum and meeting milestones appropriately.   Pain improve with Oxycodone PRN.   #Gestational HTN: BP remains above goal - will start Procardia 30 mg XL and monitor today. No symptoms. #A1GDM: Needs fasting CBG; ordered for tomorrow AM #Acute blood loss anemia: Post op Hgb 8.4. Has needed Venofer infusion in the past for anemia. Venofer ordered.   Feeding: Breast; doing well Contraception: ppIUD placed  Dispo: Plan for discharge POD#3   LOS: 2 days   [id number], MD [facility] Fellow  Faculty Practice 02/23/2021, 11:57 AM

## 2021-02-23 NOTE — Progress Notes (Signed)
Patient ID: Nicole Landry, female   DOB: 1986/04/17, 35 y.o.   MRN: [id number] S: RN notified CNM that pt's IV infiltrated soon after Venofer infusion started. Pt reported sharp pain in her neck when this occurred. Pain has resolved. RN does not think that any of the Venofer infused before IV infiltrated. Pt denies pain at IV site.   CNM consulted Pharmacist who researched Venofer extravasation and found no reports of injury or of Venofer being a vesicant. Only possible side effect is staining.   O:  Patient Vitals for the past 24 hrs:  BP Temp Temp src Pulse Resp SpO2  02/23/21 1325 140/86 98.9 F (37.2 C) Oral 93 19 --  02/23/21 0549 (!) 136/91 98.2 F (36.8 C) Oral 96 20 100 %  02/22/21 2229 114/72 98.6 F (37 C) Oral 97 20 98 %   Pt in NAD Left forearm mildly edematous at former IV site. No drainage, warmth, erythema, tenderness, staining or bleeding.  A: Extravasation of Venofer without evidence of injury. Doubt pt received any of Venofer dose.    P: Discussed very low risk complications and offered options or restarting IV and giving Venofer dose vs switching to PO Iron which would take longer to work. Transfusion not indicated w/ Hgb 8.4 and mildly symptomatic anemia. Pt requests Venofer but requests no more that 2 IV start attempts. Will consult IV team if needed since pt has been a difficult IV start.  Katrinka Blazing, [place], [place] 02/23/2021 7:10 PM

## 2021-02-23 NOTE — Lactation Note (Signed)
This note was copied from a baby's chart. Lactation Consultation Note  Patient Name: Girl Natori Gudino [id number] Date: 02/23/2021   Age:36 hours   LC Note:  Per RN, mother does not desire lactation services.     Maternal Data    Feeding    LATCH Score                    Lactation Tools Discussed/Used    Interventions    Discharge    Consult Status      Alisah Grandberry R Daphene Chisholm 02/23/2021, 3:02 PM

## 2021-02-24 ENCOUNTER — Other Ambulatory Visit (HOSPITAL_COMMUNITY): Payer: Self-pay

## 2021-02-24 LAB — GLUCOSE, CAPILLARY: Glucose-Capillary: 78 mg/dL (ref 70–99)

## 2021-02-24 MED ORDER — ACETAMINOPHEN 500 MG PO TABS
1000.0000 mg | ORAL_TABLET | Freq: Three times a day (TID) | ORAL | 0 refills | Status: DC | PRN
  Filled 2021-02-24: qty 60, 10d supply, fill #0

## 2021-02-24 MED ORDER — NIFEDIPINE ER 60 MG PO TB24
60.0000 mg | ORAL_TABLET | Freq: Every day | ORAL | 0 refills | Status: DC
  Filled 2021-02-24: qty 30, 30d supply, fill #0

## 2021-02-24 MED ORDER — OXYCODONE HCL 5 MG PO TABS
5.0000 mg | ORAL_TABLET | ORAL | 0 refills | Status: DC | PRN
  Filled 2021-02-24: qty 30, 3d supply, fill #0

## 2021-02-24 MED ORDER — IBUPROFEN 800 MG PO TABS
800.0000 mg | ORAL_TABLET | Freq: Three times a day (TID) | ORAL | 0 refills | Status: DC | PRN
  Filled 2021-02-24: qty 30, 10d supply, fill #0

## 2021-02-24 NOTE — Op Note (Signed)
Nicole Landry - This is a duplicate note to the operative note on 8/19 that was incorrectly labeled a procedure note instead of op note.    PROCEDURE DATE: 02/21/2021   PREOPERATIVE DIAGNOSES:  Intrauterine pregnancy at [redacted]w[redacted]d weeks gestation GDMA1 GHTN   POSTOPERATIVE DIAGNOSES: The same   PROCEDURE: Repeat Low Transverse Cesarean Section and postpartum IUD placement   SURGEON:  Dr. Milas Hock   ASSISTANT:  Dr. Mariel Aloe   ANESTHESIOLOGY TEAM: Anesthesiologist: Bethena Midget, MD CRNA: Marny Lowenstein, CRNA; Graciela Husbands, CRNA   INDICATIONS: Nicole Landry is a 35 y.o. [phone] at [redacted]w[redacted]d here for cesarean section secondary to the indications listed under preoperative diagnoses; please see preoperative note for further details.  The risks of surgery were discussed with the patient including but were not limited to: bleeding which may require transfusion or reoperation; infection which may require antibiotics; injury to bowel, bladder, ureters or other surrounding organs; injury to the fetus; need for additional procedures including hysterectomy in the event of a life-threatening hemorrhage; formation of adhesions; placental abnormalities wth subsequent pregnancies; incisional problems; thromboembolic phenomenon and other postoperative/anesthesia complications.  The patient concurred with the proposed plan, giving informed written consent for the procedure.     FINDINGS:  Viable female infant in cephalic presentation.  Apgars not yet recorded but baby gave vigorous cry at birth.  Clear amniotic fluid.  Intact placenta, three vessel cord.  Normal uterus, fallopian tubes and ovaries bilaterally.   ANESTHESIA: Spinal INTRAVENOUS FLUIDS: 1800 ml   ESTIMATED BLOOD LOSS: 356 ml URINE OUTPUT:  25 ml SPECIMENS: Placenta sent to pathology COMPLICATIONS: None immediate   PROCEDURE IN DETAIL:  The patient preoperatively received intravenous antibiotics and had sequential  compression devices applied to her lower extremities.  She was then taken to the operating room where spinal anesthesia was found to be adequate. She was then placed in a dorsal supine position with a leftward tilt, and prepped and draped in a sterile manner.  A foley catheter was placed into her bladder and attached to constant gravity.     After an adequate timeout was performed, a Pfannenstiel skin incision was made with scalpel on her preexisting scar and carried through to the underlying layer of fascia. The fascia was incised in the midline, and this incision was extended bilaterally using the Mayo scissors.  Kocher clamps were applied to the superior aspect of the fascial incision and the underlying rectus muscles were dissected off bluntly and sharply.  A similar process was carried out on the inferior aspect of the fascial incision. The rectus muscles were separated in the midline and the peritoneum was entered bluntly. The Alexis self-retaining retractor was introduced into the abdominal cavity.  Attention was turned to the lower uterine segment where a low transverse hysterotomy was made with a scalpel and extended bilaterally bluntly.  The infant was successfully delivered, the cord was clamped and cut after one minute, and the infant was handed over to the awaiting neonatology team. Uterine massage was then administered, and the placenta delivered intact with a three-vessel cord. The uterus was then cleared of clots and debris.     The hysterotomy was closed with 0 Vicryl in a running locked fashion. Two additional figure of eights done on each end fo the incision. Incision inspected and hemostatic.The pelvis was cleared of all clot and debris. The retractor was removed.  The peritoneum was closed with a 0 Vicryl running stitch. The fascia was then closed using  0 PDS in a running fashion.  The subcutaneous layer was irrigated and Due to depth of tissue, it was re-approximated with 2-0 plain gut in a  running fashion. The skin was closed with a 4-0 Vicryl subcuticular stitch. The patient tolerated the procedure well. Sponge, instrument and needle counts were correct x 3.  She was taken to the recovery room in stable condition.      Milas Hock, MD, FACOG Obstetrician & Gynecologist, [facility] for [facility], [facility]

## 2021-02-24 NOTE — Progress Notes (Signed)
Discharge paperwork competed and prevana changed out.. waiting on FOB to arrive with money to pay copay for medications. Due to arrive around 3pm. Pharmacy to call back around 3 to see if dad is here. Once medications are delivered pt. Will be made into baby patient.

## 2021-02-24 NOTE — Procedures (Deleted)
Nicole Landry - This is a duplicate note to the operative note on 8/19 that was incorrectly labeled a progress note.   PROCEDURE DATE: 02/21/2021  PREOPERATIVE DIAGNOSES:  Intrauterine pregnancy at [redacted]w[redacted]d weeks gestation GDMA1 GHTN  POSTOPERATIVE DIAGNOSES: The same  PROCEDURE: Repeat Low Transverse Cesarean Section and postpartum IUD placement  SURGEON:  Dr. Milas Hock  ASSISTANT:  Dr. Mariel Aloe  ANESTHESIOLOGY TEAM: Anesthesiologist: Bethena Midget, MD CRNA: Marny Lowenstein, CRNA; Graciela Husbands, CRNA  INDICATIONS: Nicole Landry is a 35 y.o. [phone] at [redacted]w[redacted]d here for cesarean section secondary to the indications listed under preoperative diagnoses; please see preoperative note for further details.  The risks of surgery were discussed with the patient including but were not limited to: bleeding which may require transfusion or reoperation; infection which may require antibiotics; injury to bowel, bladder, ureters or other surrounding organs; injury to the fetus; need for additional procedures including hysterectomy in the event of a life-threatening hemorrhage; formation of adhesions; placental abnormalities wth subsequent pregnancies; incisional problems; thromboembolic phenomenon and other postoperative/anesthesia complications.  The patient concurred with the proposed plan, giving informed written consent for the procedure.    FINDINGS:  Viable female infant in cephalic presentation.  Apgars not yet recorded but baby gave vigorous cry at birth.  Clear amniotic fluid.  Intact placenta, three vessel cord.  Normal uterus, fallopian tubes and ovaries bilaterally.  ANESTHESIA: Spinal INTRAVENOUS FLUIDS: 1800 ml   ESTIMATED BLOOD LOSS: 356 ml URINE OUTPUT:  25 ml SPECIMENS: Placenta sent to pathology COMPLICATIONS: None immediate  PROCEDURE IN DETAIL:  The patient preoperatively received intravenous antibiotics and had sequential compression devices applied to her  lower extremities.  She was then taken to the operating room where spinal anesthesia was found to be adequate. She was then placed in a dorsal supine position with a leftward tilt, and prepped and draped in a sterile manner.  A foley catheter was placed into her bladder and attached to constant gravity.    After an adequate timeout was performed, a Pfannenstiel skin incision was made with scalpel on her preexisting scar and carried through to the underlying layer of fascia. The fascia was incised in the midline, and this incision was extended bilaterally using the Mayo scissors.  Kocher clamps were applied to the superior aspect of the fascial incision and the underlying rectus muscles were dissected off bluntly and sharply.  A similar process was carried out on the inferior aspect of the fascial incision. The rectus muscles were separated in the midline and the peritoneum was entered bluntly. The Alexis self-retaining retractor was introduced into the abdominal cavity.  Attention was turned to the lower uterine segment where a low transverse hysterotomy was made with a scalpel and extended bilaterally bluntly.  The infant was successfully delivered, the cord was clamped and cut after one minute, and the infant was handed over to the awaiting neonatology team. Uterine massage was then administered, and the placenta delivered intact with a three-vessel cord. The uterus was then cleared of clots and debris.    The hysterotomy was closed with 0 Vicryl in a running locked fashion. Two additional figure of eights done on each end fo the incision. Incision inspected and hemostatic.The pelvis was cleared of all clot and debris. The retractor was removed.  The peritoneum was closed with a 0 Vicryl running stitch. The fascia was then closed using 0 PDS in a running fashion.  The subcutaneous layer was irrigated and Due to depth of  tissue, it was re-approximated with 2-0 plain gut in a running fashion. The skin was closed  with a 4-0 Vicryl subcuticular stitch. The patient tolerated the procedure well. Sponge, instrument and needle counts were correct x 3.  She was taken to the recovery room in stable condition.    Milas Hock, MD, FACOG Obstetrician & Gynecologist, [facility] for [facility], [facility]

## 2021-02-26 ENCOUNTER — Encounter (HOSPITAL_COMMUNITY): Payer: Self-pay | Admitting: Obstetrics and Gynecology

## 2021-02-26 ENCOUNTER — Observation Stay (HOSPITAL_COMMUNITY)
Admission: AD | Admit: 2021-02-26 | Discharge: 2021-02-27 | Disposition: A | Discharge status: Home / Self Care | Payer: Medicaid Other | Attending: Obstetrics & Gynecology | Admitting: Obstetrics & Gynecology

## 2021-02-26 ENCOUNTER — Inpatient Hospital Stay (HOSPITAL_COMMUNITY): Payer: Medicaid Other

## 2021-02-26 ENCOUNTER — Other Ambulatory Visit: Payer: Self-pay

## 2021-02-26 ENCOUNTER — Other Ambulatory Visit (HOSPITAL_COMMUNITY): Payer: Self-pay

## 2021-02-26 DIAGNOSIS — O165 Unspecified maternal hypertension, complicating the puerperium: Secondary | ICD-10-CM

## 2021-02-26 DIAGNOSIS — R079 Chest pain, unspecified: Secondary | ICD-10-CM

## 2021-02-26 DIAGNOSIS — Z98891 History of uterine scar from previous surgery: Secondary | ICD-10-CM

## 2021-02-26 DIAGNOSIS — R0602 Shortness of breath: Secondary | ICD-10-CM

## 2021-02-26 DIAGNOSIS — I2699 Other pulmonary embolism without acute cor pulmonale: Secondary | ICD-10-CM | POA: Diagnosis present

## 2021-02-26 DIAGNOSIS — Z20822 Contact with and (suspected) exposure to covid-19: Secondary | ICD-10-CM | POA: Diagnosis not present

## 2021-02-26 DIAGNOSIS — O8883 Other embolism in the puerperium: Principal | ICD-10-CM | POA: Insufficient documentation

## 2021-02-26 DIAGNOSIS — O8823 Thromboembolism in the puerperium: Secondary | ICD-10-CM

## 2021-02-26 DIAGNOSIS — O99893 Other specified diseases and conditions complicating puerperium: Secondary | ICD-10-CM | POA: Diagnosis present

## 2021-02-26 HISTORY — DX: Thromboembolism in the puerperium: O88.23

## 2021-02-26 HISTORY — DX: Unspecified maternal hypertension, complicating the puerperium: O16.5

## 2021-02-26 HISTORY — DX: Chest pain, unspecified: R07.9

## 2021-02-26 HISTORY — DX: Shortness of breath: R06.02

## 2021-02-26 LAB — COMPREHENSIVE METABOLIC PANEL
ALT: 36 U/L (ref 0–44)
AST: 42 U/L — ABNORMAL HIGH (ref 15–41)
Albumin: 3 g/dL — ABNORMAL LOW (ref 3.5–5.0)
Alkaline Phosphatase: 74 U/L (ref 38–126)
Anion gap: 10 (ref 5–15)
BUN: <5 mg/dL — ABNORMAL LOW (ref 6–20)
CO2: 26 mmol/L (ref 22–32)
Calcium: 8.9 mg/dL (ref 8.9–10.3)
Chloride: 104 mmol/L (ref 98–111)
Creatinine, Ser: 0.5 mg/dL (ref 0.44–1.00)
GFR, Estimated: >60 mL/min (ref >60)
Glucose, Bld: 106 mg/dL — ABNORMAL HIGH (ref 70–99)
Potassium: 3.3 mmol/L — ABNORMAL LOW (ref 3.5–5.1)
Sodium: 140 mmol/L (ref 135–145)
Total Bilirubin: 0.5 mg/dL (ref 0.3–1.2)
Total Protein: 6.7 g/dL (ref 6.5–8.1)

## 2021-02-26 LAB — CBC
HCT: 29.4 % — ABNORMAL LOW (ref 36.0–46.0)
Hemoglobin: 8.9 g/dL — ABNORMAL LOW (ref 12.0–15.0)
MCH: 25.3 pg — ABNORMAL LOW (ref 26.0–34.0)
MCHC: 30.3 g/dL (ref 30.0–36.0)
MCV: 83.5 fL (ref 80.0–100.0)
Platelets: 396 10*3/uL (ref 150–400)
RBC: 3.52 MIL/uL — ABNORMAL LOW (ref 3.87–5.11)
RDW: 17.5 % — ABNORMAL HIGH (ref 11.5–15.5)
WBC: 9.1 10*3/uL (ref 4.0–10.5)
nRBC: 0 % (ref 0.0–0.2)

## 2021-02-26 LAB — TROPONIN I (HIGH SENSITIVITY): Troponin I (High Sensitivity): 8 ng/L (ref <18)

## 2021-02-26 LAB — RESP PANEL BY RT-PCR (FLU A&B, COVID) ARPGX2
Influenza A by PCR: NEGATIVE
Influenza B by PCR: NEGATIVE
SARS Coronavirus 2 by RT PCR: NEGATIVE

## 2021-02-26 LAB — BRAIN NATRIURETIC PEPTIDE: B Natriuretic Peptide: 18.5 pg/mL (ref 0.0–100.0)

## 2021-02-26 MED ORDER — ONDANSETRON HCL 4 MG PO TABS: 4.0000 mg | ORAL_TABLET | Freq: Four times a day (QID) | ORAL | Status: DC | PRN

## 2021-02-26 MED ORDER — IBUPROFEN 600 MG PO TABS: 600.0000 mg | ORAL_TABLET | Freq: Four times a day (QID) | ORAL | Status: DC | PRN

## 2021-02-26 MED ORDER — LABETALOL HCL 200 MG PO TABS
200.0000 mg | ORAL_TABLET | Freq: Two times a day (BID) | ORAL | 1 refills | Status: DC
  Filled 2021-02-26: qty 60, 30d supply, fill #0

## 2021-02-26 MED ORDER — ONDANSETRON HCL 4 MG/2ML IJ SOLN: 4.0000 mg | Freq: Four times a day (QID) | INTRAMUSCULAR | Status: DC | PRN

## 2021-02-26 MED ORDER — ENOXAPARIN SODIUM 120 MG/0.8ML IJ SOSY
110.0000 mg | PREFILLED_SYRINGE | Freq: Once | INTRAMUSCULAR | Status: AC
  Administered 2021-02-26: 110 mg via SUBCUTANEOUS
  Filled 2021-02-26: qty 0.74

## 2021-02-26 MED ORDER — ALUM & MAG HYDROXIDE-SIMETH 200-200-20 MG/5ML PO SUSP
30.0000 mL | Freq: Once | ORAL | Status: AC
  Administered 2021-02-26: 30 mL via ORAL
  Filled 2021-02-26: qty 30

## 2021-02-26 MED ORDER — DOCUSATE SODIUM 100 MG PO CAPS: 100.0000 mg | ORAL_CAPSULE | Freq: Two times a day (BID) | ORAL | Status: DC | PRN

## 2021-02-26 MED ORDER — IOHEXOL 350 MG/ML SOLN
150.0000 mL | Freq: Once | INTRAVENOUS | Status: AC | PRN
  Administered 2021-02-26: 130 mL via INTRAVENOUS

## 2021-02-26 MED ORDER — LIDOCAINE VISCOUS HCL 2 % MT SOLN
15.0000 mL | Freq: Once | OROMUCOSAL | Status: AC
  Administered 2021-02-26: 15 mL via ORAL
  Filled 2021-02-26: qty 15

## 2021-02-26 MED ORDER — LABETALOL HCL 200 MG PO TABS
200.0000 mg | ORAL_TABLET | Freq: Two times a day (BID) | ORAL | Status: DC
  Administered 2021-02-26 – 2021-02-27 (×3): 200 mg via ORAL
  Filled 2021-02-26 (×3): qty 1

## 2021-02-26 MED ORDER — ENOXAPARIN SODIUM 100 MG/ML IJ SOSY
100.0000 mg | PREFILLED_SYRINGE | Freq: Two times a day (BID) | INTRAMUSCULAR | Status: DC
  Administered 2021-02-26: 100 mg via SUBCUTANEOUS
  Filled 2021-02-26 (×2): qty 1

## 2021-02-26 MED ORDER — IBUPROFEN 600 MG PO TABS
600.0000 mg | ORAL_TABLET | Freq: Once | ORAL | Status: AC
  Administered 2021-02-26: 600 mg via ORAL
  Filled 2021-02-26: qty 1

## 2021-02-26 MED ORDER — ACETAMINOPHEN 325 MG PO TABS: 650.0000 mg | ORAL_TABLET | ORAL | Status: DC | PRN

## 2021-02-26 MED ORDER — ENOXAPARIN SODIUM 100 MG/ML IJ SOSY
100.0000 mg | PREFILLED_SYRINGE | Freq: Two times a day (BID) | INTRAMUSCULAR | 0 refills | Status: DC
  Filled 2021-02-26: qty 60, 30d supply, fill #0

## 2021-02-26 MED ORDER — CYCLOBENZAPRINE HCL 5 MG PO TABS
5.0000 mg | ORAL_TABLET | Freq: Once | ORAL | Status: AC
  Administered 2021-02-26: 5 mg via ORAL
  Filled 2021-02-26: qty 1

## 2021-02-26 NOTE — Care Management (Signed)
Consult for anticoagulant for patient with patient's insurance.  Patent has MCAID and information shared with MD and with [facility] pharmacy.  TOC meds to bed consult made by pharmacist to assist with medication.  Gretchen Short RNC-MNN, BSN Transitions of Care [facility]

## 2021-02-26 NOTE — H&P (Addendum)
Chief Complaint:  Chest Pain   Event Date/Time   First Provider Initiated Contact with Patient 02/26/21 0431     HPI: Nicole Landry is a 35 y.o. [id number] who presents to maternity admissions reporting shortness of breath, heart pounding, and states when she is up she sometimes feels confused.  She also reports sharp pain in substernal chest, pain across upper back, and pain in vein on left side of neck which started immendiately when IV infiltrated while getting Venofer. Marland KitchenCNM note states sharp pain in neck had resolved but patient states it never did. Pt feels that no one  is listening to her.  She reports no urinary symptoms, h/a, n/v, or fever/chills.    HPI RN Note: PT SAYS SHE DEL ON Friday AND WAS D/C Monday  WAS DEL BC OF BP SENT HOME ON BP MEDS- PROCARDIA- LAST DOSE WAS 12 NOON YESTERDAY -SHE DID NOT LIKE THE WAY IT MADE HER FEEL - H/A DIDN'T GO AWAY, COULD FEEL HEART POUNDING . NOW - HER HEART IS POUNDING , SHARP PAIN IN CHEST , PRESSURE IN BACK AND CHEST   Past Medical History: Past Medical History:  Diagnosis Date   Blood transfusion without reported diagnosis    after ectopic   Ectopic pregnancy, tubal    Gestational diabetes mellitus 2011   denies gestational DM with current pregnancy   Ovarian cyst    Status post vacuum-assisted vaginal delivery (5/17) 11/20/2015    Past obstetric history: OB History  Gravida Para Term Preterm AB Living  7 5 5  0 2 5  SAB IAB Ectopic Multiple Live Births  1 0 1 0 5    # Outcome Date GA Lbr Len/2nd Weight Sex Delivery Anes PTL Lv  7 Term 02/21/21 [redacted]w[redacted]d  3645 g F CS-LTranv Spinal  LIV  6 Term 04/12/19 [redacted]w[redacted]d  3950 g M CS-LTranv Spinal  LIV  5 Term 08/18/17 [redacted]w[redacted]d  4060 g M CS-LTranv Spinal  LIV  4 Term 11/20/15 [redacted]w[redacted]d 02:37 / 01:23 3690 g F VBAC, Vacuum Local, EPI  LIV  3 Ectopic 2014             Birth Comments: System Generated. Please review and update pregnancy details.  2 Term 05/26/10    M CS-LTranv   LIV  1 SAB              Obstetric Comments  Pt states does not want a C/S for this Pregnancy.    Past Surgical History: Past Surgical History:  Procedure Laterality Date   BILATERAL SALPINGECTOMY Right 02/08/2013   Procedure:  SALPINGECTOMY;  Surgeon: 04/10/2013, MD;  Location: [facility];  Service: Gynecology;  Laterality: Right;   CESAREAN SECTION     CESAREAN SECTION N/A 08/18/2017   Procedure: Repeat CESAREAN SECTION;  Surgeon: 08/20/2017, MD;  Location: [facility]IRTH[facility] SU[facility];  Service: Obstetrics;  Laterality: N/A;  EDD: 08/24/17 Allergy: Vicodin   CESAREAN SECTION N/A 04/12/2019   Procedure: Repeat CESAREAN SECTION;  Surgeon: 06/12/2019, MD;  Location: [facility];  Service: Obstetrics;  Laterality: N/A;  EDD: 04/17/19 Allergy: Vicodin   CESAREAN SECTION N/A 02/21/2021   Procedure: CESAREAN SECTION;  Surgeon: 02/23/2021, DO;  Location: [facility];  Service: Obstetrics;  Laterality: N/A;   DILATION AND CURETTAGE OF UTERUS     LAPAROSCOPY N/A 02/08/2013   Procedure: LAPAROSCOPY OPERATIVE;  Surgeon: 04/10/2013, MD;  Location: [facility];  Service: Gynecology;  Laterality: N/A;   SALPINGECTOMY Right 02/2013   op  note in epic    Family History: Family History  Problem Relation Age of Onset   Asthma Neg Hx    Depression Neg Hx    Diabetes Neg Hx    Heart disease Neg Hx    Hypertension Neg Hx    Obesity Neg Hx     Social History: Social History   Tobacco Use   Smoking status: Never   Smokeless tobacco: Never  Vaping Use   Vaping Use: Never used  Substance Use Topics   Alcohol use: No   Drug use: No    Allergies:  Allergies  Allergen Reactions   Food Anaphylaxis and Other (See Comments)    Pt is allergic to Cantalope   Vicodin [Hydrocodone-Acetaminophen] Other (See Comments)    Reaction:  Seizures; has tolerated acetaminophen and other narcotics in the past    Meds:  Medications Prior to Admission  Medication Sig Dispense Refill Last Dose   acetaminophen (TYLENOL) 500 MG  tablet Take 2 tablets (1,000 mg total) by mouth every 8 (eight) hours as needed (pain). 60 tablet 0 Past Week   ferrous sulfate 325 (65 FE) MG tablet Take 1 tablet (325 mg total) by mouth every other day. 60 tablet 2 Past Week   ibuprofen (ADVIL) 800 MG tablet Take 1 tablet (800 mg total) by mouth every 8 (eight) hours as needed (pain). 30 tablet 0 02/25/2021   NIFEdipine (ADALAT CC) 60 MG 24 hr tablet Take 1 tablet (60 mg total) by mouth daily. 30 tablet 0 02/25/2021   oxyCODONE (OXY IR/ROXICODONE) 5 MG immediate release tablet Take 1-2 tablets (5-10 mg total) by mouth every 4 (four) hours as needed for moderate pain or severe pain. 30 tablet 0 02/25/2021   Prenatal Vit-Fe Fumarate-FA (PRENATAL MULTIVITAMIN) TABS tablet Take 1 tablet by mouth daily at 12 noon.   Past Week    I have reviewed patient's Past Medical Hx, Surgical Hx, Family Hx, Social Hx, medications and allergies.  ROS:  Review of Systems  Respiratory:  Positive for shortness of breath. Negative for cough.   Cardiovascular:  Positive for chest pain and palpitations (fast heartrate).  Gastrointestinal:  Negative for abdominal pain and nausea.  Musculoskeletal:  Positive for back pain.  Neurological:  Positive for dizziness (at times when gets up).  Other systems negative     Physical Exam   Vitals:   02/26/21 0620 02/26/21 0649 02/26/21 0808 02/26/21 0835  BP: 123/74 131/80 (!) 141/87 (!) 146/93  Pulse: (!) 105 (!) 104 100 100  Resp:   18   Temp:   98.3 F (36.8 C)   TempSrc:   Oral   SpO2:   100%     Constitutional: Well-developed, well-nourished female in no acute distress.  Cardiovascular: normal rate and rhythm, no ectopy audible, S1 & S2 heard, no murmur Neck:   Tender over vein in left lateral/posterior neck Respiratory: normal effort, no distress. Lungs CTAB with no wheezes or crackles GI: Abd soft, appropriately tender.  Nondistended.  No rebound, No guarding. Incision healing.  Vacuum pump attached  MS:  Extremities nontender, no edema, normal ROM   Tender over left forearm (infiltration area) Neurologic: Alert and oriented x 4.   Grossly nonfocal. GU: Neg CVAT. Skin:  Warm and Dry Psych:  Affect appropriate.    Labs: Results for orders placed or performed during the hospital encounter of 02/26/21 (from the past 24 hour(s))  CBC     Status: Abnormal   Collection Time: 02/26/21  5:02  AM  Result Value Ref Range   WBC 9.1 4.0 - 10.5 K/uL   RBC 3.52 (L) 3.87 - 5.11 MIL/uL   Hemoglobin 8.9 (L) 12.0 - 15.0 g/dL   HCT 47.829.4 (L) 29.536.0 - 62.146.0 %   MCV 83.5 80.0 - 100.0 fL   MCH 25.3 (L) 26.0 - 34.0 pg   MCHC 30.3 30.0 - 36.0 g/dL   RDW 30.817.5 (H) 65.711.5 - 84.615.5 %   Platelets 396 150 - 400 K/uL   nRBC 0.0 0.0 - 0.2 %  Comprehensive metabolic panel     Status: Abnormal   Collection Time: 02/26/21  5:02 AM  Result Value Ref Range   Sodium 140 135 - 145 mmol/L   Potassium 3.3 (L) 3.5 - 5.1 mmol/L   Chloride 104 98 - 111 mmol/L   CO2 26 22 - 32 mmol/L   Glucose, Bld 106 (H) 70 - 99 mg/dL   BUN <5 (L) 6 - 20 mg/dL   Creatinine, Ser 9.620.50 0.44 - 1.00 mg/dL   Calcium 8.9 8.9 - 95.210.3 mg/dL   Total Protein 6.7 6.5 - 8.1 g/dL   Albumin 3.0 (L) 3.5 - 5.0 g/dL   AST 42 (H) 15 - 41 U/L   ALT 36 0 - 44 U/L   Alkaline Phosphatase 74 38 - 126 U/L   Total Bilirubin 0.5 0.3 - 1.2 mg/dL   GFR, Estimated >84>60 >13>60 mL/min   Anion gap 10 5 - 15  Troponin I (High Sensitivity)     Status: None   Collection Time: 02/26/21  5:02 AM  Result Value Ref Range   Troponin I (High Sensitivity) 8 <18 ng/L  Brain natriuretic peptide     Status: None   Collection Time: 02/26/21  5:02 AM  Result Value Ref Range   B Natriuretic Peptide 18.5 0.0 - 100.0 pg/mL    --/--/O POS (08/19 1245)  Imaging:  CT Angio Chest PE W and/or Wo Contrast  Result Date: 02/26/2021 CLINICAL DATA:  PE suspected. Postpartum. Shortness of breath on exertion and midline upper chest pain. EXAM: CT ANGIOGRAPHY CHEST WITH CONTRAST TECHNIQUE:  Multidetector CT imaging of the chest was performed using the standard protocol during bolus administration of intravenous contrast. Multiplanar CT image reconstructions and MIPs were obtained to evaluate the vascular anatomy. CONTRAST:  130mL OMNIPAQUE IOHEXOL 350 MG/ML SOLN COMPARISON:  None. FINDINGS: Cardiovascular: Filling defects are noted in the segmental and subsegmental arteries of the right lower lobe (series 7, images 183, 187, 207), left lower lobe (series 7, image 211) and left upper lobe (series 7, image 106). Questionable filling defect in the left upper lobe anterior segmental artery (series 7, image 122). Filling defect is also noted in the lateral right middle lobe segmental artery (series 7, image 173. No CT evidence of right heart strain. No abnormal pericardial fluid. Mediastinum/Nodes: No enlarged mediastinal, hilar, or axillary lymph nodes. Thyroid gland, trachea, and esophagus demonstrate no significant findings. Lungs/Pleura: Lungs are clear. No pleural effusion or pneumothorax. Upper Abdomen: No acute abnormality. Musculoskeletal: No chest wall abnormality. No acute or significant osseous findings. Review of the MIP images confirms the above findings. IMPRESSION: 1. Segmental and subsegmental pulmonary emboli in the left upper, right middle, and bilateral lower lobes. No CT findings of right heart strain. 2.  Lungs are clear. These results were called by telephone at the time of interpretation on 02/26/2021 at 8:41 am and 9:05 am to provider Lexine Batonelita Admir Candelas, who verbally acknowledged these results. Electronically Signed   By:  Sherron Ales M.D.   On: 02/26/2021 09:08     MAU Course/MDM: I have ordered labs as follows: Troponin, BNP, CBC, CMET Imaging ordered: CT Angiogram, EKG (sinus tachycardia, read by Cardiology) Treatments in MAU included GI cocktail which did not relieve chest pain. .    Assessment: Postoperative Day #5 Chest pain Upper back pain Shortness of  breath Tachycardia/heart racing Left neck tenderness  Report given to R. Arita Miss, CNM @0750   CNM, MSN Certified Nurse-Midwife 02/26/2021 4:32 AM  *TC from Dr. 02/28/2021 in radiology @ [phone] - called to report CT findings and the need to repeat study d/t contrast issues.  TC to Dr. [id number] @ [phone] to notify of radiology findings.  Return TC from Dr. [id number] @ [phone] to report (+) PE bilaterally -- final report uploaded.  Dr. [id number] on unit - notified of radiology report of (+) PE. Dr. Charlotta Newton assumes care of patient.   0845: Patient notified of radiologist's preliminary report. Patient reports she is not going to take Procardia any longer (she took it once), because she did not like the way it made her feel. Discussed possibly switching to Norvasc. Offered dose now to see how she would tolerate it - declined at this time, she would like to wait.  Charlotta Newton: Dr. 0623 at bedside discussing results from CT and POC with patient  Plan: PE (pulmonary thromboembolism) (HCC) Shortness of breath  Chest pain  Status post cesarean delivery Postpartum hypertension  - Admit to OBSCU - Routine admission orders - Lovenox per pharmacy protocol - Start Norvasc 10 mg po daily - Dr. Charlotta Newton assumed care of patient at time of admission   Charlotta Newton, [facility] 02/26/2021 9:34 AM

## 2021-02-26 NOTE — Care Management Note (Signed)
Case Management Note  Patient Details  Name: Nicole Landry MRN: [id number] Date of Birth: 09/22/1985  Subjective/Objective:                  PE (pulmonary thromboembolism  Action/Plan:  Lovenox for home use  Discharge planning Services  [facility]   Additional Comments: CM received call from pharmacy Alinda Money that patient will need assistance with medications for home use. CM talked Chiropodist Steward Drone and she approved MATCH for this patient for home meds.  Curently insurance is declining meds for home and CM talked to patient and she is working with them to get that fixed.  MATCH given.  Meds will be delivered to patient prior to discharge.  Geoffery Lyons, RN 02/26/2021, 3:57 PM

## 2021-02-26 NOTE — MAU Note (Signed)
PT SAYS SHE DEL ON Friday AND WAS D/C Monday  WAS DEL BC OF BP SENT HOME ON BP MEDS- PROCARDIA- LAST DOSE WAS 12 NOON YESTERDAY -SHE DID NOT LIKE THE WAY IT MADE HER FEEL - H/A DIDN'T GO AWAY, COULD FEEL HEART POUNDING . NOW - HER HEART IS POUNDING , SHARP PAIN IN CHEST , PRESSURE IN BACK AND CHEST

## 2021-02-26 NOTE — MAU Provider Note (Deleted)
Chief Complaint:  Chest Pain   Event Date/Time   First Provider Initiated Contact with Patient 02/26/21 0431     HPI: Nicole Landry is a 35 y.o. [id number] who presents to maternity admissions reporting shortness of breath, heart pounding, and states when she is up she sometimes feels confused.  She also reports sharp pain in substernal chest, pain across upper back, and pain in vein on left side of neck which started immendiately when IV infiltrated while getting Venofer. Marland KitchenCNM note states sharp pain in neck had resolved but patient states it never did. Pt feels that no one  is listening to her.  She reports no urinary symptoms, h/a, n/v, or fever/chills.    Chest Pain  This is a new problem. The current episode started in the past 7 days. The problem occurs constantly. The problem has been unchanged. The quality of the pain is described as sharp. The pain radiates to the upper back. Associated symptoms include back pain, dizziness (at times when gets up), palpitations (fast heartrate) and shortness of breath. Pertinent negatives include no abdominal pain, cough or nausea. The pain is aggravated by movement and walking.  Other Chronicity: Shortness of breath. The current episode started in the past 7 days. The problem has been waxing and waning. Associated symptoms include chest pain. Pertinent negatives include no abdominal pain, coughing or nausea. The symptoms are aggravated by exertion. She has tried nothing for the symptoms.  RN Note: PT SAYS SHE DEL ON Friday AND WAS D/C Monday  WAS DEL BC OF BP SENT HOME ON BP MEDS- PROCARDIA- LAST DOSE WAS 12 NOON YESTERDAY -SHE DID NOT LIKE THE WAY IT MADE HER FEEL - H/A DIDN'T GO AWAY, COULD FEEL HEART POUNDING . NOW - HER HEART IS POUNDING , SHARP PAIN IN CHEST , PRESSURE IN BACK AND CHEST   Past Medical History: Past Medical History:  Diagnosis Date   Blood transfusion without reported diagnosis    after ectopic   Ectopic pregnancy, tubal     Gestational diabetes mellitus 2011   denies gestational DM with current pregnancy   Ovarian cyst    Status post vacuum-assisted vaginal delivery (5/17) 11/20/2015    Past obstetric history: OB History  Gravida Para Term Preterm AB Living  7 5 5  0 2 5  SAB IAB Ectopic Multiple Live Births  1 0 1 0 5    # Outcome Date GA Lbr Len/2nd Weight Sex Delivery Anes PTL Lv  7 Term 02/21/21 [redacted]w[redacted]d  3645 g F CS-LTranv Spinal  LIV  6 Term 04/12/19 [redacted]w[redacted]d  3950 g M CS-LTranv Spinal  LIV  5 Term 08/18/17 [redacted]w[redacted]d  4060 g M CS-LTranv Spinal  LIV  4 Term 11/20/15 [redacted]w[redacted]d 02:37 / 01:23 3690 g F VBAC, Vacuum Local, EPI  LIV  3 Ectopic 2014             Birth Comments: System Generated. Please review and update pregnancy details.  2 Term 05/26/10    M CS-LTranv   LIV  1 SAB             Obstetric Comments  Pt states does not want a C/S for this Pregnancy.    Past Surgical History: Past Surgical History:  Procedure Laterality Date   BILATERAL SALPINGECTOMY Right 02/08/2013   Procedure:  SALPINGECTOMY;  Surgeon: 04/10/2013, MD;  Location: [facility];  Service: Gynecology;  Laterality: Right;   CESAREAN SECTION     CESAREAN SECTION N/A 08/18/2017   Procedure:  Repeat CESAREAN SECTION;  Surgeon: Shea Evans, MD;  Location: [facility]IRTH[facility] SU[facility];  Service: Obstetrics;  Laterality: N/A;  EDD: 08/24/17 Allergy: Vicodin   CESAREAN SECTION N/A 04/12/2019   Procedure: Repeat CESAREAN SECTION;  Surgeon: Shea Evans, MD;  Location: [facility];  Service: Obstetrics;  Laterality: N/A;  EDD: 04/17/19 Allergy: Vicodin   CESAREAN SECTION N/A 02/21/2021   Procedure: CESAREAN SECTION;  Surgeon: Myna Hidalgo, DO;  Location: [facility];  Service: Obstetrics;  Laterality: N/A;   DILATION AND CURETTAGE OF UTERUS     LAPAROSCOPY N/A 02/08/2013   Procedure: LAPAROSCOPY OPERATIVE;  Surgeon: Antionette Char, MD;  Location: [facility];  Service: Gynecology;  Laterality: N/A;   SALPINGECTOMY Right 02/2013   op note in epic     Family History: Family History  Problem Relation Age of Onset   Asthma Neg Hx    Depression Neg Hx    Diabetes Neg Hx    Heart disease Neg Hx    Hypertension Neg Hx    Obesity Neg Hx     Social History: Social History   Tobacco Use   Smoking status: Never   Smokeless tobacco: Never  Vaping Use   Vaping Use: Never used  Substance Use Topics   Alcohol use: No   Drug use: No    Allergies:  Allergies  Allergen Reactions   Food Anaphylaxis and Other (See Comments)    Pt is allergic to Cantalope   Vicodin [Hydrocodone-Acetaminophen] Other (See Comments)    Reaction:  Seizures; has tolerated acetaminophen and other narcotics in the past    Meds:  Medications Prior to Admission  Medication Sig Dispense Refill Last Dose   acetaminophen (TYLENOL) 500 MG tablet Take 2 tablets (1,000 mg total) by mouth every 8 (eight) hours as needed (pain). 60 tablet 0    ferrous sulfate 325 (65 FE) MG tablet Take 1 tablet (325 mg total) by mouth every other day. 60 tablet 2    ibuprofen (ADVIL) 800 MG tablet Take 1 tablet (800 mg total) by mouth every 8 (eight) hours as needed (pain). 30 tablet 0    NIFEdipine (ADALAT CC) 60 MG 24 hr tablet Take 1 tablet (60 mg total) by mouth daily. 30 tablet 0    oxyCODONE (OXY IR/ROXICODONE) 5 MG immediate release tablet Take 1-2 tablets (5-10 mg total) by mouth every 4 (four) hours as needed for moderate pain or severe pain. 30 tablet 0    Prenatal Vit-Fe Fumarate-FA (PRENATAL MULTIVITAMIN) TABS tablet Take 1 tablet by mouth daily at 12 noon.       I have reviewed patient's Past Medical Hx, Surgical Hx, Family Hx, Social Hx, medications and allergies.  ROS:  Review of Systems  Respiratory:  Positive for shortness of breath. Negative for cough.   Cardiovascular:  Positive for chest pain and palpitations (fast heartrate).  Gastrointestinal:  Negative for abdominal pain and nausea.  Musculoskeletal:  Positive for back pain.  Neurological:  Positive  for dizziness (at times when gets up).  Other systems negative     Physical Exam   Vitals:   02/26/21 0439  BP: (!) 135/91  Pulse: (!) 113    Constitutional: Well-developed, well-nourished female in no acute distress.  Cardiovascular: normal rate and rhythm, no ectopy audible, S1 & S2 heard, no murmur Neck:   Tender over vein in left lateral/posterior neck Respiratory: normal effort, no distress. Lungs CTAB with no wheezes or crackles GI: Abd soft, appropriately tender.  Nondistended.  No rebound,  No guarding. Incision healing.  Vacuum pump attached  MS: Extremities nontender, no edema, normal ROM   Tender over left forearm (infiltration area) Neurologic: Alert and oriented x 4.   Grossly nonfocal. GU: Neg CVAT. Skin:  Warm and Dry Psych:  Affect appropriate.    Labs: Results for orders placed or performed during the hospital encounter of 02/26/21 (from the past 24 hour(s))  CBC     Status: Abnormal   Collection Time: 02/26/21  5:02 AM  Result Value Ref Range   WBC 9.1 4.0 - 10.5 K/uL   RBC 3.52 (L) 3.87 - 5.11 MIL/uL   Hemoglobin 8.9 (L) 12.0 - 15.0 g/dL   HCT 37.1 (L) 06.2 - 69.4 %   MCV 83.5 80.0 - 100.0 fL   MCH 25.3 (L) 26.0 - 34.0 pg   MCHC 30.3 30.0 - 36.0 g/dL   RDW 85.4 (H) 62.7 - 03.5 %   Platelets 396 150 - 400 K/uL   nRBC 0.0 0.0 - 0.2 %  Comprehensive metabolic panel     Status: Abnormal   Collection Time: 02/26/21  5:02 AM  Result Value Ref Range   Sodium 140 135 - 145 mmol/L   Potassium 3.3 (L) 3.5 - 5.1 mmol/L   Chloride 104 98 - 111 mmol/L   CO2 26 22 - 32 mmol/L   Glucose, Bld 106 (H) 70 - 99 mg/dL   BUN <5 (L) 6 - 20 mg/dL   Creatinine, Ser 0.09 0.44 - 1.00 mg/dL   Calcium 8.9 8.9 - 38.1 mg/dL   Total Protein 6.7 6.5 - 8.1 g/dL   Albumin 3.0 (L) 3.5 - 5.0 g/dL   AST 42 (H) 15 - 41 U/L   ALT 36 0 - 44 U/L   Alkaline Phosphatase 74 38 - 126 U/L   Total Bilirubin 0.5 0.3 - 1.2 mg/dL   GFR, Estimated >82 >99 mL/min   Anion gap 10 5 - 15   Troponin I (High Sensitivity)     Status: None   Collection Time: 02/26/21  5:02 AM  Result Value Ref Range   Troponin I (High Sensitivity) 8 <18 ng/L  Brain natriuretic peptide     Status: None   Collection Time: 02/26/21  5:02 AM  Result Value Ref Range   B Natriuretic Peptide 18.5 0.0 - 100.0 pg/mL    --/--/O POS (08/19 1245)  Imaging:    MAU Course/MDM: I have ordered labs as follows: Troponin, BNP, CBC, CMET Imaging ordered: CT Angiogram, EKG (sinus tachycardia, read by Cardiology) Treatments in MAU included GI cocktail which did not relieve chest pain. .    Assessment: Postoperative Day #5 Chest pain Upper back pain Shortness of breath Tachycardia/heart racing Left neck tenderness  Plan: Report given to Dr Crissie Reese.   Wynelle Bourgeois CNM, MSN Certified Nurse-Midwife 02/26/2021 4:32 AM

## 2021-02-26 NOTE — Progress Notes (Signed)
ANTICOAGULATION CONSULT NOTE - Initial Consult  Pharmacy Consult for lovenox dosing Indication: pulmonary embolus  Allergies  Allergen Reactions   Food Anaphylaxis and Other (See Comments)    Pt is allergic to Cantalope   Vicodin [Hydrocodone-Acetaminophen] Other (See Comments)    Reaction:  Seizures; has tolerated acetaminophen and other narcotics in the past    Patient Measurements: Weight: 104.2 kg (229 lb 11.2 oz)   Vital Signs: Temp: 98.3 F (36.8 C) (08/24 0941) Temp Source: Oral (08/24 0941) BP: 153/91 (08/24 0941) Pulse Rate: 100 (08/24 0941)  Labs: Recent Labs    02/26/21 0502  HGB 8.9*  HCT 29.4*  PLT 396  CREATININE 0.50  TROPONINIHS 8    Estimated Creatinine Clearance: 115.4 mL/min (by C-G formula based on SCr of 0.5 mg/dL).   Medical History: Past Medical History:  Diagnosis Date   Blood transfusion without reported diagnosis    after ectopic   Ectopic pregnancy, tubal    Gestational diabetes mellitus 2011   denies gestational DM with current pregnancy   Ovarian cyst    Status post vacuum-assisted vaginal delivery (5/17) 11/20/2015    Medications:  Scheduled:   enoxaparin (LOVENOX) injection  100 mg Subcutaneous Q12H   labetalol  200 mg Oral BID    Goal of Therapy:  Anti-Xa level 0.6-1 units/ml 4hrs after LMWH dose given Monitor platelets by anticoagulation protocol: Yes   Plan:  Lovenox 100mg  (~1mg /kg) SQ q12h. LMWH level will be ordered for Thurs.  02/26/2021,11:30 AM

## 2021-02-26 NOTE — Plan of Care (Signed)
  Problem: Education: Goal: Knowledge of General Education information will improve Description: Including pain rating scale, medication(s)/side effects and non-pharmacologic comfort measures Outcome: Completed/Met

## 2021-02-27 ENCOUNTER — Ambulatory Visit: Payer: Self-pay

## 2021-02-27 ENCOUNTER — Encounter: Payer: Medicaid Other | Admitting: Medical

## 2021-02-27 ENCOUNTER — Ambulatory Visit: Payer: Medicaid Other

## 2021-02-27 DIAGNOSIS — R079 Chest pain, unspecified: Secondary | ICD-10-CM | POA: Diagnosis not present

## 2021-02-27 DIAGNOSIS — O8823 Thromboembolism in the puerperium: Secondary | ICD-10-CM

## 2021-02-27 DIAGNOSIS — O165 Unspecified maternal hypertension, complicating the puerperium: Secondary | ICD-10-CM

## 2021-02-27 DIAGNOSIS — I2699 Other pulmonary embolism without acute cor pulmonale: Secondary | ICD-10-CM | POA: Diagnosis not present

## 2021-02-27 DIAGNOSIS — R0602 Shortness of breath: Secondary | ICD-10-CM

## 2021-02-27 NOTE — Discharge Summary (Signed)
Physician Discharge Summary  Patient ID: Nicole Landry MRN: [id number] DOB/AGE: Aug 09, 1985 35 y.o.  Admit date: 02/26/2021 Discharge date: 02/27/2021  Admission Diagnoses:Active Problems:   PE (pulmonary thromboembolism) (HCC)   Postpartum hypertension   Chest pain   Shortness of breath   Obstetric pulmonary embolism, postpartum  Discharge Diagnoses:  Active Problems:   PE (pulmonary thromboembolism) (HCC)   Postpartum hypertension   Chest pain   Shortness of breath   Obstetric pulmonary embolism, postpartum   Discharged Condition: good  Hospital Course: Nicole Landry is a 35 y.o. [id number] who presents to maternity admissions reporting shortness of breath, heart pounding, and states when she is up she sometimes feels confused.  She also reports sharp pain in substernal chest, pain across upper back, and pain in vein on left side of neck which started immendiately when IV infiltrated while getting Venofer. Marland KitchenCNM note states sharp pain in neck had resolved but patient states it never did. Pt feels that no one  is listening to her.  She reports no urinary symptoms, h/a, n/v, or fever/chills She was diagnosed with pulmonary embolism.   Reviewed plan to start on therapeutic Lovenox and to continue x 3 mos.  Additionally elevated BP noted- pt has not been regularly taking Procardia- did not like the way she made her feel. Plan to transition to Labetalol.   Plan to place in observation- pending O2 levels with activity, BP management and ability to start Lovenox in home may monitor overnight. She did well and was discharged 02/27/21 Consults: None  Significant Diagnostic Studies: radiology: CT scan: angio chest  Treatments: Lovenox  Discharge Exam: Blood pressure 137/89, pulse 96, temperature 98.4 F (36.9 C), temperature source Oral, resp. rate 18, height 5\' 4"  (1.626 m), weight 104.2 kg, last menstrual period 06/07/2020, SpO2 98 %, currently breastfeeding. General appearance:  alert, cooperative, and no distress Neck: supple, symmetrical, trachea midline Resp: normal effort Extremities: extremities normal, atraumatic, no cyanosis or edema  Disposition: Discharge disposition: 01-Home or Self Care       Discharge Instructions     Discharge patient   Complete by: As directed    Discharge disposition: 01-Home or Self Care   Discharge patient date: 02/27/2021      Allergies as of 02/27/2021       Reactions   Food Anaphylaxis, Other (See Comments)   Pt is allergic to Cantalope   Vicodin [hydrocodone-acetaminophen] Other (See Comments)   Reaction:  Seizures; has tolerated acetaminophen and other narcotics in the past        Medication List     STOP taking these medications    ibuprofen 800 MG tablet Commonly known as: ADVIL   NIFEdipine 60 MG 24 hr tablet Commonly known as: ADALAT CC       TAKE these medications    Acetaminophen Extra Strength 500 MG tablet Generic drug: acetaminophen Take 2 tablets (1,000 mg total) by mouth every 8 (eight) hours as needed (pain).   enoxaparin 100 MG/ML injection Commonly known as: LOVENOX Inject 1 mL (100 mg total) into the skin every 12 (twelve) hours.   ferrous sulfate 325 (65 FE) MG tablet Take 1 tablet (325 mg total) by mouth every other day.   labetalol 200 MG tablet Commonly known as: NORMODYNE Take 1 tablet (200 mg total) by mouth 2 (two) times daily.   oxyCODONE 5 MG immediate release tablet Commonly known as: Oxy IR/ROXICODONE Take 1-2 tablets (5-10 mg total) by mouth every 4 (four) hours as needed  for moderate pain or severe pain.   prenatal multivitamin Tabs tablet Take 1 tablet by mouth daily at 12 noon.        Follow-up Information     [facility] for [facility] at [facility] Follow up.   Specialty: Obstetrics and Gynecology Why: Follow up as scheduled on 03/06/21 Contact information: [address] [place]  [address] [phone]                Signed: Scheryl Darter 02/27/2021, 6:33 AM

## 2021-02-27 NOTE — Progress Notes (Signed)
AVS and medication admin reviewed w/ pt and all questions answered. Vitals WNLs. Bedside valuables returned. Pt ambulated off unit w/ SO.

## 2021-02-27 NOTE — Progress Notes (Signed)
Education on Lovenox taught to pt w/ teach back method. Satisfactory self injection administration by pt observed. All questions answered.

## 2021-03-03 ENCOUNTER — Telehealth (HOSPITAL_COMMUNITY): Payer: Self-pay

## 2021-03-03 NOTE — Telephone Encounter (Signed)
"  I've had some things that I've been dealing with but I was trying to wait tell my appointment on Thursday with my OB-GYN to address them. I've been having some weakness and tingling in my left arm and numbness in my fingers. It feels like when someone blows up a BP cuff on your arm to much and your arm goes numb and tingles. I still have pain in my chest sometimes but I had that when I was in the hospital. I was readmitted to the hospital after I had my baby with a pulmonary embolism." RN asks patient if she has been checking her BP at all? RN reviewed pre-eclampsia and symptoms.  "They put me on a BP medication for pre-eclamsia. I've been checking my BP. It's been on and off high." RN told patient to come to Maternity Admissions Unit at [facility] to get seen. RN told patient with the concerns that she is verbalizing to me that it would be safe to get seen and make sure everything is ok. RN asks patient if she has someone to bring her to the hospital and she states that she does.  "She's doing good. She is eating and gaining weight. She sleeps in a bassient."  RN reviewed ABC's of safe sleep with patient. Patient declines any questions or concerns about baby.  EPDS score is 5.   Marcelino Duster [facility] 03/03/2021,1818

## 2021-03-06 ENCOUNTER — Ambulatory Visit (INDEPENDENT_AMBULATORY_CARE_PROVIDER_SITE_OTHER): Payer: Medicaid Other | Admitting: Obstetrics and Gynecology

## 2021-03-06 ENCOUNTER — Encounter: Payer: Self-pay | Admitting: Obstetrics and Gynecology

## 2021-03-06 ENCOUNTER — Ambulatory Visit: Payer: Medicaid Other

## 2021-03-06 ENCOUNTER — Other Ambulatory Visit: Payer: Self-pay

## 2021-03-06 VITALS — BP 125/83 | HR 88 | Wt 224.6 lb

## 2021-03-06 DIAGNOSIS — O8823 Thromboembolism in the puerperium: Secondary | ICD-10-CM

## 2021-03-06 DIAGNOSIS — M792 Neuralgia and neuritis, unspecified: Secondary | ICD-10-CM | POA: Insufficient documentation

## 2021-03-06 DIAGNOSIS — Z5189 Encounter for other specified aftercare: Secondary | ICD-10-CM | POA: Insufficient documentation

## 2021-03-06 DIAGNOSIS — G5602 Carpal tunnel syndrome, left upper limb: Secondary | ICD-10-CM | POA: Insufficient documentation

## 2021-03-06 HISTORY — DX: Encounter for other specified aftercare: Z51.89

## 2021-03-06 HISTORY — DX: Carpal tunnel syndrome, left upper limb: G56.02

## 2021-03-06 HISTORY — DX: Neuralgia and neuritis, unspecified: M79.2

## 2021-03-06 NOTE — Progress Notes (Signed)
  CC: wound check, PE follow up Subjective:    Patient ID: Nicole Landry, female    DOB: 18-Jan-1986, 35 y.o.   MRN: [id number]  HPI 35 yo G7P5 seen for 2 week wound check, BP check and follow up of PE treatment.  Preveena wound vacuum removed.  Significant adhesive still noted on abdomen.  Pt will remove the residual on her own.  She continues with therapeutic lovenox BID.  Pt does note some left arm and hand weakness with tingling.   Review of Systems  Constitutional: Negative.   Neurological:  Negative for weakness and numbness.       Decreased left arm strength      Objective:   Physical Exam Cardiovascular:     Rate and Rhythm: Normal rate and regular rhythm.     Heart sounds: Normal heart sounds.  Pulmonary:     Effort: Pulmonary effort is normal.     Breath sounds: Normal breath sounds.  Neurological:     Comments: Positive tennel's test and phalens sign.  Left arm subjectively weaker than right.  Decreased grip strength in left hand.  Cranial nerves grossly intact to sensation and motor.   Vitals:   03/06/21 0920  BP: 125/83  Pulse: 88   Wound: well approximated, C/D/I      Assessment & Plan:   1. Obstetric pulmonary embolism, postpartum Patient continues with lovenox times 3 months.  She may need hematology follow up after her treatment is completed  2. Encounter for postpartum visit Full postpartum visit in 4 weeks with 2 hour GTT  3. Carpal tunnel syndrome of left wrist See physical exam, suspcious for left carpal tunnel syndrome - Ambulatory referral to Neurology  4. Neuralgia  - Ambulatory referral to Neurology  5. Visit for wound check Wound clean dry and intact  F/u in 4 weeks for postparum visit    Warden Fillers, MD Faculty Attending, Center for [facility]

## 2021-03-06 NOTE — Progress Notes (Signed)
New complaint of numbness, tingling, and weakness in left arm and hand beginning over the weekend. Same day, began having numbness and tingling in lips. Symptoms in lips went away two days ago. Still reports numbness/tingling in left arm.   Fleet Contras RN 03/06/21

## 2021-03-07 ENCOUNTER — Telehealth: Payer: Self-pay | Admitting: Pharmacist

## 2021-03-07 ENCOUNTER — Inpatient Hospital Stay (HOSPITAL_COMMUNITY)
Admission: RE | Admit: 2021-03-07 | Payer: Medicaid Other | Source: Home / Self Care | Admitting: Obstetrics & Gynecology

## 2021-03-07 ENCOUNTER — Other Ambulatory Visit (HOSPITAL_COMMUNITY): Payer: Self-pay

## 2021-03-07 NOTE — Telephone Encounter (Signed)
Pharmacy Transitions of Care Follow-up Telephone Call  Date of discharge: 02/26/2021  Discharge Diagnosis: PE  How have you been since you were released from the hospital? Patient is doing okay since her discharge.  She had a follow up with Dr.Bass on 03/06/2021   Medication changes made at discharge:  - START: Enoxaparin  - STOPPED: none  - CHANGED: none  Medication changes verified by the patient? yes    Medication Accessibility:  [facility] [address] [place], [place]   Was the patient provided with refills on discharged medications? no   Have all prescriptions been transferred from [facility] to home pharmacy? N/A   Is the patient able to afford medications? Patient does have Medicaid, but there is an issue she working through with Medicaid regarding secondary coverage.  Medicaid rejects the claim stating secondary coverage on file. Notable copays: n/a Eligible patient assistance: n/a    Medication Review: ENOXAPARIN - Please verify dosing and anticipated length of therapy. Read discharge note to confirm date of completion and verify this with the patien  Follow-up Appointments:  PCP Hospital f/u appt confirmed? 03/06/21 Scheduled to see Dr.Bass on 03/06/2021.  Next appointment scheduled 04/03/2021.   If their condition worsens, is the pt aware to call PCP or go to the Emergency Dept.? yes  Final Patient Assessment: Patient is concerned she does not have enough medication to last until her next follow up appointment 04/03/21.  Will follow up with Center for [facility] for more refills.

## 2021-03-22 ENCOUNTER — Telehealth: Payer: Self-pay | Admitting: Obstetrics & Gynecology

## 2021-03-22 NOTE — Telephone Encounter (Signed)
Patient had called in, IUD has fallen out and was removed in the ED.  Advised that at her follow up with Dr. Debroah Loop she let him know so that we could potentially replace it.

## 2021-03-24 ENCOUNTER — Telehealth: Payer: Medicaid Other | Admitting: Physician Assistant

## 2021-03-24 ENCOUNTER — Telehealth: Payer: Self-pay

## 2021-03-24 DIAGNOSIS — R102 Pelvic and perineal pain: Secondary | ICD-10-CM

## 2021-03-24 NOTE — Progress Notes (Signed)
Based on what you shared with me, I feel your condition warrants further evaluation and I recommend that you be seen in a face to face visit with your gynecologist or at one of our [facility] clinics. Being that you have had issues with your IUD, are not to far out from your recent delivery, and are already on an antibiotic, I feel you should be evaluated by your OB/GYN.    NOTE: There will be NO CHARGE for this eVisit   If you are having a true medical emergency please call 911.    *Center for [facility]             [address] [place], [place] [address] [phone] (*Take patients with no insurance)  *Center for [employer] at [employer] [address] [place], [place],  [place]  [address] [phone] (*Take patients with no insurance)  Center for [employer] at [employer]                                                             [address], [place], [place], [address] [phone]  Center for [facility] at [facility] [address] [place] [address], [place], [place], [address] [phone]  Center for [facility] at [facility] [address], [place], [place], [address] [phone]  Center for [facility]                                 [address] [place], [place], [place], [address] [phone]  Center for [facility]                                    [address], [place], [place], [address] [phone]  Center for [facility] at [facility] [address], [place], [place], [address]                              [facility] [place] [address], [place], [place], [address] [phone]  Your MyChart E-visit questionnaire answers were reviewed by a board certified advanced clinical practitioner to complete your personal care plan based on your specific  symptoms.  Thank you for using e-Visits.   I provided 7 minutes of non face-to-face time during this encounter for chart review and documentation.

## 2021-03-24 NOTE — Telephone Encounter (Signed)
Patient left VM on nurse line requesting a call back to discuss vaginal bleeding following removal of IUD at urgent care. Would like OCP prescription. Per chart review PP visit is 03/26/21. Patient also called front office and requested a phone call from RN.   Called pt. Pt reports changing a pad 3 times daily for bleeding. States she feels upset that no one told her the IUD could come out. Does not feel like she was educated properly about the IUD. States she asked a lot of questions before getting the IUD but was not prepared. Requests OCPs for birth control. Encouraged pt to do some personal research about birth control pills. Explained that her provider will be happy to discuss options with her at [facility] appt. Pt states her child has a pediatric appt on 03/26/21 at 10 AM. Pt is not sure if she can make PP appt. She states that front office told her it would be October before appointment can be rescheduled. I verified this with patient. Explained that there is one appointment that may be able to switch and will notify pt via MyChart if I am able to change appt.

## 2021-03-26 ENCOUNTER — Encounter: Payer: Self-pay | Admitting: Obstetrics & Gynecology

## 2021-03-26 ENCOUNTER — Other Ambulatory Visit: Payer: Self-pay

## 2021-03-26 ENCOUNTER — Ambulatory Visit (INDEPENDENT_AMBULATORY_CARE_PROVIDER_SITE_OTHER): Payer: Medicaid Other | Admitting: Obstetrics & Gynecology

## 2021-03-26 VITALS — BP 139/91 | HR 89 | Ht 64.0 in | Wt 222.4 lb

## 2021-03-26 DIAGNOSIS — Z98891 History of uterine scar from previous surgery: Secondary | ICD-10-CM

## 2021-03-26 DIAGNOSIS — Z30011 Encounter for initial prescription of contraceptive pills: Secondary | ICD-10-CM

## 2021-03-26 DIAGNOSIS — I2699 Other pulmonary embolism without acute cor pulmonale: Secondary | ICD-10-CM | POA: Diagnosis not present

## 2021-03-26 DIAGNOSIS — R519 Headache, unspecified: Secondary | ICD-10-CM | POA: Diagnosis not present

## 2021-03-26 DIAGNOSIS — Z0000 Encounter for general adult medical examination without abnormal findings: Secondary | ICD-10-CM | POA: Diagnosis not present

## 2021-03-26 MED ORDER — NORETHINDRONE 0.35 MG PO TABS: 1.0000 | ORAL_TABLET | Freq: Every day | ORAL | 11 refills | Status: DC

## 2021-03-26 NOTE — Patient Instructions (Signed)
AREA FAMILY PRACTICE PHYSICIANS  [place] ([address]) [facility] [address] [place], [place] [address] [phone] Mon-Fri 8:30-12:30, 1:30-5:00 Accepting Medicaid [facility] [place] [address], [place], [place] [address] [phone] Mon-Fri 8:00-5:30 [facility] [address] [place], [place] [address] [phone] Mon, Tue, Thur, Fri 8:30-5:00, Wed 10:00-7:00 (closed 1-2pm) Accepting Medicaid [facility] [address], [place], [place]  [address] Phone - [phone]   Fax - [phone]  [place] ([address]) [facility] [address] [place], [place] [address] [phone] Mon-Fri 8:00-5:00 [facility] [place] ([facility])  [address] [place], [place] [address] [phone] Mon-Fri 8:30-5:30, Sat (Oct.-Mar.) 9:00-1:00 Accepting Medicaid  [place] ([address]) [facility] [address], [place], [place] [address] [phone] Mon-Fri 8:00-5:00  [place] ([address]) [facility] [employer] [address], [place], [place] [address] [phone] Mon-Fri 8:00-5:00 [facility] [place] [address], [place], [place] [address] [phone] Mon-Fri 8:00-5:00 [facility] [place] [address] [place], [place] [address] [phone] Mon-Fri 8:00-5:00 [facility] [address] [place] [address] [phone] Mon-Fri 7:30-5:30  [place] ([address]) [facility] [address] [place], [place] [address] [phone] Mon-Thur 8:00-6:00 Accepting Medicaid [facility] [address] [place], [place] [address] [phone] Mon-Thur 7:30-7:30, Fri 7:30-4:30 Accepting  Medicaid [facility] [place] [address], [place], [place]  [address] [phone]   Fax - [phone]  [place] ([address]) [facility] [place] [address] , [place] [address] [phone] Mon-Fri 7:00-5:00 [facility] [address], [place], [place] [address] [phone] Mon-Fri 8:00-5:00 Accepting Medicaid [facility] [address], , [place] [address] [phone] Mon-Fri 8:00-5:00 Accepting Medicaid  [place] ([address]) [facility] [place] [address] [place], [place] [address] [phone] Mon-Fri 8:00-5:00 [facility] [address], [place], [place] [address] [phone] Mon-Fri 8:00-5:00 Accepting Medicaid [facility] ([facility]) [address], [place], [place] [address] [phone] Mon-Fri 8:00-5:30, Sat&Sun by appointment (phones open at 8:30) Accepting Medicaid  [place] ([address]) [facility] [address] [place], [place] [address] [phone] Mon-Thur 8:00-7:00, Fri 8:00-5:00, Sat 8:00-12:00, Sun 9:00-12:00 Accepting Medicaid [facility] [place] [address], [place], [place] [address] [phone] Mon-Thur 8:00-5:00 Accepting Medicaid [facility] [place] [address] [place], [place] [address] [phone] Mon-Fri 8:00-5:30, Sat (Oct.-Mar.) 9:00-1:00 Accepting Medicaid  [place] ([address]) [facility] [address], [place], [place] [address] [phone] Mon-Fri 8:00-5:00 Accepting Medicaid   [place] ([address]) [facility] [place] [address], [place], [place] [address] [phone] Mon-Fri 8:00-5:00 [facility] [place] [address], [place], [place] [address] [phone] Mon-Fri 8:00-5:00 [facility] [place] [address] [place] [address], [place], [place] [address] [phone] Mon-Fri 8:00-5:00 After hours clinic ([address] [place],  [address]) [phone] Mon-Fri 5:00-8:00, Sat 12:00-6:00, Sun 10:00-4:00 Accepting Medicaid [facility] [place]   [address], [place], [place]  [address] [phone]   Fax - [phone]  [place] ([address]) [facility] [place] [address], [place], [place] [address] [phone] Mon-Fri 8:00-5:00 [facility] [place] ([facility] [place]) [address], [place], [place] [address] [phone] Mon-Thur 8:00-7:00, Fri 8:00-5:00, Sat 8:00-12:00    

## 2021-03-26 NOTE — Progress Notes (Signed)
Post Partum Visit Note  Nicole Landry is a 35 y.o. [phone] female who presents for a postpartum visit. She is  4  weeks postpartum following a repeat cesarean section.  I have fully reviewed the prenatal and intrapartum course. The delivery was at 37w gestational weeks.  Anesthesia: spinal. Postpartum course has been . Baby is doing well. Baby is feeding by breast. Bleeding currently bleeding Patients reports since IUD coming out bleedig started back. Bowel function is  reports constipation . Bladder function is normal. Patient is not sexually active. Contraception method is none. Postpartum depression screening: negative. IUD was partially expelled and removed 9/17. She wants OCP, still nursing  The pregnancy intention screening data noted above was reviewed. Potential methods of contraception were discussed. The patient elected to proceed with No data recorded.   [place] Postnatal Depression Scale - 03/26/21 1107       [place] Postnatal Depression Scale:  In the Past 7 Days   I have been able to laugh and see the funny side of things. 0    I have looked forward with enjoyment to things. 0    I have blamed myself unnecessarily when things went wrong. 2    I have been anxious or worried for no good reason. 2    I have felt scared or panicky for no good reason. 2    Things have been getting on top of me. 2    I have been so unhappy that I have had difficulty sleeping. 0    I have felt sad or miserable. 0    I have been so unhappy that I have been crying. 0    The thought of harming myself has occurred to me. 0    [place] Postnatal Depression Scale Total 8             Health Maintenance Due  Topic Date Due   COVID-19 Vaccine (1) Never done   URINE MICROALBUMIN  Never done   PAP SMEAR-Modifier  12/27/2020   INFLUENZA VACCINE  02/03/2021    The following portions of the patient's history were reviewed and updated as appropriate: allergies, current medications, past  family history, past medical history, past social history, past surgical history, and problem list.  Review of Systems Pertinent items are noted in HPI.  Objective:  BP (!) 139/91 Comment: recheck at 123/82  Pulse 89   Ht 5\' 4"  (1.626 m)   Wt 222 lb 6.4 oz (100.9 kg)   Breastfeeding Yes   BMI 38.17 kg/m    General:  alert, cooperative, and no distress   Breasts:  normal  Lungs: Normal effort  Heart:  regular rate and rhythm  Abdomen: soft, non-tender; bowel sounds normal; no masses,  no organomegaly   Wound well approximated incision  GU exam:  not indicated       Assessment:   PE (pulmonary thromboembolism) (HCC) - Plan: Ambulatory referral to Hematology / Oncology, Ambulatory referral to Pulmonology  Frequent headaches - Plan: Ambulatory referral to Neurology, norethindrone (MICRONOR) 0.35 MG tablet  Health care maintenance - Plan: Ambulatory referral to Internal Medicine  Status post cesarean section routine postpartum follow-up  Oral contraception initial prescription   normal postpartum exam. S/P PE on lovenox for 3 months, will refer to hematology and to pulmonary  Plan:   Essential components of care per ACOG recommendations:  1.  Mood and well being: Patient with negative depression screening today. Reviewed local resources for support.  - Patient  tobacco use? No.   - hx of drug use? No.    2. Infant care and feeding:  -Patient currently breastmilk feeding? Yes. Discussed returning to work and pumping.  -Social determinants of health (SDOH) reviewed in EPIC. No concerns  3. Sexuality, contraception and birth spacing - Patient does not want a pregnancy in the next year.  Desired family size is 5 children.  - Reviewed forms of contraception in tiered fashion. Patient desired oral progesterone-only contraceptive today.   - Discussed birth spacing of 18 months  4. Sleep and fatigue -Encouraged family/partner/community support of 4 hrs of uninterrupted sleep  to help with mood and fatigue  5. Physical Recovery  - Discussed patients delivery and complications. She describes her labor as good. - Patient had a  pulmonary embolism . - Patient has urinary incontinence? No. - Patient is safe to resume physical and sexual activity  6.  Health Maintenance - HM due items addressed Yes - Last pap smear No results found for: DIAGPAP Pap smear not done at today's visit. Last pap 12/27/2017 negative -Breast Cancer screening indicated? No.   7. Chronic Disease/Pregnancy Condition follow up:  Pulmonary and hematology  - PCP follow up  Scheryl Darter, MD Center for [facility], [facility]

## 2021-03-31 ENCOUNTER — Telehealth: Payer: Self-pay | Admitting: *Deleted

## 2021-03-31 ENCOUNTER — Inpatient Hospital Stay: Payer: Medicaid Other | Admitting: Hematology and Oncology

## 2021-03-31 ENCOUNTER — Inpatient Hospital Stay: Payer: Medicaid Other

## 2021-03-31 NOTE — Telephone Encounter (Signed)
Called pt to f/u with missing appt this am. Pt stated she called to reschedule and was directed to call back later due to having sick child. Message was sent to scheduling to call and reschedule

## 2021-03-31 NOTE — Progress Notes (Deleted)
[facility] CONSULT NOTE  Patient Care Team: Pcp, No as PCP - General Patient, No Pcp Per (Inactive) (General Practice)  CHIEF COMPLAINTS/PURPOSE OF CONSULTATION:  PE, initial consultation  ASSESSMENT & PLAN:  No problem-specific Assessment & Plan notes found for this encounter.  No orders of the defined types were placed in this encounter.    HISTORY OF PRESENTING ILLNESS:  Nicole Landry 35 y.o. female is here because of PE  REVIEW OF SYSTEMS:   Constitutional: Denies fevers, chills or abnormal night sweats Eyes: Denies blurriness of vision, double vision or watery eyes Ears, nose, mouth, throat, and face: Denies mucositis or sore throat Respiratory: Denies cough, dyspnea or wheezes Cardiovascular: Denies palpitation, chest discomfort or lower extremity swelling Gastrointestinal:  Denies nausea, heartburn or change in bowel habits Skin: Denies abnormal skin rashes Lymphatics: Denies new lymphadenopathy or easy bruising Neurological:Denies numbness, tingling or new weaknesses Behavioral/Psych: Mood is stable, no new changes  All other systems were reviewed with the patient and are negative.  MEDICAL HISTORY:  Past Medical History:  Diagnosis Date   Blood transfusion without reported diagnosis    after ectopic   Ectopic pregnancy, tubal    Gestational diabetes mellitus 2011   denies gestational DM with current pregnancy   Ovarian cyst    Status post vacuum-assisted vaginal delivery (5/17) 11/20/2015    SURGICAL HISTORY: Past Surgical History:  Procedure Laterality Date   BILATERAL SALPINGECTOMY Right 02/08/2013   Procedure:  SALPINGECTOMY;  Surgeon: Antionette Char, MD;  Location: [facility];  Service: Gynecology;  Laterality: Right;   CESAREAN SECTION     CESAREAN SECTION N/A 08/18/2017   Procedure: Repeat CESAREAN SECTION;  Surgeon: Shea Evans, MD;  Location: [facility]IRTHING SU[facility];  Service: Obstetrics;  Laterality: N/A;  EDD: 08/24/17 Allergy:  Vicodin   CESAREAN SECTION N/A 04/12/2019   Procedure: Repeat CESAREAN SECTION;  Surgeon: Shea Evans, MD;  Location: [facility];  Service: Obstetrics;  Laterality: N/A;  EDD: 04/17/19 Allergy: Vicodin   CESAREAN SECTION N/A 02/21/2021   Procedure: CESAREAN SECTION;  Surgeon: Myna Hidalgo, DO;  Location: [facility];  Service: Obstetrics;  Laterality: N/A;   DILATION AND CURETTAGE OF UTERUS     LAPAROSCOPY N/A 02/08/2013   Procedure: LAPAROSCOPY OPERATIVE;  Surgeon: Antionette Char, MD;  Location: [facility];  Service: Gynecology;  Laterality: N/A;   SALPINGECTOMY Right 02/2013   op note in epic    SOCIAL HISTORY: Social History   Socioeconomic History   Marital status: Married    Spouse name: Not on file   Number of children: Not on file   Years of education: Not on file   Highest education level: Not on file  Occupational History   Not on file  Tobacco Use   Smoking status: Never   Smokeless tobacco: Never  Vaping Use   Vaping Use: Never used  Substance and Sexual Activity   Alcohol use: No   Drug use: No   Sexual activity: Not Currently    Birth control/protection: None  Other Topics Concern   Not on file  Social History Narrative   ** Merged History Encounter **       Social Determinants of Health   Financial Resource Strain: Not on file  Food Insecurity: No Food Insecurity   Worried About Programme researcher, broadcasting/film/video in the Last Year: Never true   Ran Out of Food in the Last Year: Never true  Transportation Needs: No Transportation Needs   Lack of Transportation (  Medical): No   Lack of Transportation (Non-Medical): No  Physical Activity: Not on file  Stress: Not on file  Social Connections: Not on file  Intimate Partner Violence: Not on file    FAMILY HISTORY: Family History  Problem Relation Age of Onset   Asthma Neg Hx    Depression Neg Hx    Diabetes Neg Hx    Heart disease Neg Hx    Hypertension Neg Hx    Obesity Neg Hx     ALLERGIES:  is allergic to  food and vicodin [hydrocodone-acetaminophen].  MEDICATIONS:  Current Outpatient Medications  Medication Sig Dispense Refill   acetaminophen (TYLENOL) 500 MG tablet Take 2 tablets (1,000 mg total) by mouth every 8 (eight) hours as needed (pain). (Patient not taking: No sig reported) 60 tablet 0   enoxaparin (LOVENOX) 100 MG/ML injection Inject 1 mL (100 mg total) into the skin every 12 (twelve) hours. 180 mL 0   ferrous sulfate 325 (65 FE) MG tablet Take 1 tablet (325 mg total) by mouth every other day. (Patient not taking: No sig reported) 60 tablet 2   labetalol (NORMODYNE) 200 MG tablet Take 1 tablet (200 mg total) by mouth 2 (two) times daily. 60 tablet 1   norethindrone (MICRONOR) 0.35 MG tablet Take 1 tablet (0.35 mg total) by mouth daily. 28 tablet 11   oxyCODONE (OXY IR/ROXICODONE) 5 MG immediate release tablet Take 1-2 tablets (5-10 mg total) by mouth every 4 (four) hours as needed for moderate pain or severe pain. (Patient not taking: No sig reported) 30 tablet 0   Prenatal Vit-Fe Fumarate-FA (PRENATAL MULTIVITAMIN) TABS tablet Take 1 tablet by mouth daily at 12 noon.     No current facility-administered medications for this visit.     PHYSICAL EXAMINATION: ECOG PERFORMANCE STATUS: {CHL ONC ECOG PS:[id number]}  There were no vitals filed for this visit. There were no vitals filed for this visit.  GENERAL:alert, no distress and comfortable SKIN: skin color, texture, turgor are normal, no rashes or significant lesions EYES: normal, conjunctiva are pink and non-injected, sclera clear OROPHARYNX:no exudate, no erythema and lips, buccal mucosa, and tongue normal  NECK: supple, thyroid normal size, non-tender, without nodularity LYMPH:  no palpable lymphadenopathy in the cervical, axillary or inguinal LUNGS: clear to auscultation and percussion with normal breathing effort HEART: regular rate & rhythm and no murmurs and no lower extremity edema ABDOMEN:abdomen soft, non-tender and  normal bowel sounds Musculoskeletal:no cyanosis of digits and no clubbing  PSYCH: alert & oriented x 3 with fluent speech NEURO: no focal motor/sensory deficits  LABORATORY DATA:  I have reviewed the data as listed Lab Results  Component Value Date   WBC 9.1 02/26/2021   HGB 8.9 (L) 02/26/2021   HCT 29.4 (L) 02/26/2021   MCV 83.5 02/26/2021   PLT 396 02/26/2021     Chemistry      Component Value Date/Time   NA 140 02/26/2021 0502   NA 137 10/22/2020 1134   K 3.3 (L) 02/26/2021 0502   CL 104 02/26/2021 0502   CO2 26 02/26/2021 0502   BUN <5 (L) 02/26/2021 0502   BUN 5 (L) 10/22/2020 1134   CREATININE 0.50 02/26/2021 0502      Component Value Date/Time   CALCIUM 8.9 02/26/2021 0502   ALKPHOS 74 02/26/2021 0502   AST 42 (H) 02/26/2021 0502   ALT 36 02/26/2021 0502   BILITOT 0.5 02/26/2021 0502   BILITOT <0.2 10/22/2020 1134     02/26/2021  PE  suspected, post partum  IMPRESSION: 1. Segmental and subsegmental pulmonary emboli in the left upper, right middle, and bilateral lower lobes. No CT findings of right heart strain.   2.  Lungs are clear.   These results were called by telephone at the time of interpretation on 02/26/2021 at 8:41 am and 9:05 am to provider Lexine Baton, who verbally acknowledged these results.     Electronically Signed   By: Sherron Ales M.D.   On: 02/26/2021 09:08  RADIOGRAPHIC STUDIES: I have personally reviewed the radiological images as listed and agreed with the findings in the report. No results found.  All questions were answered. The patient knows to call the clinic with any problems, questions or concerns. I spent *** minutes in the care of this patient including H and P, review of records, counseling and coordination of care.     Rachel Moulds, MD 03/31/2021 9:18 AM

## 2021-04-03 ENCOUNTER — Other Ambulatory Visit: Payer: Medicaid Other

## 2021-04-03 ENCOUNTER — Inpatient Hospital Stay: Payer: Medicaid Other | Admitting: Hematology and Oncology

## 2021-04-03 ENCOUNTER — Encounter: Payer: Medicaid Other | Admitting: Hematology and Oncology

## 2021-04-03 NOTE — Progress Notes (Deleted)
[facility] CONSULT NOTE  Patient Care Team: Pcp, No as PCP - General Patient, No Pcp Per (Inactive) (General Practice)  CHIEF COMPLAINTS/PURPOSE OF CONSULTATION:  ***  ASSESSMENT & PLAN:  No problem-specific Assessment & Plan notes found for this encounter.  No orders of the defined types were placed in this encounter.    HISTORY OF PRESENTING ILLNESS:  Nicole Landry 35 y.o. female is here because of ***  REVIEW OF SYSTEMS:   Constitutional: Denies fevers, chills or abnormal night sweats Eyes: Denies blurriness of vision, double vision or watery eyes Ears, nose, mouth, throat, and face: Denies mucositis or sore throat Respiratory: Denies cough, dyspnea or wheezes Cardiovascular: Denies palpitation, chest discomfort or lower extremity swelling Gastrointestinal:  Denies nausea, heartburn or change in bowel habits Skin: Denies abnormal skin rashes Lymphatics: Denies new lymphadenopathy or easy bruising Neurological:Denies numbness, tingling or new weaknesses Behavioral/Psych: Mood is stable, no new changes  All other systems were reviewed with the patient and are negative.  MEDICAL HISTORY:  Past Medical History:  Diagnosis Date   Blood transfusion without reported diagnosis    after ectopic   Ectopic pregnancy, tubal    Gestational diabetes mellitus 2011   denies gestational DM with current pregnancy   Ovarian cyst    Status post vacuum-assisted vaginal delivery (5/17) 11/20/2015    SURGICAL HISTORY: Past Surgical History:  Procedure Laterality Date   BILATERAL SALPINGECTOMY Right 02/08/2013   Procedure:  SALPINGECTOMY;  Surgeon: Antionette Char, MD;  Location: [facility];  Service: Gynecology;  Laterality: Right;   CESAREAN SECTION     CESAREAN SECTION N/A 08/18/2017   Procedure: Repeat CESAREAN SECTION;  Surgeon: Shea Evans, MD;  Location: [facility]IRTH[facility] SU[facility];  Service: Obstetrics;  Laterality: N/A;  EDD: 08/24/17 Allergy: Vicodin   CESAREAN  SECTION N/A 04/12/2019   Procedure: Repeat CESAREAN SECTION;  Surgeon: Shea Evans, MD;  Location: [facility];  Service: Obstetrics;  Laterality: N/A;  EDD: 04/17/19 Allergy: Vicodin   CESAREAN SECTION N/A 02/21/2021   Procedure: CESAREAN SECTION;  Surgeon: Myna Hidalgo, DO;  Location: [facility];  Service: Obstetrics;  Laterality: N/A;   DILATION AND CURETTAGE OF UTERUS     LAPAROSCOPY N/A 02/08/2013   Procedure: LAPAROSCOPY OPERATIVE;  Surgeon: Antionette Char, MD;  Location: [facility];  Service: Gynecology;  Laterality: N/A;   SALPINGECTOMY Right 02/2013   op note in epic    SOCIAL HISTORY: Social History   Socioeconomic History   Marital status: Married    Spouse name: Not on file   Number of children: Not on file   Years of education: Not on file   Highest education level: Not on file  Occupational History   Not on file  Tobacco Use   Smoking status: Never   Smokeless tobacco: Never  Vaping Use   Vaping Use: Never used  Substance and Sexual Activity   Alcohol use: No   Drug use: No   Sexual activity: Not Currently    Birth control/protection: None  Other Topics Concern   Not on file  Social History Narrative   ** Merged History Encounter **       Social Determinants of Health   Financial Resource Strain: Not on file  Food Insecurity: No Food Insecurity   Worried About Programme researcher, broadcasting/film/video in the Last Year: Never true   Ran Out of Food in the Last Year: Never true  Transportation Needs: No Transportation Needs   Lack of Transportation (Medical): No  Lack of Transportation (Non-Medical): No  Physical Activity: Not on file  Stress: Not on file  Social Connections: Not on file  Intimate Partner Violence: Not on file    FAMILY HISTORY: Family History  Problem Relation Age of Onset   Asthma Neg Hx    Depression Neg Hx    Diabetes Neg Hx    Heart disease Neg Hx    Hypertension Neg Hx    Obesity Neg Hx     ALLERGIES:  is allergic to food and vicodin  [hydrocodone-acetaminophen].  MEDICATIONS:  Current Outpatient Medications  Medication Sig Dispense Refill   acetaminophen (TYLENOL) 500 MG tablet Take 2 tablets (1,000 mg total) by mouth every 8 (eight) hours as needed (pain). (Patient not taking: No sig reported) 60 tablet 0   enoxaparin (LOVENOX) 100 MG/ML injection Inject 1 mL (100 mg total) into the skin every 12 (twelve) hours. 180 mL 0   ferrous sulfate 325 (65 FE) MG tablet Take 1 tablet (325 mg total) by mouth every other day. (Patient not taking: No sig reported) 60 tablet 2   labetalol (NORMODYNE) 200 MG tablet Take 1 tablet (200 mg total) by mouth 2 (two) times daily. 60 tablet 1   norethindrone (MICRONOR) 0.35 MG tablet Take 1 tablet (0.35 mg total) by mouth daily. 28 tablet 11   oxyCODONE (OXY IR/ROXICODONE) 5 MG immediate release tablet Take 1-2 tablets (5-10 mg total) by mouth every 4 (four) hours as needed for moderate pain or severe pain. (Patient not taking: No sig reported) 30 tablet 0   Prenatal Vit-Fe Fumarate-FA (PRENATAL MULTIVITAMIN) TABS tablet Take 1 tablet by mouth daily at 12 noon.     No current facility-administered medications for this visit.     PHYSICAL EXAMINATION: ECOG PERFORMANCE STATUS: {CHL ONC ECOG PS:[id number]}  There were no vitals filed for this visit. There were no vitals filed for this visit.  GENERAL:alert, no distress and comfortable SKIN: skin color, texture, turgor are normal, no rashes or significant lesions EYES: normal, conjunctiva are pink and non-injected, sclera clear OROPHARYNX:no exudate, no erythema and lips, buccal mucosa, and tongue normal  NECK: supple, thyroid normal size, non-tender, without nodularity LYMPH:  no palpable lymphadenopathy in the cervical, axillary or inguinal LUNGS: clear to auscultation and percussion with normal breathing effort HEART: regular rate & rhythm and no murmurs and no lower extremity edema ABDOMEN:abdomen soft, non-tender and normal bowel  sounds Musculoskeletal:no cyanosis of digits and no clubbing  PSYCH: alert & oriented x 3 with fluent speech NEURO: no focal motor/sensory deficits  LABORATORY DATA:  I have reviewed the data as listed Lab Results  Component Value Date   WBC 9.1 02/26/2021   HGB 8.9 (L) 02/26/2021   HCT 29.4 (L) 02/26/2021   MCV 83.5 02/26/2021   PLT 396 02/26/2021     Chemistry      Component Value Date/Time   NA 140 02/26/2021 0502   NA 137 10/22/2020 1134   K 3.3 (L) 02/26/2021 0502   CL 104 02/26/2021 0502   CO2 26 02/26/2021 0502   BUN <5 (L) 02/26/2021 0502   BUN 5 (L) 10/22/2020 1134   CREATININE 0.50 02/26/2021 0502      Component Value Date/Time   CALCIUM 8.9 02/26/2021 0502   ALKPHOS 74 02/26/2021 0502   AST 42 (H) 02/26/2021 0502   ALT 36 02/26/2021 0502   BILITOT 0.5 02/26/2021 0502   BILITOT <0.2 10/22/2020 1134       RADIOGRAPHIC STUDIES: I have personally  reviewed the radiological images as listed and agreed with the findings in the report. No results found.  All questions were answered. The patient knows to call the clinic with any problems, questions or concerns. I spent *** minutes in the care of this patient including H and P, review of records, counseling and coordination of care.     Rachel Moulds, MD 04/03/2021 9:20 AM

## 2021-04-10 ENCOUNTER — Inpatient Hospital Stay: Payer: Medicaid Other | Attending: Hematology and Oncology | Admitting: Hematology and Oncology

## 2021-04-24 ENCOUNTER — Other Ambulatory Visit: Payer: Self-pay

## 2021-04-24 ENCOUNTER — Ambulatory Visit: Payer: Medicaid Other | Admitting: Neurology

## 2021-04-24 ENCOUNTER — Encounter: Payer: Self-pay | Admitting: Neurology

## 2021-04-24 VITALS — BP 134/89 | HR 60 | Ht 64.0 in | Wt 221.8 lb

## 2021-04-24 DIAGNOSIS — R29898 Other symptoms and signs involving the musculoskeletal system: Secondary | ICD-10-CM

## 2021-04-24 DIAGNOSIS — R200 Anesthesia of skin: Secondary | ICD-10-CM

## 2021-04-24 DIAGNOSIS — H5462 Unqualified visual loss, left eye, normal vision right eye: Secondary | ICD-10-CM

## 2021-04-24 DIAGNOSIS — R51 Headache with orthostatic component, not elsewhere classified: Secondary | ICD-10-CM

## 2021-04-24 DIAGNOSIS — G08 Intracranial and intraspinal phlebitis and thrombophlebitis: Secondary | ICD-10-CM | POA: Diagnosis not present

## 2021-04-24 DIAGNOSIS — I6389 Other cerebral infarction: Secondary | ICD-10-CM

## 2021-04-24 DIAGNOSIS — R531 Weakness: Secondary | ICD-10-CM | POA: Diagnosis not present

## 2021-04-24 DIAGNOSIS — R202 Paresthesia of skin: Secondary | ICD-10-CM

## 2021-04-24 NOTE — Patient Instructions (Addendum)
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 ALPRAZOLAM (al PRAY zoe lam) treats anxiety. It works by Education administrator system calm down. It belongs to a group of medications called benzodiazepines. This medicine may be used for other purposes; ask your health care provider or pharmacist if you have questions. COMMON BRAND NAME(S): Xanax What should I tell my care team before I take this medication? They need to know if you have any of these conditions: Depression or other mental health disease History of alcohol or drug abuse or addiction Kidney disease Liver disease Lung disease, asthma, or breathing problem Seizures Suicidal thoughts, plans or attempt An unusual or allergic reaction to alprazolam, other benzodiazepines, foods, dyes, or preservatives Pregnant or trying to get pregnant Breast-feeding How should I use this medication? Take this medication by mouth. Take it as directed on the prescription label. Do not take it more often than directed. Keep taking it unless your care team tells you to stop. A special MedGuide will be given to you by the pharmacist with each prescription and refill. Be sure to read this information carefully each time. Talk to your care team about the use of this medication in children. Special care may be needed. Patients over 42 years of age may have a stronger reaction and need a smaller dose. Overdosage: If you think you have taken too much of this medicine contact a poison control center or emergency room at once. NOTE: This medicine is only for you. Do not share this medicine with others. What if I miss a dose? If you miss a dose, take it as soon as you can. If it is almost time for your next dose, take only that dose. Do not take double or extra doses. What may interact with this medication? Do not take this medication  with any of the following: Certain antivirals for HIV or hepatitis Certain medications for fungal infections like ketoconazole, itraconazole, or posaconazole Clarithromycin Grapefruit juice Narcotic medications for cough Sodium oxybate This medication may also interact with the following: Alcohol Antihistamines for allergy, cough and cold Certain medications for anxiety or sleep Certain medications for depression like amitriptyline, fluoxetine, fluvoxamine, nefazodone, sertraline Certain medications for seizures like carbamazepine, phenobarbital, phenytoin, primidone Cimetidine Digoxin Erythromycin Female hormones, like estrogens or progestins and birth control pills, patches, rings, or injections General anesthetics like halothane, isoflurane, methoxyflurane, propofol Medications that relax muscles Narcotic medications for pain Phenothiazines like chlorpromazine, mesoridazine, prochlorperazine, thioridazine This list may not describe all possible interactions. Give your health care provider a list of all the medicines, herbs, non-prescription drugs, or dietary supplements you use. Also tell them if you smoke, drink alcohol, or use illegal drugs. Some items may interact with your medicine. What should I watch for while using this medication? Visit your care team for regular checks on your progress. Tell your care team if your symptoms do not start to get better or if they get worse. Do not stop taking except on your care team's advice. You may develop a severe reaction. Your care team will tell you how much medication to take. You may get drowsy or dizzy. Do not drive, use machinery, or do anything that needs mental alertness until you know how this medication affects you. To reduce the risk of dizzy and fainting spells, do not stand or sit up quickly, especially if you are an older patient. Alcohol may increase dizziness and  drowsiness. Avoid alcoholic drinks. If you are taking another  medication that also causes drowsiness, you may have more side effects. Give your care team a list of all medications you use. Your care team will tell you how much medication to take. Do not take more medication than directed. Call emergency services if you have problems breathing or unusual sleepiness. Women should inform their care team if they wish to become pregnant or think they might be pregnant. Do not breast-feed while taking this medication. Talk to your care team for more information. What side effects may I notice from receiving this medication? Side effects that you should report to your care team as soon as possible: Allergic reactions-skin rash, itching, hives, swelling of the face, lips, tongue, or throat CNS depression-slow or shallow breathing, shortness of breath, feeling faint, dizziness, confusion, trouble staying awake Thoughts of suicide or self-harm, worsening mood, feelings of depression Side effects that usually do not require medical attention (report to your care team if they continue or are bothersome): Change in sex drive or performance Dizziness Drowsiness Nausea This list may not describe all possible side effects. Call your doctor for medical advice about side effects. You may report side effects to FDA at 1-800-FDA-1088. Where should I keep my medication? Keep out of the reach of children and pets. This medication can be abused. Keep it in a safe place to protect it from theft. Do not share it with anyone. It is only for you. Selling or giving away this medication is dangerous and against the law. Store at room temperature between 20 and 25 degrees C (68 and 77 degrees F). Get rid of any unused medication after the expiration date. This medication may cause harm and death if it is taken by other adults, children, or pets. It is important to get rid of the medication as soon as you no longer need it, or it is expired. You can do this in two ways: Take the medication  to a medication take-back program. Check with your pharmacy or law enforcement to find a location. If you cannot return the medication, check the label or package insert to see if the medication should be thrown out in the garbage or flushed down the toilet. If you are not sure, ask your care team. If it is safe to put it in the trash, take the medication out of the container. Mix the medication with cat litter, dirt, coffee grounds, or other unwanted substance. Seal the mixture in a bag or container. Put it in the trash. NOTE: This sheet is a summary. It may not cover all possible information. If you have questions about this medicine, talk to your doctor, pharmacist, or health care provider.  2022 Elsevier/Gold Standard (2020-07-19 13:07:37)

## 2021-04-24 NOTE — Progress Notes (Signed)
[facility]    Provider:  Dr Lucia Gaskins Requesting Provider: Adam Phenix, MD Primary Care Provider:  Pcp, No  CC:  left-sided numbness and weakness in the setting of pulmonary embolus  HPI:  Nicole Landry is a 35 y.o. female here as requested by Adam Phenix, MD for left-sided numbness and tingling.  She has a past medical history of obstructive pulmonary embolism, pulmonary thromboembolism, postpartum hypertension, gestational diabetes, obesity.  Patient is here for acute onset left-sided weakness and numbness in the setting of pulmonary embolism.  I reviewed her hospitalization records: Patient was admitted on February 26, 2021 and discharged on February 27, 2021 with acute onset shortness of breath and palpitations found to have extensive pulmonary emboli.  She was started on therapeutic Lovenox for 3 months and her hypertension was treated with labetalol.  She was discharged on 02/27/2021.  Patient comes in today due to acute onset left-sided symptoms a week after discharge approximately the beginning of September.  She woke up confused August 24, she heard a heart beat in the ear, she was not responding correctly, her heart was beating hard, she was diagnosed with pulmonary embolism and started on Lovenox and discharged in August 25.  A week after starting lovenox her lips were numb, her BP was elevated, left arm and left hand fingertops, numbness and tingling and weakness. Problems holding daughter with weakness, just left hand and arm nothing on the right side. Lips completely numb, never had anything like this before, The left arm still weak, more proximal muscles, opening things as well. Grasp is not as strong, she had to eat with her right side when it all started. She had blurry vision, headaches, more in the temples in the front of the head, pusating/pounding, light sensitivity, no nausea, never had migraines befores and was very different than prior headaches, they  were every day, now she gets headaches 3x a week which is better, she doesn't take medication, they last for a whole day, no family history of migraines, in the past she would get headaches around her menses, she could take something in the past now nothing works. No FHx of autoimmune disorders. Acute onset. No other focal neurologic deficits, associated symptoms, inciting events or modifiable factors.  Reviewed notes, labs and imaging from outside physicians, which showed:  02/20/2021: BUN 5, creat 0.5  CT Angio Chest (02/26/2021): reviewed report IMPRESSION: 1. Segmental and subsegmental pulmonary emboli in the left upper, right middle, and bilateral lower lobes. No CT findings of right heart strain.   2.  Lungs are clear.   These results were called by telephone at the time of interpretation on 02/26/2021 at 8:41 am and 9:05 am to provider Lexine Baton, who verbally acknowledged these results.    CT 06/23/2019: showed No acute intracranial abnormalities including mass lesion or mass effect, hydrocephalus, extra-axial fluid collection, midline shift, hemorrhage, or acute infarction, large ischemic events (personally reviewed images)    Review of Systems: Patient complains of symptoms per HPI as well as the following symptoms numbness and weakness. Pertinent negatives and positives per HPI. All others negative.   Social History   Socioeconomic History   Marital status: Married    Spouse name: Not on file   Number of children: Not on file   Years of education: Not on file   Highest education level: Not on file  Occupational History   Not on file  Tobacco Use   Smoking status: Never   Smokeless tobacco:  Never  Vaping Use   Vaping Use: Never used  Substance and Sexual Activity   Alcohol use: No   Drug use: No   Sexual activity: Not Currently    Birth control/protection: None  Other Topics Concern   Not on file  Social History Narrative   ** Merged History Encounter **        Social Determinants of Health   Financial Resource Strain: Not on file  Food Insecurity: No Food Insecurity   Worried About Programme researcher, broadcasting/film/video in the Last Year: Never true   Ran Out of Food in the Last Year: Never true  Transportation Needs: No Transportation Needs   Lack of Transportation (Medical): No   Lack of Transportation (Non-Medical): No  Physical Activity: Not on file  Stress: Not on file  Social Connections: Not on file  Intimate Partner Violence: Not on file    Family History  Problem Relation Age of Onset   Asthma Neg Hx    Depression Neg Hx    Diabetes Neg Hx    Heart disease Neg Hx    Hypertension Neg Hx    Obesity Neg Hx    Miscarriages / Stillbirths Neg Hx     Past Medical History:  Diagnosis Date   Blood transfusion without reported diagnosis    after ectopic   Ectopic pregnancy, tubal    Gestational diabetes mellitus 2011   denies gestational DM with current pregnancy   Ovarian cyst    Status post vacuum-assisted vaginal delivery (5/17) 11/20/2015    Patient Active Problem List   Diagnosis Date Noted   Encounter for postpartum visit 03/06/2021   Carpal tunnel syndrome of left wrist 03/06/2021   Neuralgia 03/06/2021   Visit for wound check 03/06/2021   PE (pulmonary thromboembolism) (HCC) 02/26/2021   Postpartum hypertension 02/26/2021   Chest pain 02/26/2021   Shortness of breath 02/26/2021   Obstetric pulmonary embolism, postpartum 02/26/2021   Gestational diabetes mellitus (GDM) in third trimester 12/23/2020   Obesity in pregnancy, antepartum 10/22/2020   Abnormal findings on diagnostic imaging of other specified body structures 08/13/2014    Past Surgical History:  Procedure Laterality Date   BILATERAL SALPINGECTOMY Right 02/08/2013   Procedure:  SALPINGECTOMY;  Surgeon: Antionette Char, MD;  Location: [facility];  Service: Gynecology;  Laterality: Right;   CESAREAN SECTION     CESAREAN SECTION N/A 08/18/2017   Procedure: Repeat  CESAREAN SECTION;  Surgeon: Shea Evans, MD;  Location: [facility];  Service: Obstetrics;  Laterality: N/A;  EDD: 08/24/17 Allergy: Vicodin   CESAREAN SECTION N/A 04/12/2019   Procedure: Repeat CESAREAN SECTION;  Surgeon: Shea Evans, MD;  Location: [facility];  Service: Obstetrics;  Laterality: N/A;  EDD: 04/17/19 Allergy: Vicodin   CESAREAN SECTION N/A 02/21/2021   Procedure: CESAREAN SECTION;  Surgeon: Myna Hidalgo, DO;  Location: [facility];  Service: Obstetrics;  Laterality: N/A;   DILATION AND CURETTAGE OF UTERUS     LAPAROSCOPY N/A 02/08/2013   Procedure: LAPAROSCOPY OPERATIVE;  Surgeon: Antionette Char, MD;  Location: [facility];  Service: Gynecology;  Laterality: N/A;   SALPINGECTOMY Right 02/2013   op note in epic    Current Outpatient Medications  Medication Sig Dispense Refill   ALPRAZolam (XANAX) 0.25 MG tablet Take 1-2 tabs (0.25mg -0.50mg ) 30-60 minutes before procedure. May repeat if needed.Do not drive.May cause sedation 6 tablet 0   enoxaparin (LOVENOX) 100 MG/ML injection Inject 1 mL (100 mg total) into the skin every 12 (twelve) hours.  180 mL 0   Prenatal Vit-Fe Fumarate-FA (PRENATAL MULTIVITAMIN) TABS tablet Take 1 tablet by mouth daily at 12 noon.     No current facility-administered medications for this visit.    Allergies as of 04/24/2021 - Review Complete 04/24/2021  Allergen Reaction Noted   Food Anaphylaxis and Other (See Comments) 12/19/2016   Vicodin [hydrocodone-acetaminophen] Other (See Comments) 02/01/2013    Vitals: BP 134/89   Pulse 60   Ht 5\' 4"  (1.626 m)   Wt 221 lb 12.8 oz (100.6 kg)   Breastfeeding Yes   BMI 38.07 kg/m  Last Weight:  Wt Readings from Last 1 Encounters:  04/24/21 221 lb 12.8 oz (100.6 kg)   Last Height:   Ht Readings from Last 1 Encounters:  04/24/21 5\' 4"  (1.626 m)     Physical exam: Exam: Gen: NAD, conversant, well nourised, obese, well groomed                     CV: RRR, no MRG. No Carotid Bruits. No  peripheral edema, warm, nontender Eyes: Conjunctivae clear without exudates or hemorrhage  Neuro: Detailed Neurologic Exam  Speech:    Speech is normal; fluent and spontaneous with normal comprehension.  Cognition:    The patient is oriented to person, place, and time;     recent and remote memory intact;     language fluent;     normal attention, concentration,     fund of knowledge Cranial Nerves:    The pupils are equal, round, and reactive to light. The fundi are flat. Visual fields are full to finger confrontation right, left impaired peripherally and centrally. Extraocular movements are intact. Trigeminal sensation is decreased left face. The face is symmetric. The palate elevates in the midline. Hearing intact. Voice is normal. Shoulder shrug is normal. The tongue has normal motion without fasciculations.   Coordination:    Normal finger to nose   Gait:    Heel-toe and tandem gait are normal.   Motor Observation:    No asymmetry, no atrophy, and no involuntary movements noted. Tone:    Normal muscle tone.    Posture:    Posture is normal. normal erect    Strength: left arm weakness throughout proximally 3/5 > distally 4/5 otherwise strength is V/V in the upper and lower limbs.      Sensation: decreased left arm     Reflex Exam:  DTR's:    Deep tendon reflexes in the upper and lower extremities are normal bilaterally.   Toes:    The toes are downgoing bilaterally.   Clonus:    Clonus is absent.    Assessment/Plan:  35 y.o. female here as requested by , MD for left-sided numbness and tingling.  She has a past medical history of obstructive pulmonary embolism, pulmonary thromboembolism, postpartum hypertension, gestational diabetes, obesity.  Patient is here for acute onset left-sided weakness and numbness in the setting of pulmonary embolism.  I reviewed her hospitalization records: Patient was admitted on February 26, 2021 and discharged on February 27, 2021 with acute onset shortness of breath and palpitations found to have extensive pulmonary emboli.  She was started on therapeutic Lovenox for 3 months and her hypertension was treated with labetalol.  She was discharged on 02/27/2021.  Patient comes in today due to acute onset left-sided symptoms a week after discharge approximately the beginning of September.  Given acute onset left-sided numbness and weakness in the setting of pulmonary thromboembolism suspect cerebral  venous thromboembolism with stroke.  Patient needs an MRI of the brain as well as an MRV.  Less likely but cannot rule out etiologies such as multiple sclerosis that can present postpartum given left eye vision loss, left-sided numbness and weakness, we will also order an MRI of the cervical spine with and without contrast.Will also send to ophthalmology for left eye vision loss on exam. Also physical therapy. Xanax for MRIs. Continue Lovenox.   Physical therapy: [facility]: Left eye vision loss, visual fields, evaluate and treat   Orders Placed This Encounter  Procedures   MR BRAIN W WO CONTRAST   MR MRV HEAD WO CM   MR CERVICAL SPINE W WO CONTRAST   Ambulatory referral to Physical Therapy   Ambulatory referral to Ophthalmology    Meds ordered this encounter  Medications   ALPRAZolam (XANAX) 0.25 MG tablet    Sig: Take 1-2 tabs (0.25mg -0.50mg ) 30-60 minutes before procedure. May repeat if needed.Do not drive.May cause sedation    Dispense:  6 tablet    Refill:  0     Cc: Adam Phenix, MD,  Pcp, No  Naomie Dean, MD  [facility] [address] [place], [place] [address]  Phone [phone] Fax [phone]

## 2021-04-27 ENCOUNTER — Encounter: Payer: Self-pay | Admitting: Neurology

## 2021-04-27 MED ORDER — ALPRAZOLAM 0.25 MG PO TABS: ORAL_TABLET | ORAL | 0 refills | Status: DC

## 2021-04-29 ENCOUNTER — Telehealth: Payer: Self-pay | Admitting: Neurology

## 2021-04-29 NOTE — Telephone Encounter (Signed)
[facility]: [phone], [phone] & [phone] (exp. 04/29/21 to 06/13/21)   Order sent to GI, they will reach out to the patient to schedule.

## 2021-04-29 NOTE — Telephone Encounter (Signed)
Sent to [facility] ph # [phone] 

## 2021-05-13 ENCOUNTER — Ambulatory Visit
Admission: RE | Admit: 2021-05-13 | Discharge: 2021-05-13 | Disposition: A | Discharge status: Home / Self Care | Payer: Medicaid Other | Source: Ambulatory Visit | Attending: Neurology | Admitting: Neurology

## 2021-05-13 ENCOUNTER — Other Ambulatory Visit: Payer: Self-pay

## 2021-05-13 ENCOUNTER — Other Ambulatory Visit: Payer: Self-pay | Admitting: Neurology

## 2021-05-13 DIAGNOSIS — H5462 Unqualified visual loss, left eye, normal vision right eye: Secondary | ICD-10-CM

## 2021-05-13 DIAGNOSIS — R202 Paresthesia of skin: Secondary | ICD-10-CM

## 2021-05-13 DIAGNOSIS — R51 Headache with orthostatic component, not elsewhere classified: Secondary | ICD-10-CM

## 2021-05-13 DIAGNOSIS — R531 Weakness: Secondary | ICD-10-CM

## 2021-05-13 DIAGNOSIS — R29898 Other symptoms and signs involving the musculoskeletal system: Secondary | ICD-10-CM

## 2021-05-13 DIAGNOSIS — R200 Anesthesia of skin: Secondary | ICD-10-CM

## 2021-05-13 DIAGNOSIS — G08 Intracranial and intraspinal phlebitis and thrombophlebitis: Secondary | ICD-10-CM

## 2021-05-13 DIAGNOSIS — I6389 Other cerebral infarction: Secondary | ICD-10-CM

## 2021-06-04 ENCOUNTER — Telehealth: Payer: Self-pay | Admitting: Hematology and Oncology

## 2021-06-04 NOTE — Telephone Encounter (Signed)
Pt had called in requesting to r/s her virtual visit with Dr. Al Pimple. I r/s her appt. She is aware of appt date and time.

## 2021-06-17 NOTE — Progress Notes (Signed)
[facility] CONSULT NOTE  Patient Care Team: Pcp, No as PCP - General Patient, No Pcp Per (Inactive) (General Practice)  CHIEF COMPLAINTS/PURPOSE OF CONSULTATION:  Post partum PE  ASSESSMENT & PLAN:  Obstetric pulmonary embolism, postpartum This is a very pleasant 35 year old female patient with postpartum pulmonary embolism diagnosed back in August 2022 after her last C-section currently on anticoagulation with Lovenox referred to hematology for additional recommendations.  She has had 4 other children and has had no antepartum or postpartum DVT/PE.  No family history of pulmonary embolism.  She is currently compliant with anticoagulation and has denied any ongoing symptoms or bleeding issues except for slightly heavier menstrual cycles.  Physical examination not completed, virtual visit today.  We have discussed the following details about pulmonary embolism today.  We have discussed about postpartum being a risk factor for PE as well as prior C-section for her. We have discussed duration of anticoagulation which can range from 3 to 6 months after first episode and indefinite anticoagulation for recurrent episodes. Patient was informed of risk of bleeding with blood thinners on board however benefits outweigh risks at this time.   We have discussed about avoiding any form of estrogen supplementation to reduce her risk of further clotting.  She has also mentioned that she is done with childbearing.  She was recommended to go on birth control with nonestrogen containing supplements and she expressed understanding.  With regards to hypercoagulable work-up, I believe it is reasonable to consider it since she is young at the time of her first PE although this could very well be provoked by her postpartum.  She will continue anticoagulation for another 2 to 3 months and return to clinic for follow-up when we will consider hypercoagulable work-up as well.  She is agreeable to all these  recommendations. Thank you for consulting [place] the care of this patient.  Please not hesitate to contact us with any additional questions or concerns.  No orders of the defined types were placed in this encounter.    HISTORY OF PRESENTING ILLNESS:  Nicole Landry 35 y.o. female is here because of post partum PE  This is a very pleasant 35 year old female patient with no significant past medical history except for gestational diabetes, ectopic pregnancy, postpartum pulmonary embolism referred to hematology for further recommendations.  She is here for virtual visit today.  She mentions of diagnosis of pulmonary embolism after her last C-section back in August 2022.  Since then she has been taking anticoagulation with Lovenox and has not had any complications.  She has recovered quite well.  She has had 4 other kids prior to this.  No postpartum or antepartum complications.  She has had C-sections prior and did not have any postpartum complications either.  No family history of DVT/PE.  No sudden death reported.  She is quite healthy prior to all of this.  She is done with childbirth and is anticipating going on birth control. Rest of the pertinent 10 point ROS reviewed and negative.  REVIEW OF SYSTEMS:   Constitutional: Denies fevers, chills or abnormal night sweats Eyes: Denies blurriness of vision, double vision or watery eyes Ears, nose, mouth, throat, and face: Denies mucositis or sore throat Respiratory: Denies cough, dyspnea or wheezes Cardiovascular: Denies palpitation, chest discomfort or lower extremity swelling Gastrointestinal:  Denies nausea, heartburn or change in bowel habits Skin: Denies abnormal skin rashes Lymphatics: Denies new lymphadenopathy or easy bruising Neurological:Denies numbness, tingling or new weaknesses Behavioral/Psych: Mood is  stable, no new changes  All other systems were reviewed with the patient and are negative.  MEDICAL HISTORY:  Past Medical History:   Diagnosis Date   Blood transfusion without reported diagnosis    after ectopic   Ectopic pregnancy, tubal    Gestational diabetes mellitus 2011   denies gestational DM with current pregnancy   Ovarian cyst    Status post vacuum-assisted vaginal delivery (5/17) 11/20/2015    SURGICAL HISTORY: Past Surgical History:  Procedure Laterality Date   BILATERAL SALPINGECTOMY Right 02/08/2013   Procedure:  SALPINGECTOMY;  Surgeon: Lahoma Crocker, MD;  Location: [facility];  Service: Gynecology;  Laterality: Right;   CESAREAN SECTION     CESAREAN SECTION N/A 08/18/2017   Procedure: Repeat CESAREAN SECTION;  Surgeon: Azucena Fallen, MD;  Location: [facility];  Service: Obstetrics;  Laterality: N/A;  EDD: 08/24/17 Allergy: Vicodin   CESAREAN SECTION N/A 04/12/2019   Procedure: Repeat CESAREAN SECTION;  Surgeon: Azucena Fallen, MD;  Location: [facility];  Service: Obstetrics;  Laterality: N/A;  EDD: 04/17/19 Allergy: Vicodin   CESAREAN SECTION N/A 02/21/2021   Procedure: CESAREAN SECTION;  Surgeon: Janyth Pupa, DO;  Location: [facility];  Service: Obstetrics;  Laterality: N/A;   DILATION AND CURETTAGE OF UTERUS     LAPAROSCOPY N/A 02/08/2013   Procedure: LAPAROSCOPY OPERATIVE;  Surgeon: Lahoma Crocker, MD;  Location: [facility];  Service: Gynecology;  Laterality: N/A;   SALPINGECTOMY Right 02/2013   op note in epic    SOCIAL HISTORY: Social History   Socioeconomic History   Marital status: Married    Spouse name: Not on file   Number of children: Not on file   Years of education: Not on file   Highest education level: Not on file  Occupational History   Not on file  Tobacco Use   Smoking status: Never   Smokeless tobacco: Never  Vaping Use   Vaping Use: Never used  Substance and Sexual Activity   Alcohol use: No   Drug use: No   Sexual activity: Not Currently    Birth control/protection: None  Other Topics Concern   Not on file  Social History Narrative   ** Merged History  Encounter **       Social Determinants of Health   Financial Resource Strain: Not on file  Food Insecurity: No Food Insecurity   Worried About Charity fundraiser in the Last Year: Never true   [place] in the Last Year: Never true  Transportation Needs: No Transportation Needs   Lack of Transportation (Medical): No   Lack of Transportation (Non-Medical): No  Physical Activity: Not on file  Stress: Not on file  Social Connections: Not on file  Intimate Partner Violence: Not on file    FAMILY HISTORY: Family History  Problem Relation Age of Onset   Asthma Neg Hx    Depression Neg Hx    Diabetes Neg Hx    Heart disease Neg Hx    Hypertension Neg Hx    Obesity Neg Hx    Miscarriages / Stillbirths Neg Hx     ALLERGIES:  is allergic to food and vicodin [hydrocodone-acetaminophen].  MEDICATIONS:  Current Outpatient Medications  Medication Sig Dispense Refill   ALPRAZolam (XANAX) 0.25 MG tablet Take 1-2 tabs (0.25mg -0.50mg ) 30-60 minutes before procedure. May repeat if needed.Do not drive.May cause sedation 6 tablet 0   enoxaparin (LOVENOX) 100 MG/ML injection Inject 1 mL (100 mg total) into the skin every 12 (  twelve) hours. 180 mL 0   Prenatal Vit-Fe Fumarate-FA (PRENATAL MULTIVITAMIN) TABS tablet Take 1 tablet by mouth daily at 12 noon.     No current facility-administered medications for this visit.     PHYSICAL EXAMINATION: ECOG PERFORMANCE STATUS: 0 - Asymptomatic   Patient appears alert, in no acute distress.  Rest of the physical examination was not completed given virtual visit.  LABORATORY DATA:  I have reviewed the data as listed Lab Results  Component Value Date   WBC 9.1 02/26/2021   HGB 8.9 (L) 02/26/2021   HCT 29.4 (L) 02/26/2021   MCV 83.5 02/26/2021   PLT 396 02/26/2021     Chemistry      Component Value Date/Time   NA 140 02/26/2021 0502   NA 137 10/22/2020 1134   K 3.3 (L) 02/26/2021 0502   CL 104 02/26/2021 0502   CO2 26 02/26/2021  0502   BUN <5 (L) 02/26/2021 0502   BUN 5 (L) 10/22/2020 1134   CREATININE 0.50 02/26/2021 0502      Component Value Date/Time   CALCIUM 8.9 02/26/2021 0502   ALKPHOS 74 02/26/2021 0502   AST 42 (H) 02/26/2021 0502   ALT 36 02/26/2021 0502   BILITOT 0.5 02/26/2021 0502   BILITOT <0.2 10/22/2020 1134     02/26/2021  IMPRESSION: 1. Segmental and subsegmental pulmonary emboli in the left upper, right middle, and bilateral lower lobes. No CT findings of right heart strain.   2.  Lungs are clear.   These results were called by telephone at the time of interpretation on 02/26/2021 at 8:41 am and 9:05 am to provider Lexine Baton, who verbally acknowledged these results  RADIOGRAPHIC STUDIES:  I have personally reviewed the radiological images as listed and agreed with the findings in the report. No results found.  I connected with  Nicole Landry on 06/18/21 by a video enabled telemedicine application and verified that I am speaking with the correct person using two identifiers.   I discussed the limitations of evaluation and management by telemedicine. The patient expressed understanding and agreed to proceed.  All questions were answered. The patient knows to call the clinic with any problems, questions or concerns. I spent 45 minutes in the care of this patient, out of this approximately 25 minutes was spent face-to-face in the video visit.      Rachel Moulds, MD 06/18/2021 11:30 AM

## 2021-06-18 ENCOUNTER — Telehealth: Payer: Self-pay | Admitting: Hematology and Oncology

## 2021-06-18 ENCOUNTER — Inpatient Hospital Stay: Payer: Medicaid Other | Attending: Hematology and Oncology | Admitting: Hematology and Oncology

## 2021-06-18 ENCOUNTER — Encounter: Payer: Self-pay | Admitting: Hematology and Oncology

## 2021-06-18 DIAGNOSIS — O8823 Thromboembolism in the puerperium: Secondary | ICD-10-CM | POA: Diagnosis not present

## 2021-06-18 MED ORDER — ENOXAPARIN SODIUM 100 MG/ML IJ SOSY: 100.0000 mg | PREFILLED_SYRINGE | Freq: Two times a day (BID) | INTRAMUSCULAR | 0 refills | Status: DC

## 2021-06-18 NOTE — Telephone Encounter (Signed)
Scheduled per sch msg. Called, not able to leave msg. Mailed printout  °

## 2021-06-18 NOTE — Assessment & Plan Note (Signed)
This is a very pleasant 35 year old female patient with postpartum pulmonary embolism diagnosed back in August 2022 after her last C-section currently on anticoagulation with Lovenox referred to hematology for additional recommendations.  She has had 4 other children and has had no antepartum or postpartum DVT/PE.  No family history of pulmonary embolism.  She is currently compliant with anticoagulation and has denied any ongoing symptoms or bleeding issues except for slightly heavier menstrual cycles.  Physical examination not completed, virtual visit today.  We have discussed the following details about pulmonary embolism today.  We have discussed about postpartum being a risk factor for PE as well as prior C-section for her. We have discussed duration of anticoagulation which can range from 3 to 6 months after first episode and indefinite anticoagulation for recurrent episodes. Patient was informed of risk of bleeding with blood thinners on board however benefits outweigh risks at this time.   We have discussed about avoiding any form of estrogen supplementation to reduce her risk of further clotting.  She has also mentioned that she is done with childbearing.  She was recommended to go on birth control with nonestrogen containing supplements and she expressed understanding.  With regards to hypercoagulable work-up, I believe it is reasonable to consider it since she is young at the time of her first PE although this could very well be provoked by her postpartum.  She will continue anticoagulation for another 2 to 3 months and return to clinic for follow-up when we will consider hypercoagulable work-up as well.  She is agreeable to all these recommendations. Thank you for consulting [place] the care of this patient.  Please not hesitate to contact us with any additional questions or concerns.

## 2021-07-28 ENCOUNTER — Telehealth (INDEPENDENT_AMBULATORY_CARE_PROVIDER_SITE_OTHER): Payer: Medicaid Other | Admitting: Neurology

## 2021-07-28 ENCOUNTER — Ambulatory Visit: Payer: Self-pay | Admitting: Neurology

## 2021-07-28 DIAGNOSIS — Z91199 Patient's noncompliance with other medical treatment and regimen due to unspecified reason: Secondary | ICD-10-CM

## 2021-07-28 NOTE — Progress Notes (Signed)
Patient NO SHOWED f/u appointment

## 2021-08-19 ENCOUNTER — Inpatient Hospital Stay: Payer: Medicaid Other | Attending: Hematology and Oncology | Admitting: Hematology and Oncology

## 2021-08-19 ENCOUNTER — Inpatient Hospital Stay: Payer: Medicaid Other

## 2021-08-19 DIAGNOSIS — Z803 Family history of malignant neoplasm of breast: Secondary | ICD-10-CM | POA: Insufficient documentation

## 2021-08-19 DIAGNOSIS — Z86711 Personal history of pulmonary embolism: Secondary | ICD-10-CM | POA: Insufficient documentation

## 2021-08-19 DIAGNOSIS — Z7901 Long term (current) use of anticoagulants: Secondary | ICD-10-CM | POA: Insufficient documentation

## 2021-08-19 DIAGNOSIS — R9389 Abnormal findings on diagnostic imaging of other specified body structures: Secondary | ICD-10-CM | POA: Insufficient documentation

## 2021-08-19 NOTE — Progress Notes (Unsigned)
[name] CONSULT NOTE  Patient Care Team: Pcp, No as PCP - General Patient, No Pcp Per (Inactive) (General Practice)  CHIEF COMPLAINTS/PURPOSE OF CONSULTATION:  Post partum PE  ASSESSMENT & PLAN:  No problem-specific Assessment & Plan notes found for this encounter.   No orders of the defined types were placed in this encounter.     HISTORY OF PRESENTING ILLNESS:  Nicole Landry 36 y.o. female is here because of post partum PE  This is a very pleasant 36 year old female patient with no significant past medical history except for gestational diabetes, ectopic pregnancy, postpartum pulmonary embolism referred to hematology for further recommendations.   Rest of the pertinent 10 point ROS reviewed and negative.  REVIEW OF SYSTEMS:   Constitutional: Denies fevers, chills or abnormal night sweats Eyes: Denies blurriness of vision, double vision or watery eyes Ears, nose, mouth, throat, and face: Denies mucositis or sore throat Respiratory: Denies cough, dyspnea or wheezes Cardiovascular: Denies palpitation, chest discomfort or lower extremity swelling Gastrointestinal:  Denies nausea, heartburn or change in bowel habits Skin: Denies abnormal skin rashes Lymphatics: Denies new lymphadenopathy or easy bruising Neurological:Denies numbness, tingling or new weaknesses Behavioral/Psych: Mood is stable, no new changes  All other systems were reviewed with the patient and are negative.  MEDICAL HISTORY:  Past Medical History:  Diagnosis Date   Blood transfusion without reported diagnosis    after ectopic   Ectopic pregnancy, tubal    Gestational diabetes mellitus 2011   denies gestational DM with current pregnancy   Ovarian cyst    Status post vacuum-assisted vaginal delivery (5/17) 11/20/2015    SURGICAL HISTORY: Past Surgical History:  Procedure Laterality Date   BILATERAL SALPINGECTOMY Right 02/08/2013   Procedure:  SALPINGECTOMY;  Surgeon: Lahoma Crocker, MD;  Location: [facility];  Service: Gynecology;  Laterality: Right;   CESAREAN SECTION     CESAREAN SECTION N/A 08/18/2017   Procedure: Repeat CESAREAN SECTION;  Surgeon: Azucena Fallen, MD;  Location: [facility];  Service: Obstetrics;  Laterality: N/A;  EDD: 08/24/17 Allergy: Vicodin   CESAREAN SECTION N/A 04/12/2019   Procedure: Repeat CESAREAN SECTION;  Surgeon: Azucena Fallen, MD;  Location: [facility];  Service: Obstetrics;  Laterality: N/A;  EDD: 04/17/19 Allergy: Vicodin   CESAREAN SECTION N/A 02/21/2021   Procedure: CESAREAN SECTION;  Surgeon: Janyth Pupa, DO;  Location: [facility];  Service: Obstetrics;  Laterality: N/A;   DILATION AND CURETTAGE OF UTERUS     LAPAROSCOPY N/A 02/08/2013   Procedure: LAPAROSCOPY OPERATIVE;  Surgeon: Lahoma Crocker, MD;  Location: [facility];  Service: Gynecology;  Laterality: N/A;   SALPINGECTOMY Right 02/2013   op note in epic    SOCIAL HISTORY: Social History   Socioeconomic History   Marital status: Married    Spouse name: Not on file   Number of children: Not on file   Years of education: Not on file   Highest education level: Not on file  Occupational History   Not on file  Tobacco Use   Smoking status: Never   Smokeless tobacco: Never  Vaping Use   Vaping Use: Never used  Substance and Sexual Activity   Alcohol use: No   Drug use: No   Sexual activity: Not Currently    Birth control/protection: None  Other Topics Concern   Not on file  Social History Narrative   ** Merged History Encounter **       Social Determinants of Health   Financial Resource Strain:  Not on file  Food Insecurity: No Food Insecurity   Worried About [place] in the Last Year: Never true   Ran Out of Food in the Last Year: Never true  Transportation Needs: No Transportation Needs   Lack of Transportation (Medical): No   Lack of Transportation (Non-Medical): No  Physical Activity: Not on file  Stress: Not on file  Social  Connections: Not on file  Intimate Partner Violence: Not on file    FAMILY HISTORY: Family History  Problem Relation Age of Onset   Asthma Neg Hx    Depression Neg Hx    Diabetes Neg Hx    Heart disease Neg Hx    Hypertension Neg Hx    Obesity Neg Hx    Miscarriages / Stillbirths Neg Hx     ALLERGIES:  is allergic to food and vicodin [hydrocodone-acetaminophen].  MEDICATIONS:  Current Outpatient Medications  Medication Sig Dispense Refill   ALPRAZolam (XANAX) 0.25 MG tablet Take 1-2 tabs (0.25mg -0.50mg ) 30-60 minutes before procedure. May repeat if needed.Do not drive.May cause sedation 6 tablet 0   enoxaparin (LOVENOX) 100 MG/ML injection Inject 1 mL (100 mg total) into the skin every 12 (twelve) hours. 180 mL 0   Prenatal Vit-Fe Fumarate-FA (PRENATAL MULTIVITAMIN) TABS tablet Take 1 tablet by mouth daily at 12 noon.     No current facility-administered medications for this visit.     PHYSICAL EXAMINATION: ECOG PERFORMANCE STATUS: 0 - Asymptomatic   Patient appears alert, in no acute distress.  Rest of the physical examination was not completed given virtual visit.  LABORATORY DATA:  I have reviewed the data as listed Lab Results  Component Value Date   WBC 9.1 02/26/2021   HGB 8.9 (L) 02/26/2021   HCT 29.4 (L) 02/26/2021   MCV 83.5 02/26/2021   PLT 396 02/26/2021     Chemistry      Component Value Date/Time   NA 140 02/26/2021 0502   NA 137 10/22/2020 1134   K 3.3 (L) 02/26/2021 0502   CL 104 02/26/2021 0502   CO2 26 02/26/2021 0502   BUN <5 (L) 02/26/2021 0502   BUN 5 (L) 10/22/2020 1134   CREATININE 0.50 02/26/2021 0502      Component Value Date/Time   CALCIUM 8.9 02/26/2021 0502   ALKPHOS 74 02/26/2021 0502   AST 42 (H) 02/26/2021 0502   ALT 36 02/26/2021 0502   BILITOT 0.5 02/26/2021 0502   BILITOT <0.2 10/22/2020 1134     02/26/2021  IMPRESSION: 1. Segmental and subsegmental pulmonary emboli in the left upper, right middle, and bilateral  lower lobes. No CT findings of right heart strain.   2.  Lungs are clear.   These results were called by telephone at the time of interpretation on 02/26/2021 at 8:41 am and 9:05 am to provider Nada Boozer, who verbally acknowledged these results  RADIOGRAPHIC STUDIES:  I have personally reviewed the radiological images as listed and agreed with the findings in the report. No results found.   All questions were answered. The patient knows to call the clinic with any problems, questions or concerns. I spent 45 minutes in the care of this patient, out of this approximately 25 minutes was spent face-to-face in the video visit.      Benay Pike, MD 08/19/2021 8:35 AM

## 2021-08-20 ENCOUNTER — Telehealth: Payer: Self-pay | Admitting: Hematology and Oncology

## 2021-08-20 NOTE — Telephone Encounter (Signed)
Sch per 2/14 inbasket, left msg °

## 2021-09-01 ENCOUNTER — Encounter (HOSPITAL_COMMUNITY): Payer: Self-pay

## 2021-09-01 ENCOUNTER — Other Ambulatory Visit: Payer: Self-pay | Admitting: *Deleted

## 2021-09-01 ENCOUNTER — Emergency Department (HOSPITAL_COMMUNITY)
Admission: EM | Admit: 2021-09-01 | Discharge: 2021-09-01 | Disposition: A | Discharge status: Home / Self Care | Payer: Medicaid Other | Attending: Emergency Medicine | Admitting: Emergency Medicine

## 2021-09-01 ENCOUNTER — Emergency Department (HOSPITAL_COMMUNITY): Payer: Medicaid Other

## 2021-09-01 ENCOUNTER — Other Ambulatory Visit: Payer: Self-pay

## 2021-09-01 DIAGNOSIS — N9489 Other specified conditions associated with female genital organs and menstrual cycle: Secondary | ICD-10-CM | POA: Diagnosis not present

## 2021-09-01 DIAGNOSIS — R0789 Other chest pain: Secondary | ICD-10-CM | POA: Insufficient documentation

## 2021-09-01 DIAGNOSIS — R0602 Shortness of breath: Secondary | ICD-10-CM

## 2021-09-01 DIAGNOSIS — R079 Chest pain, unspecified: Secondary | ICD-10-CM

## 2021-09-01 DIAGNOSIS — N6489 Other specified disorders of breast: Secondary | ICD-10-CM | POA: Insufficient documentation

## 2021-09-01 LAB — CBC WITH DIFFERENTIAL/PLATELET
Abs Immature Granulocytes: 0.01 10*3/uL (ref 0.00–0.07)
Basophils Absolute: 0 10*3/uL (ref 0.0–0.1)
Basophils Relative: 0 %
Eosinophils Absolute: 0.1 10*3/uL (ref 0.0–0.5)
Eosinophils Relative: 1 %
HCT: 27.3 % — ABNORMAL LOW (ref 36.0–46.0)
Hemoglobin: 8.1 g/dL — ABNORMAL LOW (ref 12.0–15.0)
Immature Granulocytes: 0 %
Lymphocytes Relative: 30 %
Lymphs Abs: 1.7 10*3/uL (ref 0.7–4.0)
MCH: 23.3 pg — ABNORMAL LOW (ref 26.0–34.0)
MCHC: 29.7 g/dL — ABNORMAL LOW (ref 30.0–36.0)
MCV: 78.4 fL — ABNORMAL LOW (ref 80.0–100.0)
Monocytes Absolute: 0.3 10*3/uL (ref 0.1–1.0)
Monocytes Relative: 5 %
Neutro Abs: 3.7 10*3/uL (ref 1.7–7.7)
Neutrophils Relative %: 64 %
Platelets: 523 10*3/uL — ABNORMAL HIGH (ref 150–400)
RBC: 3.48 MIL/uL — ABNORMAL LOW (ref 3.87–5.11)
RDW: 16.7 % — ABNORMAL HIGH (ref 11.5–15.5)
WBC: 5.7 10*3/uL (ref 4.0–10.5)
nRBC: 0 % (ref 0.0–0.2)

## 2021-09-01 LAB — COMPREHENSIVE METABOLIC PANEL
ALT: 15 U/L (ref 0–44)
AST: 15 U/L (ref 15–41)
Albumin: 4.4 g/dL (ref 3.5–5.0)
Alkaline Phosphatase: 83 U/L (ref 38–126)
Anion gap: 6 (ref 5–15)
BUN: 11 mg/dL (ref 6–20)
CO2: 27 mmol/L (ref 22–32)
Calcium: 9 mg/dL (ref 8.9–10.3)
Chloride: 106 mmol/L (ref 98–111)
Creatinine, Ser: 0.69 mg/dL (ref 0.44–1.00)
GFR, Estimated: >60 mL/min (ref >60)
Glucose, Bld: 94 mg/dL (ref 70–99)
Potassium: 4 mmol/L (ref 3.5–5.1)
Sodium: 139 mmol/L (ref 135–145)
Total Bilirubin: <0.1 mg/dL — ABNORMAL LOW (ref 0.3–1.2)
Total Protein: 7.8 g/dL (ref 6.5–8.1)

## 2021-09-01 LAB — TROPONIN I (HIGH SENSITIVITY): Troponin I (High Sensitivity): 4 ng/L (ref <18)

## 2021-09-01 LAB — I-STAT BETA HCG BLOOD, ED (MC, WL, AP ONLY): I-stat hCG, quantitative: <5 m[IU]/mL (ref <5)

## 2021-09-01 MED ORDER — IOHEXOL 350 MG/ML SOLN
100.0000 mL | Freq: Once | INTRAVENOUS | Status: AC | PRN
  Administered 2021-09-01: 75 mL via INTRAVENOUS

## 2021-09-01 NOTE — Discharge Instructions (Addendum)
Your CT shows asymmetry of the breasts with inflammatory type changes to the right breast. I discussed this with radiology and while we feel asymmetric breast feeding may be etiology, I feel that outpatient follow up and consideration of further breast imaging (like mammography) is appropriate.  I recommend follow up with a PCP and OB (if able to see sooner) as well as Cardiology as above.

## 2021-09-01 NOTE — ED Provider Triage Note (Signed)
Emergency Medicine Provider Triage Evaluation Note  Nicole Landry , a 36 y.o. female  was evaluated in triage.  Pt complains of short of breath.  She was on blood thinners for a PE that she stopped about a month ago but has been short of breath for three weeks. She reports she feels like she did when she had her Pes, at rest she is slightly chest tight but now with any moving she has shortness of breath, chest tightness and feels light headed, her heart is racing, and similar to when she had the Pes.    Physical Exam  BP (!) 173/102    Pulse 81    Temp 98.9 F (37.2 C) (Oral)    Resp 12    Ht 5\' 4"  (1.626 m)    Wt 99.8 kg    LMP 08/24/2021    SpO2 98%    Breastfeeding Yes    BMI 37.76 kg/m  Gen:   Awake, no distress   Resp:  Normal effort  MSK:   Moves extremities without difficulty  Other:  Normal speech.   Medical Decision Making  Medically screening exam initiated at 6:53 PM.  Appropriate orders placed.  AASIYAH AUERBACH was informed that the remainder of the evaluation will be completed by another provider, this initial triage assessment does not replace that evaluation, and the importance of remaining in the ED until their evaluation is complete. \ Patient non compliant with her lovenox.  Will check labs and PE study.    Kerby Less, Cristina Gong 09/01/21 1900

## 2021-09-01 NOTE — ED Notes (Signed)
Blue tube sent to lab 

## 2021-09-01 NOTE — ED Provider Notes (Signed)
[facility] DEPT Provider Note   CSN: [id number] Arrival date & time: 09/01/21  1750     History  Chief Complaint  Patient presents with   Shortness of Breath   Tachycardia   Chest Pain    Nicole Landry is a 36 y.o. female.  HPI      36 year old female with history of prior ovarian cyst, ectopic pregnancy, gestational diabetes, C-section in August, admission August 24 August 25 with acute onset shortness of breath and palpitations found to have extensive pulmonary emboli, put on Lovenox for 3-6 months, presents after self discontinuing Lovenox at 4 months with 3 weeks of progressive dyspnea and chest pain on exertion.   She had seen oncology in regards to her PE in December, and plan was to continue anticoagulation for another 2 to 3 months and return to clinic for follow-up when they will consider hypercoagulable work-up as well    Off lovenox for one month 3 weeks ago began to have symptoms Improves with rest Shortness of breath, chest tightness Dyspnea on exertion, chest tightness on exertion Lightheadedness No nausea, diaphoresis Chronic neck pain since daughter was born-getting iron, no radiation of pain Tightness middle of the chest, feels similar to prior PE No leg pain or swelling    Had preE with pregnancy No hx of htn, DM, cholesterol No family hx of early heart disease No cigarettes, etoh other drugs   Symptoms the same today      Past Medical History:  Diagnosis Date   Blood transfusion without reported diagnosis    after ectopic   Ectopic pregnancy, tubal    Gestational diabetes mellitus 2011   denies gestational DM with current pregnancy   Ovarian cyst    Status post vacuum-assisted vaginal delivery (5/17) 11/20/2015    Past Surgical History:  Procedure Laterality Date   BILATERAL SALPINGECTOMY Right 02/08/2013   Procedure:  SALPINGECTOMY;  Surgeon: Lahoma Crocker, MD;  Location: [facility];  Service:  Gynecology;  Laterality: Right;   CESAREAN SECTION     CESAREAN SECTION N/A 08/18/2017   Procedure: Repeat CESAREAN SECTION;  Surgeon: Azucena Fallen, MD;  Location: [facility];  Service: Obstetrics;  Laterality: N/A;  EDD: 08/24/17 Allergy: Vicodin   CESAREAN SECTION N/A 04/12/2019   Procedure: Repeat CESAREAN SECTION;  Surgeon: Azucena Fallen, MD;  Location: [facility];  Service: Obstetrics;  Laterality: N/A;  EDD: 04/17/19 Allergy: Vicodin   CESAREAN SECTION N/A 02/21/2021   Procedure: CESAREAN SECTION;  Surgeon: Janyth Pupa, DO;  Location: [facility];  Service: Obstetrics;  Laterality: N/A;   DILATION AND CURETTAGE OF UTERUS     LAPAROSCOPY N/A 02/08/2013   Procedure: LAPAROSCOPY OPERATIVE;  Surgeon: Lahoma Crocker, MD;  Location: [facility];  Service: Gynecology;  Laterality: N/A;   SALPINGECTOMY Right 02/2013   op note in epic    Home Medications Prior to Admission medications   Medication Sig Start Date End Date Taking? Authorizing Provider  ALPRAZolam (XANAX) 0.25 MG tablet Take 1-2 tabs (0.25mg -0.50mg ) 30-60 minutes before procedure. May repeat if needed.Do not drive.May cause sedation 04/27/21   Melvenia Beam, MD  enoxaparin (LOVENOX) 100 MG/ML injection Inject 1 mL (100 mg total) into the skin every 12 (twelve) hours. 06/18/21 09/16/21  Benay Pike, MD  Prenatal Vit-Fe Fumarate-FA (PRENATAL MULTIVITAMIN) TABS tablet Take 1 tablet by mouth daily at 12 noon.    [provider]      Allergies    Food and Vicodin [hydrocodone-acetaminophen]  Review of Systems   Review of Systems See above  Physical Exam Updated Vital Signs BP (!) 136/99 (BP Location: Right Arm)    Pulse 88    Temp 98.9 F (37.2 C) (Oral)    Resp 20    Ht 5\' 4"  (1.626 m)    Wt 99.8 kg    LMP 08/24/2021    SpO2 99%    Breastfeeding Yes    BMI 37.76 kg/m  Physical Exam Vitals and nursing note reviewed.  Constitutional:      General: She is not in acute distress.    Appearance: She is  well-developed. She is not diaphoretic.  HENT:     Head: Normocephalic and atraumatic.  Eyes:     Conjunctiva/sclera: Conjunctivae normal.  Cardiovascular:     Rate and Rhythm: Normal rate and regular rhythm.     Heart sounds: Normal heart sounds. No murmur heard.   No friction rub. No gallop.  Pulmonary:     Effort: Pulmonary effort is normal. No respiratory distress.     Breath sounds: Normal breath sounds. No wheezing or rales.  Abdominal:     General: There is no distension.     Palpations: Abdomen is soft.     Tenderness: There is no abdominal tenderness. There is no guarding.  Musculoskeletal:        General: No tenderness.     Cervical back: Normal range of motion.  Skin:    General: Skin is warm and dry.     Findings: No erythema or rash.  Neurological:     Mental Status: She is alert and oriented to person, place, and time.    ED Results / Procedures / Treatments   Labs (all labs ordered are listed, but only abnormal results are displayed) Labs Reviewed  COMPREHENSIVE METABOLIC PANEL - Abnormal; Notable for the following components:      Result Value   Total Bilirubin <0.1 (*)    All other components within normal limits  CBC WITH DIFFERENTIAL/PLATELET - Abnormal; Notable for the following components:   RBC 3.48 (*)    Hemoglobin 8.1 (*)    HCT 27.3 (*)    MCV 78.4 (*)    MCH 23.3 (*)    MCHC 29.7 (*)    RDW 16.7 (*)    Platelets 523 (*)    All other components within normal limits  I-STAT BETA HCG BLOOD, ED ([facility], [facility], [facility] ONLY)  TROPONIN I (HIGH SENSITIVITY)  TROPONIN I (HIGH SENSITIVITY)    EKG EKG Interpretation  Date/Time:  Monday September 01 2021 17:58:49 EST Ventricular Rate:  88 PR Interval:  159 QRS Duration: 85 QT Interval:  345 QTC Calculation: 418 R Axis:   50 Text Interpretation: Sinus rhythm No significant change since last tracing Confirmed by Blanchie Dessert [phone]) on 09/02/2021 8:24:51 AM  Radiology DG Chest 2 View  Result Date:  09/01/2021 CLINICAL DATA:  Shortness of breath, chest pain EXAM: CHEST - 2 VIEW COMPARISON:  06/23/2019 FINDINGS: The heart size and mediastinal contours are within normal limits. Both lungs are clear. The visualized skeletal structures are unremarkable. IMPRESSION: No active cardiopulmonary disease. Electronically Signed   By: Rolm Baptise M.D.   On: 09/01/2021 18:54   CT Angio Chest PE W and/or Wo Contrast  Result Date: 09/01/2021 CLINICAL DATA:  Shortness of breath. EXAM: CT ANGIOGRAPHY CHEST WITH CONTRAST TECHNIQUE: Multidetector CT imaging of the chest was performed using the standard protocol during bolus administration of intravenous contrast. Multiplanar CT image  reconstructions and MIPs were obtained to evaluate the vascular anatomy. RADIATION DOSE REDUCTION: This exam was performed according to the departmental dose-optimization program which includes automated exposure control, adjustment of the mA and/or kV according to patient size and/or use of iterative reconstruction technique. CONTRAST:  3mL OMNIPAQUE IOHEXOL 350 MG/ML SOLN COMPARISON:  02/26/2021 FINDINGS: Cardiovascular: The heart is normal in size. No pericardial effusion. The aorta is normal in caliber. No dissection or atherosclerotic calcification. The branch vessels are patent. The pulmonary arterial tree is well opacified. No filling defects to suggest pulmonary embolism. Mediastinum/Nodes: No mediastinal or hilar mass or lymphadenopathy. Stable residual thymic tissue noted in the anterior mediastinum. Lungs/Pleura: No acute pulmonary findings. No infiltrates, edema or effusions. No worrisome pulmonary lesions or pulmonary nodules. Upper Abdomen: No significant upper abdominal findings. Musculoskeletal: Diffuse asymmetric density in the right breast compared to the left. Recommend clinical correlation. No supraclavicular or axillary adenopathy. The bony thorax is intact. Review of the MIP images confirms the above findings. IMPRESSION:  1. No CT findings for pulmonary embolism. 2. Normal thoracic aorta. 3. No acute pulmonary findings. 4. Diffuse asymmetric density in the right breast compared to the left. Possible inflammatory process. Recommend clinical correlation. Electronically Signed   By: Marijo Sanes M.D.   On: 09/01/2021 21:32    Procedures Procedures    Medications Ordered in ED Medications  iohexol (OMNIPAQUE) 350 MG/ML injection 100 mL (75 mLs Intravenous Contrast Given 09/01/21 2112)    ED Course/ Medical Decision Making/ A&P                            36 year old female with history of prior ovarian cyst, ectopic pregnancy, gestational diabetes, C-section in August, admission August 24 August 25 with acute onset shortness of breath and palpitations found to have extensive pulmonary emboli, put on Lovenox for 3-6 months, presents after self discontinuing Lovenox at 4 months with 3 weeks of progressive dyspnea and chest pain on exertion.   Differential diagnosis for chest pain includes pulmonary embolus, dissection, pneumothorax, pneumonia, ACS, myocarditis, pericarditis, CHF.  EKG was done and evaluate by me and showed no acute ST changes and no signs of pericarditis. Chest x-ray was done and evaluated by me and radiology and showed no sign of pneumonia, cardiomegaly, pulmonary edema or pneumothorax.  Patient is low risk HEART score and had negative troponin more than 6 hours after symptoms, has no resting chest discomfort and doubt ACS.  Do not feel history or exam are consistent with aortic dissection.  Labs personally evaluated by me--Has anemia which is stable since August, no significant electrolyte abnormalities.  Pregnacy test negative  CT PE study personally evaluated by me and Radiology and shows no sign of acute PE or other intrathoracic abnormality.  Does show asymmetry of breast tissue-she does report only breastfeeding on the right sidewhich may be etiology but also has family history of breast cancer and  feel outpatient follow up/consideration of dedicated breast imaging is appropriate. Does not have pain to indicate cellulitis or mastitis. Discussed given hematology had recommended 3-6 months, she completed 4 and has no evidence of continued thrombosis/embolism that it is reasonable to not restart lovenox and follow up as an outpatient.  Also discussed recommendation for close follow up with Cardiology given exertional chest tightness and consideration of further cardiac work up.  She is hypertensive in ED but improved spontaneously to 136/99, recommend outpatient follow up and recheck of this. Discussed reasons to  return in detail. Patient discharged in stable condition with understanding of reasons to return.          Final Clinical Impression(s) / ED Diagnoses Final diagnoses:  Chest pain, unspecified type  Shortness of breath  Breasts asymmetrical    Rx / DC Orders ED Discharge Orders     None         Gareth Morgan, MD 09/02/21 1148

## 2021-09-01 NOTE — ED Triage Notes (Signed)
Patient report s that she developed a PE after giving birth 5 months ago and was placed on Lovenox. Patient received Lovenox x 3 months and was suggested to the patient that she needed it for another 3 months. Patient stopped after receiving Lovenox for 4 1/2 months. Patient is having increased SOB, chest pain, and tachycardia when doing any activity.

## 2021-09-02 ENCOUNTER — Inpatient Hospital Stay (HOSPITAL_BASED_OUTPATIENT_CLINIC_OR_DEPARTMENT_OTHER): Payer: Medicaid Other | Admitting: Hematology and Oncology

## 2021-09-02 ENCOUNTER — Encounter: Payer: Self-pay | Admitting: Hematology and Oncology

## 2021-09-02 ENCOUNTER — Inpatient Hospital Stay: Payer: Medicaid Other

## 2021-09-02 VITALS — BP 138/82 | HR 88 | Temp 97.7°F | Resp 18 | Ht 64.0 in | Wt 231.6 lb

## 2021-09-02 DIAGNOSIS — Z803 Family history of malignant neoplasm of breast: Secondary | ICD-10-CM | POA: Diagnosis not present

## 2021-09-02 DIAGNOSIS — I2699 Other pulmonary embolism without acute cor pulmonale: Secondary | ICD-10-CM

## 2021-09-02 DIAGNOSIS — R9389 Abnormal findings on diagnostic imaging of other specified body structures: Secondary | ICD-10-CM

## 2021-09-02 DIAGNOSIS — Z7901 Long term (current) use of anticoagulants: Secondary | ICD-10-CM | POA: Diagnosis not present

## 2021-09-02 DIAGNOSIS — Z86711 Personal history of pulmonary embolism: Secondary | ICD-10-CM | POA: Diagnosis not present

## 2021-09-02 NOTE — Progress Notes (Signed)
[facility] CONSULT NOTE  Patient Care Team: Pcp, No as PCP - General Patient, No Pcp Per (Inactive) (General Practice)  CHIEF COMPLAINTS/PURPOSE OF CONSULTATION:  Post partum PE  ASSESSMENT & PLAN:  PE (pulmonary thromboembolism) ([place]) This is a very pleasant 36 year old female patient with postpartum pulmonary embolism diagnosed back in August 2022 after her last C-section currently on anticoagulation with Lovenox referred to hematology.  During her last visit we have discussed about anticoagulation for 3 to 6 months which Nicole Landry has completed.  Nicole Landry is currently not on any anticoagulation.  I have discussed about considering contraception if Nicole Landry is not willing to pursue any further pregnancies.  Nicole Landry understands that Nicole Landry is at high risk of recurrence for DVT/PE if Nicole Landry becomes pregnant again.  Nicole Landry may even need to take prophylactic anticoagulation if Nicole Landry is pregnant again.  Right now it appears that Nicole Landry has no current evidence of PE, Nicole Landry had a CT PE protocol yesterday.Marland Kitchen  Nicole Landry will come back next week for hypercoagulable work-up, this has been ordered today.  Abnormal findings on diagnostic imaging of other specified body structures Patient was found to have some abnormal density of the right breast yesterday on CT imaging for PE evaluation.  No definitive palpable masses on exam however some abnormal density in the right breast noted which could be very well secondary to lactation.  Nicole Landry is worried because Nicole Landry has family history of breast cancer in her mom's sister as well as maternal grandmother.  I have ordered a mammogram and an ultrasound for further evaluation.  We will do a telephone visit or a telehealth visit in 4 weeks to review results.   Orders Placed This Encounter  Procedures   Lupus anticoagulant panel    Standing Status:   Future    Standing Expiration Date:   09/02/2022   Prothrombin gene mutation    Standing Status:   Future    Standing Expiration Date:   09/02/2022    Factor 5 leiden    Standing Status:   Future    Standing Expiration Date:   09/02/2022   Cardiolipin antibodies, IgG, IgM, IgA    Standing Status:   Future    Standing Expiration Date:   09/02/2022   Antithrombin III    Standing Status:   Future    Standing Expiration Date:   09/02/2022   Protein C activity    Standing Status:   Future    Standing Expiration Date:   09/02/2022   Protein S activity    Standing Status:   Future    Standing Expiration Date:   09/02/2022   Beta-2-glycoprotein i abs, IgG/M/A    Standing Status:   Future    Standing Expiration Date:   09/02/2022      HISTORY OF PRESENTING ILLNESS:  Nicole Landry 36 y.o. female is here because of post partum PE  This is a very pleasant 36 year old female patient with no significant past medical history except for gestational diabetes, ectopic pregnancy, postpartum pulmonary embolism referred to hematology for further recommendations.  Since last visit, Nicole Landry went to the ER yesterday with some chest pain and shortness of breath, had a CT angiogram which did not show any evidence of PE.  However was found to have abnormal right breast density.  Nicole Landry is here for follow-up after ED visit.  Nicole Landry says since yesterday, her chest pain and shortness of breath has improved. Nicole Landry is worried about the breast density abnormality noted on the CT  because Nicole Landry has breast cancer in her family, her mom's mom and mom and sister both had breast cancer.  Nicole Landry is currently breast-feeding. Rest of the pertinent 10 point ROS reviewed and negative.  MEDICAL HISTORY:  Past Medical History:  Diagnosis Date   Blood transfusion without reported diagnosis    after ectopic   Ectopic pregnancy, tubal    Gestational diabetes mellitus 2011   denies gestational DM with current pregnancy   Ovarian cyst    Status post vacuum-assisted vaginal delivery (5/17) 11/20/2015    SURGICAL HISTORY: Past Surgical History:  Procedure Laterality Date   BILATERAL  SALPINGECTOMY Right 02/08/2013   Procedure:  SALPINGECTOMY;  Surgeon: Lahoma Crocker, MD;  Location: [facility];  Service: Gynecology;  Laterality: Right;   CESAREAN SECTION     CESAREAN SECTION N/A 08/18/2017   Procedure: Repeat CESAREAN SECTION;  Surgeon: Azucena Fallen, MD;  Location: [facility];  Service: Obstetrics;  Laterality: N/A;  EDD: 08/24/17 Allergy: Vicodin   CESAREAN SECTION N/A 04/12/2019   Procedure: Repeat CESAREAN SECTION;  Surgeon: Azucena Fallen, MD;  Location: [facility];  Service: Obstetrics;  Laterality: N/A;  EDD: 04/17/19 Allergy: Vicodin   CESAREAN SECTION N/A 02/21/2021   Procedure: CESAREAN SECTION;  Surgeon: Janyth Pupa, DO;  Location: [facility];  Service: Obstetrics;  Laterality: N/A;   DILATION AND CURETTAGE OF UTERUS     LAPAROSCOPY N/A 02/08/2013   Procedure: LAPAROSCOPY OPERATIVE;  Surgeon: Lahoma Crocker, MD;  Location: [facility];  Service: Gynecology;  Laterality: N/A;   SALPINGECTOMY Right 02/2013   op note in epic    SOCIAL HISTORY: Social History   Socioeconomic History   Marital status: Married    Spouse name: Not on file   Number of children: Not on file   Years of education: Not on file   Highest education level: Not on file  Occupational History   Not on file  Tobacco Use   Smoking status: Never   Smokeless tobacco: Never  Vaping Use   Vaping Use: Never used  Substance and Sexual Activity   Alcohol use: No   Drug use: No   Sexual activity: Not Currently    Birth control/protection: None  Other Topics Concern   Not on file  Social History Narrative   ** Merged History Encounter **       Social Determinants of Health   Financial Resource Strain: Not on file  Food Insecurity: No Food Insecurity   Worried About Charity fundraiser in the Last Year: Never true   Success in the Last Year: Never true  Transportation Needs: No Transportation Needs   Lack of Transportation (Medical): No   Lack of Transportation  (Non-Medical): No  Physical Activity: Not on file  Stress: Not on file  Social Connections: Not on file  Intimate Partner Violence: Not on file    FAMILY HISTORY: Family History  Problem Relation Age of Onset   Asthma Neg Hx    Depression Neg Hx    Diabetes Neg Hx    Heart disease Neg Hx    Hypertension Neg Hx    Obesity Neg Hx    Miscarriages / Stillbirths Neg Hx     ALLERGIES:  is allergic to food and vicodin [hydrocodone-acetaminophen].  MEDICATIONS:  Current Outpatient Medications  Medication Sig Dispense Refill   ALPRAZolam (XANAX) 0.25 MG tablet Take 1-2 tabs (0.25mg -0.50mg ) 30-60 minutes before procedure. May repeat if needed.Do not drive.May cause sedation 6 tablet 0  enoxaparin (LOVENOX) 100 MG/ML injection Inject 1 mL (100 mg total) into the skin every 12 (twelve) hours. 180 mL 0   Prenatal Vit-Fe Fumarate-FA (PRENATAL MULTIVITAMIN) TABS tablet Take 1 tablet by mouth daily at 12 noon.     No current facility-administered medications for this visit.     PHYSICAL EXAMINATION: ECOG PERFORMANCE STATUS: 0 - Asymptomatic  Physical Exam Constitutional:      Appearance: Normal appearance.  Pulmonary:     Effort: Pulmonary effort is normal.     Breath sounds: Normal breath sounds.  Chest:     Comments: Bilateral breast examined.  No palpable masses but some abnormal density in the right breast.  No palpable regional adenopathy.  This could very well be lactation changes however it is hard to determine without further imaging. Musculoskeletal:        General: No swelling.     Cervical back: Normal range of motion and neck supple. No rigidity.  Lymphadenopathy:     Cervical: No cervical adenopathy.  Skin:    General: Skin is warm.  Neurological:     Mental Status: Nicole Landry is alert.    LABORATORY DATA:  I have reviewed the data as listed Lab Results  Component Value Date   WBC 5.7 09/01/2021   HGB 8.1 (L) 09/01/2021   HCT 27.3 (L) 09/01/2021   MCV 78.4 (L)  09/01/2021   PLT 523 (H) 09/01/2021     Chemistry      Component Value Date/Time   NA 139 09/01/2021 1913   NA 137 10/22/2020 1134   K 4.0 09/01/2021 1913   CL 106 09/01/2021 1913   CO2 27 09/01/2021 1913   BUN 11 09/01/2021 1913   BUN 5 (L) 10/22/2020 1134   CREATININE 0.69 09/01/2021 1913      Component Value Date/Time   CALCIUM 9.0 09/01/2021 1913   ALKPHOS 83 09/01/2021 1913   AST 15 09/01/2021 1913   ALT 15 09/01/2021 1913   BILITOT <0.1 (L) 09/01/2021 1913   BILITOT <0.2 10/22/2020 1134     02/26/2021  IMPRESSION: 1. Segmental and subsegmental pulmonary emboli in the left upper, right middle, and bilateral lower lobes. No CT findings of right heart strain.   2.  Lungs are clear.   These results were called by telephone at the time of interpretation on 02/26/2021 at 8:41 am and 9:05 am to provider Nada Boozer, who verbally acknowledged these results  RADIOGRAPHIC STUDIES:  I have personally reviewed the radiological images as listed and agreed with the findings in the report. DG Chest 2 View  Result Date: 09/01/2021 CLINICAL DATA:  Shortness of breath, chest pain EXAM: CHEST - 2 VIEW COMPARISON:  06/23/2019 FINDINGS: The heart size and mediastinal contours are within normal limits. Both lungs are clear. The visualized skeletal structures are unremarkable. IMPRESSION: No active cardiopulmonary disease. Electronically Signed   By: Rolm Baptise M.D.   On: 09/01/2021 18:54   CT Angio Chest PE W and/or Wo Contrast  Result Date: 09/01/2021 CLINICAL DATA:  Shortness of breath. EXAM: CT ANGIOGRAPHY CHEST WITH CONTRAST TECHNIQUE: Multidetector CT imaging of the chest was performed using the standard protocol during bolus administration of intravenous contrast. Multiplanar CT image reconstructions and MIPs were obtained to evaluate the vascular anatomy. RADIATION DOSE REDUCTION: This exam was performed according to the departmental dose-optimization program which includes  automated exposure control, adjustment of the mA and/or kV according to patient size and/or use of iterative reconstruction technique. CONTRAST:  58mL OMNIPAQUE IOHEXOL  350 MG/ML SOLN COMPARISON:  02/26/2021 FINDINGS: Cardiovascular: The heart is normal in size. No pericardial effusion. The aorta is normal in caliber. No dissection or atherosclerotic calcification. The branch vessels are patent. The pulmonary arterial tree is well opacified. No filling defects to suggest pulmonary embolism. Mediastinum/Nodes: No mediastinal or hilar mass or lymphadenopathy. Stable residual thymic tissue noted in the anterior mediastinum. Lungs/Pleura: No acute pulmonary findings. No infiltrates, edema or effusions. No worrisome pulmonary lesions or pulmonary nodules. Upper Abdomen: No significant upper abdominal findings. Musculoskeletal: Diffuse asymmetric density in the right breast compared to the left. Recommend clinical correlation. No supraclavicular or axillary adenopathy. The bony thorax is intact. Review of the MIP images confirms the above findings. IMPRESSION: 1. No CT findings for pulmonary embolism. 2. Normal thoracic aorta. 3. No acute pulmonary findings. 4. Diffuse asymmetric density in the right breast compared to the left. Possible inflammatory process. Recommend clinical correlation. Electronically Signed   By: Marijo Sanes M.D.   On: 09/01/2021 21:32     All questions were answered. The patient knows to call the clinic with any problems, questions or concerns. I spent 30 minutes in the care of this patient, including history, physical exam, review of records, counseling and coordination of care.    Benay Pike, MD 09/02/2021 5:04 PM

## 2021-09-02 NOTE — Assessment & Plan Note (Addendum)
This is a very pleasant 36 year old female patient with postpartum pulmonary embolism diagnosed back in August 2022 after her last C-section currently on anticoagulation with Lovenox referred to hematology.  During her last visit we have discussed about anticoagulation for 3 to 6 months which she has completed.  She is currently not on any anticoagulation.  I have discussed about considering contraception if she is not willing to pursue any further pregnancies.  She understands that she is at high risk of recurrence for DVT/PE if she becomes pregnant again.  She may even need to take prophylactic anticoagulation if she is pregnant again.  Right now it appears that she has no current evidence of PE, she had a CT PE protocol yesterday.Marland Kitchen  She will come back next week for hypercoagulable work-up, this has been ordered today.

## 2021-09-02 NOTE — Assessment & Plan Note (Signed)
Patient was found to have some abnormal density of the right breast yesterday on CT imaging for PE evaluation.  No definitive palpable masses on exam however some abnormal density in the right breast noted which could be very well secondary to lactation.  She is worried because she has family history of breast cancer in her mom's sister as well as maternal grandmother.  I have ordered a mammogram and an ultrasound for further evaluation.  We will do a telephone visit or a telehealth visit in 4 weeks to review results.

## 2021-09-03 ENCOUNTER — Other Ambulatory Visit: Payer: Self-pay | Admitting: *Deleted

## 2021-09-03 ENCOUNTER — Telehealth: Payer: Self-pay | Admitting: Hematology and Oncology

## 2021-09-03 DIAGNOSIS — R9389 Abnormal findings on diagnostic imaging of other specified body structures: Secondary | ICD-10-CM

## 2021-09-03 NOTE — Telephone Encounter (Signed)
Scheduled appointment per 02/28 los. Patient aware.  ?

## 2021-09-10 ENCOUNTER — Other Ambulatory Visit: Payer: Self-pay

## 2021-09-10 ENCOUNTER — Inpatient Hospital Stay: Payer: Medicaid Other | Attending: Hematology and Oncology

## 2021-09-10 DIAGNOSIS — I2699 Other pulmonary embolism without acute cor pulmonale: Secondary | ICD-10-CM

## 2021-09-10 DIAGNOSIS — Z86711 Personal history of pulmonary embolism: Secondary | ICD-10-CM | POA: Insufficient documentation

## 2021-09-10 DIAGNOSIS — Z7901 Long term (current) use of anticoagulants: Secondary | ICD-10-CM | POA: Insufficient documentation

## 2021-09-10 LAB — ANTITHROMBIN III: AntiThromb III Func: 130 % — ABNORMAL HIGH (ref 75–120)

## 2021-09-11 LAB — LUPUS ANTICOAGULANT PANEL
DRVVT: 30.7 s (ref 0.0–47.0)
PTT Lupus Anticoagulant: 32.3 s (ref 0.0–43.5)

## 2021-09-11 LAB — PROTEIN C ACTIVITY: Protein C Activity: 127 % (ref 73–180)

## 2021-09-11 LAB — PROTEIN S ACTIVITY: Protein S Activity: 91 % (ref 63–140)

## 2021-09-12 LAB — BETA-2-GLYCOPROTEIN I ABS, IGG/M/A
Beta-2 Glyco I IgG: <9 GPI IgG units (ref 0–20)
Beta-2-Glycoprotein I IgA: <9 GPI IgA units (ref 0–25)
Beta-2-Glycoprotein I IgM: <9 GPI IgM units (ref 0–32)

## 2021-09-12 LAB — CARDIOLIPIN ANTIBODIES, IGG, IGM, IGA
Anticardiolipin IgA: <9 APL U/mL (ref 0–11)
Anticardiolipin IgG: <9 GPL U/mL (ref 0–14)
Anticardiolipin IgM: <9 MPL U/mL (ref 0–12)

## 2021-09-15 LAB — PROTHROMBIN GENE MUTATION

## 2021-09-17 ENCOUNTER — Other Ambulatory Visit: Payer: Self-pay | Admitting: Hematology and Oncology

## 2021-09-17 ENCOUNTER — Ambulatory Visit
Admission: RE | Admit: 2021-09-17 | Discharge: 2021-09-17 | Disposition: A | Discharge status: Home / Self Care | Payer: Medicaid Other | Source: Ambulatory Visit | Attending: Hematology and Oncology | Admitting: Hematology and Oncology

## 2021-09-17 DIAGNOSIS — R9389 Abnormal findings on diagnostic imaging of other specified body structures: Secondary | ICD-10-CM

## 2021-09-19 LAB — FACTOR 5 LEIDEN

## 2021-10-08 ENCOUNTER — Encounter: Payer: Self-pay | Admitting: Hematology and Oncology

## 2021-10-08 ENCOUNTER — Inpatient Hospital Stay: Payer: Medicaid Other | Attending: Hematology and Oncology | Admitting: Hematology and Oncology

## 2021-10-08 DIAGNOSIS — R9389 Abnormal findings on diagnostic imaging of other specified body structures: Secondary | ICD-10-CM | POA: Diagnosis not present

## 2021-10-08 DIAGNOSIS — I2699 Other pulmonary embolism without acute cor pulmonale: Secondary | ICD-10-CM | POA: Diagnosis not present

## 2021-10-08 NOTE — Assessment & Plan Note (Addendum)
She was noted to have abnormal density CT scan done for her PE evaluation.  The mammogram and ultrasound showed probably benign asymmetrically increased density of the right breast compared to the left.  This was thought to be physiologic, recommendation was to repeat in 6 months for additional follow-up.  She reports no new changes except for some pain which was thought to be normal.  ? ?Patient was strongly encouraged to reach out to the physician if she notices any changes in the right breast. ?She is scheduled for mammogram and October.  She is hoping to be done with breast-feeding around August 2023. ?

## 2021-10-08 NOTE — Assessment & Plan Note (Addendum)
This is a very pleasant 36 year old female patient with postpartum pulmonary embolism diagnosed back in August 2022 after her last C-section currently on anticoagulation with Lovenox referred to hematology.  During her last visit we have discussed about anticoagulation for 3 to 6 months which she has completed.  She is currently not on any anticoagulation.  I have discussed about considering contraception if she is not willing to pursue any further pregnancies.  She understands that she is at high risk of recurrence for DVT/PE if she becomes pregnant again.   ?She may even need to take prophylactic anticoagulation if she is pregnant again ?Hypercoagulable work up negative. ?I have discussed everything with her today.  She expressed understanding of all the recommendations ?

## 2021-10-08 NOTE — Progress Notes (Signed)
[facility] ?CONSULT NOTE ? ?Patient Care Team: ?Pcp, No as PCP - General ?Patient, No Pcp Per (Inactive) (General Practice) ? ?CHIEF COMPLAINTS/PURPOSE OF CONSULTATION:  ?Post partum PE ? ?ASSESSMENT & PLAN:  ? ?PE (pulmonary thromboembolism) ([place]) ?This is a very pleasant 36 year old female patient with postpartum pulmonary embolism diagnosed back in August 2022 after her last C-section currently on anticoagulation with Lovenox referred to hematology.  During her last visit we have discussed about anticoagulation for 3 to 6 months which she has completed.  She is currently not on any anticoagulation.  I have discussed about considering contraception if she is not willing to pursue any further pregnancies.  She understands that she is at high risk of recurrence for DVT/PE if she becomes pregnant again.   ?She may even need to take prophylactic anticoagulation if she is pregnant again ?Hypercoagulable work up negative. ?I have discussed everything with her today.  She expressed understanding of all the recommendations ? ?Abnormal findings on diagnostic imaging of other specified body structures ?She was noted to have abnormal density CT scan done for her PE evaluation.  The mammogram and ultrasound showed probably benign asymmetrically increased density of the right breast compared to the left.  This was thought to be physiologic, recommendation was to repeat in 6 months for additional follow-up.  She reports no new changes except for some pain which was thought to be normal.  ? ?Patient was strongly encouraged to reach out to the physician if she notices any changes in the right breast. ?She is scheduled for mammogram and October.  She is hoping to be done with breast-feeding around August 2023. ? ? ?No orders of the defined types were placed in this encounter. ? ?HISTORY OF PRESENTING ILLNESS:  ?Nicole Landry 36 y.o. female who was initially seen because of postpartum PE.   ? ?This is a very  pleasant 36 year old female patient with no significant past medical history except for gestational diabetes, ectopic pregnancy, postpartum pulmonary embolism referred to hematology for further recommendations. ?She was also worried about the breast density abnormality noted on the CT because she has breast cancer in her family, her mom's mom and mom and sister both had breast cancer.   ? ?Interval history ? ?Since last visit she had a mammogram and ultrasound that showed probably benign asymmetrically increased density of the right breast compared to the left. This was thought to be physiologic, recommendation was to repeat in 6 months for additional follow up ?She continues to feel some pain in that breast which according to her was told that it was normal. ?No other lumps noted.  She plans to stop breast-feeding in August 2023.   ?She was hoping she can repeat imaging in October as scheduled. ?Rest of the pertinent 10 point ROS reviewed and negative. ? ?MEDICAL HISTORY:  ?Past Medical History:  ?Diagnosis Date  ? Blood transfusion without reported diagnosis   ? after ectopic  ? Ectopic pregnancy, tubal   ? Gestational diabetes mellitus 2011  ? denies gestational DM with current pregnancy  ? Ovarian cyst   ? Status post vacuum-assisted vaginal delivery (5/17) 11/20/2015  ? ? ?SURGICAL HISTORY: ?Past Surgical History:  ?Procedure Laterality Date  ? BILATERAL SALPINGECTOMY Right 02/08/2013  ? Procedure:  SALPINGECTOMY;  Surgeon: Lahoma Crocker, MD;  Location: [facility];  Service: Gynecology;  Laterality: Right;  ? CESAREAN SECTION    ? CESAREAN SECTION N/A 08/18/2017  ? Procedure: Repeat CESAREAN SECTION;  Surgeon: Azucena Fallen,  MD;  Location: [facility];  Service: Obstetrics;  Laterality: N/A;  EDD: 08/24/17 ?Allergy: Vicodin  ? CESAREAN SECTION N/A 04/12/2019  ? Procedure: Repeat CESAREAN SECTION;  Surgeon: Azucena Fallen, MD;  Location: [facility];  Service: Obstetrics;  Laterality: N/A;  EDD:  04/17/19 ?Allergy: Vicodin  ? CESAREAN SECTION N/A 02/21/2021  ? Procedure: CESAREAN SECTION;  Surgeon: Janyth Pupa, DO;  Location: [facility];  Service: Obstetrics;  Laterality: N/A;  ? DILATION AND CURETTAGE OF UTERUS    ? LAPAROSCOPY N/A 02/08/2013  ? Procedure: LAPAROSCOPY OPERATIVE;  Surgeon: Lahoma Crocker, MD;  Location: [facility];  Service: Gynecology;  Laterality: N/A;  ? SALPINGECTOMY Right 02/2013  ? op note in epic  ? ? ?SOCIAL HISTORY: ?Social History  ? ?Socioeconomic History  ? Marital status: Married  ?  Spouse name: Not on file  ? Number of children: Not on file  ? Years of education: Not on file  ? Highest education level: Not on file  ?Occupational History  ? Not on file  ?Tobacco Use  ? Smoking status: Never  ? Smokeless tobacco: Never  ?Vaping Use  ? Vaping Use: Never used  ?Substance and Sexual Activity  ? Alcohol use: No  ? Drug use: No  ? Sexual activity: Not Currently  ?  Birth control/protection: None  ?Other Topics Concern  ? Not on file  ?Social History Narrative  ? ** Merged History Encounter **  ?    ? ?Social Determinants of Health  ? ?Financial Resource Strain: Not on file  ?Food Insecurity: No Food Insecurity  ? Worried About Charity fundraiser in the Last Year: Never true  ? Ran Out of Food in the Last Year: Never true  ?Transportation Needs: No Transportation Needs  ? Lack of Transportation (Medical): No  ? Lack of Transportation (Non-Medical): No  ?Physical Activity: Not on file  ?Stress: Not on file  ?Social Connections: Not on file  ?Intimate Partner Violence: Not on file  ? ? ?FAMILY HISTORY: ?Family History  ?Problem Relation Age of Onset  ? Cancer Maternal Grandmother   ?     unknown cancer in her 21s  ? Asthma Neg Hx   ? Depression Neg Hx   ? Diabetes Neg Hx   ? Heart disease Neg Hx   ? Hypertension Neg Hx   ? Obesity Neg Hx   ? Miscarriages / Stillbirths Neg Hx   ? ? ?ALLERGIES:  is allergic to food and vicodin [hydrocodone-acetaminophen]. ? ?MEDICATIONS:  ?Current  Outpatient Medications  ?Medication Sig Dispense Refill  ? ALPRAZolam (XANAX) 0.25 MG tablet Take 1-2 tabs (0.25mg -0.50mg ) 30-60 minutes before procedure. May repeat if needed.Do not drive.May cause sedation 6 tablet 0  ? enoxaparin (LOVENOX) 100 MG/ML injection Inject 1 mL (100 mg total) into the skin every 12 (twelve) hours. 180 mL 0  ? Prenatal Vit-Fe Fumarate-FA (PRENATAL MULTIVITAMIN) TABS tablet Take 1 tablet by mouth daily at 12 noon.    ? ?No current facility-administered medications for this visit.  ? ? ? ?PHYSICAL EXAMINATION: ?ECOG PERFORMANCE STATUS: 0 - Asymptomatic ? ?PE deferred, telephone visit. ? ?LABORATORY DATA:  ?I have reviewed the data as listed ?Lab Results  ?Component Value Date  ? WBC 5.7 09/01/2021  ? HGB 8.1 (L) 09/01/2021  ? HCT 27.3 (L) 09/01/2021  ? MCV 78.4 (L) 09/01/2021  ? PLT 523 (H) 09/01/2021  ? ?  Chemistry   ?   ?Component Value Date/Time  ? NA 139 09/01/2021  1913  ? NA 137 10/22/2020 1134  ? K 4.0 09/01/2021 1913  ? CL 106 09/01/2021 1913  ? CO2 27 09/01/2021 1913  ? BUN 11 09/01/2021 1913  ? BUN 5 (L) 10/22/2020 1134  ? CREATININE 0.69 09/01/2021 1913  ?    ?Component Value Date/Time  ? CALCIUM 9.0 09/01/2021 1913  ? ALKPHOS 83 09/01/2021 1913  ? AST 15 09/01/2021 1913  ? ALT 15 09/01/2021 1913  ? BILITOT <0.1 (L) 09/01/2021 1913  ? BILITOT <0.2 10/22/2020 1134  ?  ? ?02/26/2021 ? ?IMPRESSION: ?1. Segmental and subsegmental pulmonary emboli in the left upper, ?right middle, and bilateral lower lobes. No CT findings of right ?heart strain. ?  ?2.  Lungs are clear. ?  ?These results were called by telephone at the time of interpretation ?on 02/26/2021 at 8:41 am and 9:05 am to provider Nada Boozer, who ?verbally acknowledged these results ? ?RADIOGRAPHIC STUDIES: ? ?I have personally reviewed the radiological images as listed and agreed with the findings in the report. ?US BREAST LTD UNI RIGHT INC AXILLA ? ?Result Date: 09/17/2021 ?CLINICAL DATA:  36 year old breast-feeding,  six-month post partum female with asymmetric glandular tissue on recent CT chest evaluation. The patient states she noticed asymmetrically increased size of the right breast approximately 3-4 months ago. Additionally, she has in

## 2022-04-07 ENCOUNTER — Emergency Department (HOSPITAL_COMMUNITY)
Admission: EM | Admit: 2022-04-07 | Discharge: 2022-04-07 | Discharge status: Against Medical Advice | Payer: Medicaid Other | Attending: Emergency Medicine | Admitting: Emergency Medicine

## 2022-04-07 ENCOUNTER — Encounter (HOSPITAL_COMMUNITY): Payer: Self-pay

## 2022-04-07 DIAGNOSIS — N644 Mastodynia: Secondary | ICD-10-CM | POA: Insufficient documentation

## 2022-04-07 DIAGNOSIS — Z5321 Procedure and treatment not carried out due to patient leaving prior to being seen by health care provider: Secondary | ICD-10-CM | POA: Diagnosis not present

## 2022-04-07 NOTE — ED Triage Notes (Addendum)
Patient is currently breast feeding. Patient presents with redness and noticed changes in her right areola. Patient states the pain now wraps around her back. Patient was seen in September for possible inflammatory breast caner and has a follow up oncology appointment in October. Patient has been unable to transition her daughter off of breast milk at this time. No itching at this time.

## 2022-04-20 ENCOUNTER — Ambulatory Visit
Admission: RE | Admit: 2022-04-20 | Discharge: 2022-04-20 | Disposition: A | Discharge status: Home / Self Care | Payer: Medicaid Other | Source: Ambulatory Visit | Attending: Hematology and Oncology | Admitting: Hematology and Oncology

## 2022-04-20 ENCOUNTER — Other Ambulatory Visit: Payer: Self-pay | Admitting: Hematology and Oncology

## 2022-04-20 DIAGNOSIS — R9389 Abnormal findings on diagnostic imaging of other specified body structures: Secondary | ICD-10-CM

## 2022-04-21 ENCOUNTER — Other Ambulatory Visit: Payer: Self-pay | Admitting: Hematology and Oncology

## 2022-04-21 DIAGNOSIS — N6489 Other specified disorders of breast: Secondary | ICD-10-CM

## 2022-04-24 ENCOUNTER — Other Ambulatory Visit: Payer: Self-pay | Admitting: *Deleted

## 2022-04-24 DIAGNOSIS — Z5321 Procedure and treatment not carried out due to patient leaving prior to being seen by health care provider: Secondary | ICD-10-CM | POA: Insufficient documentation

## 2022-04-24 DIAGNOSIS — N898 Other specified noninflammatory disorders of vagina: Secondary | ICD-10-CM | POA: Insufficient documentation

## 2022-04-24 IMAGING — CT CT ANGIO CHEST
2 of 6 series · 18 of 36 positions shown · IV contrast (agent unspecified)
Comparison: 02/26/2021

CLINICAL DATA: Shortness of breath.

EXAM:
CT ANGIOGRAPHY CHEST WITH CONTRAST
TECHNIQUE: Multidetector CT imaging of the chest was performed using the
standard protocol during bolus administration of intravenous
contrast. Multiplanar CT image reconstructions and MIPs were
obtained to evaluate the vascular anatomy.

[Series 5: thins · axial · 0.68mm/px · z∈[-241,-17]mm · 17 of 252 slices shown]
[im 14/252  lung]
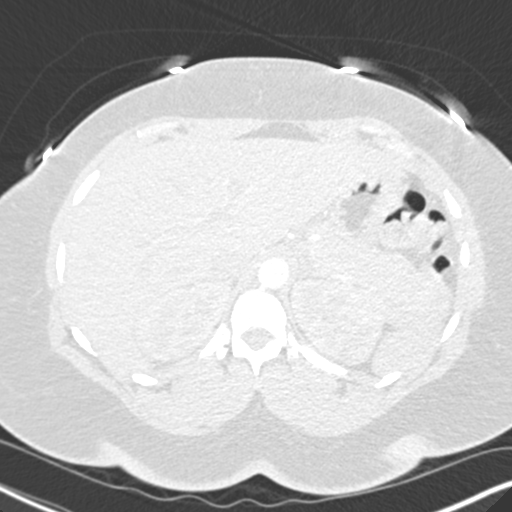
[im 28/252  mediastinal]
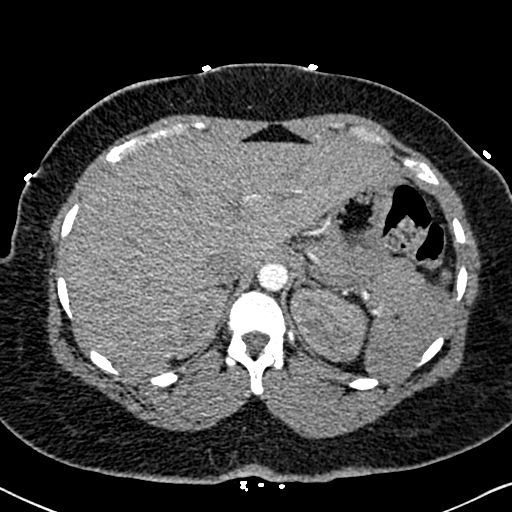
[im 42/252  lung]
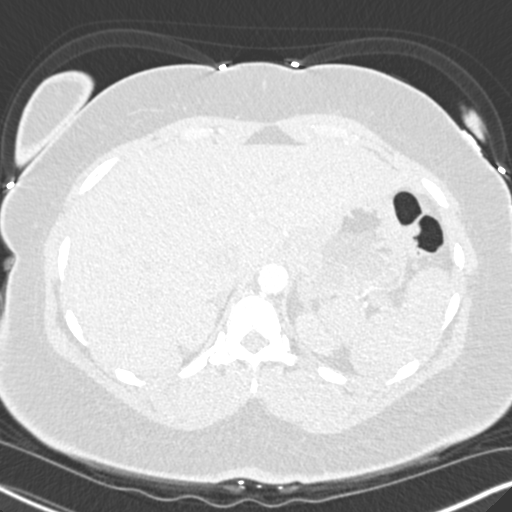
[im 56/252  mediastinal]
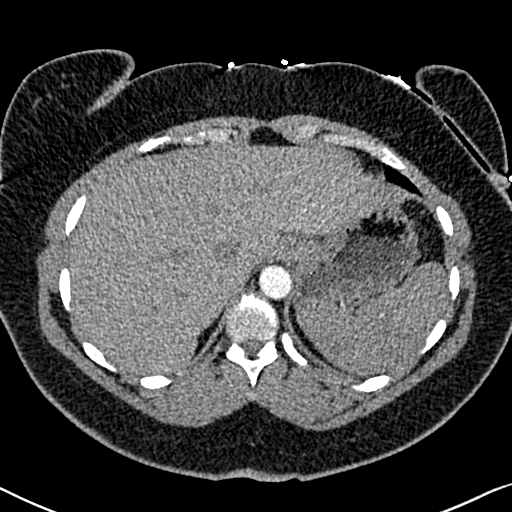
[im 70/252  lung]
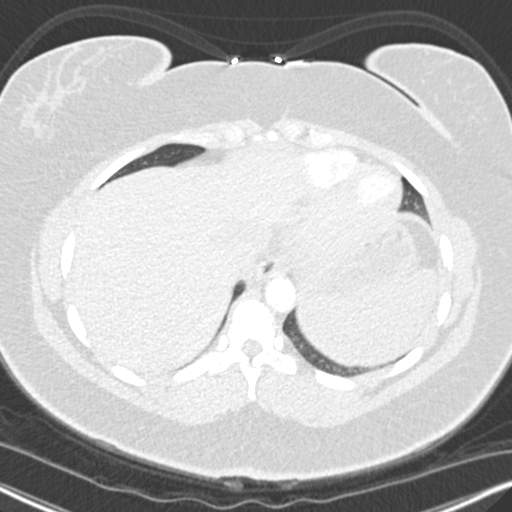
[im 84/252  mediastinal]
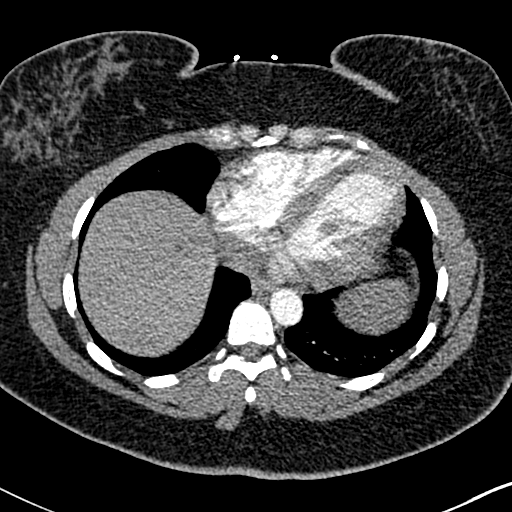
[im 98/252  lung]
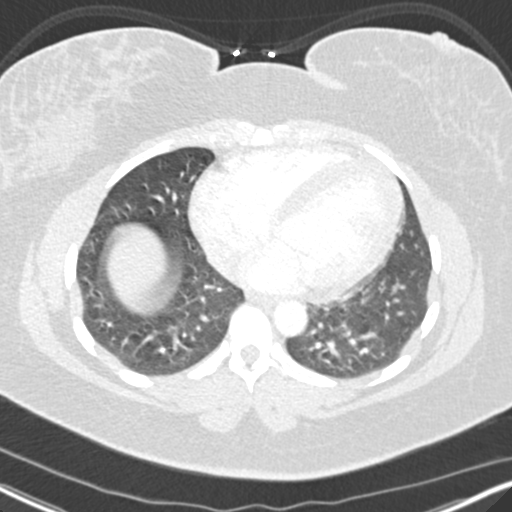
[im 112/252  mediastinal]
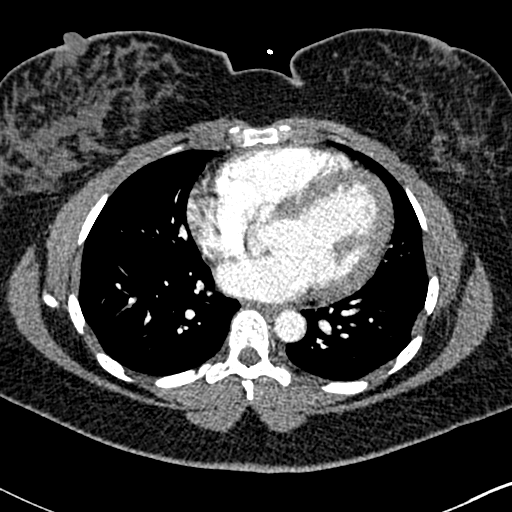
[im 126/252  lung]
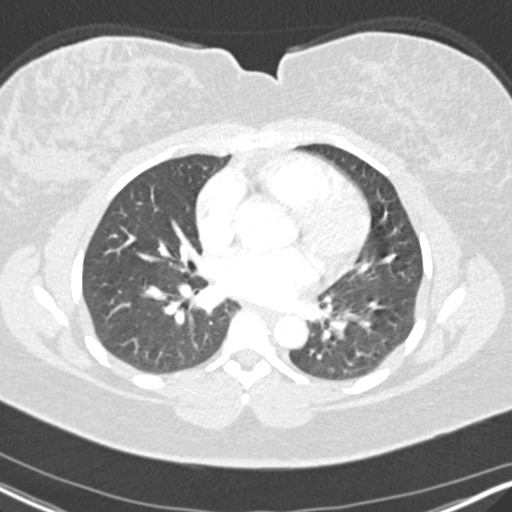
[im 140/252  mediastinal]
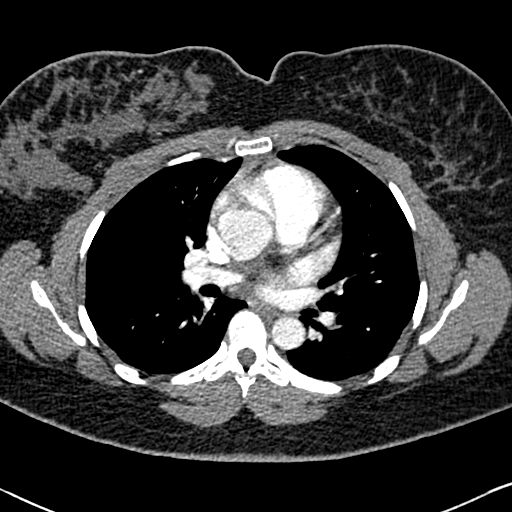
[im 154/252  lung]
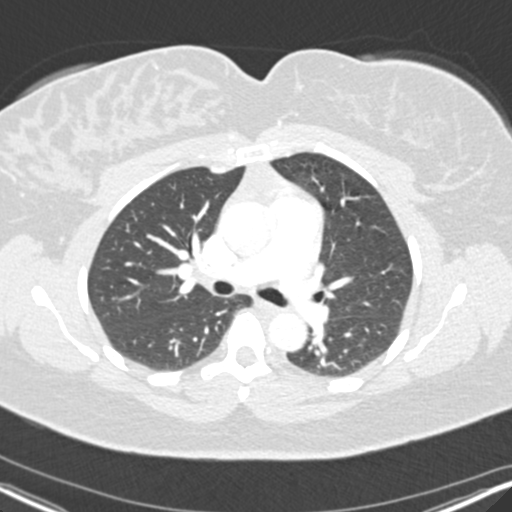
[im 168/252  mediastinal]
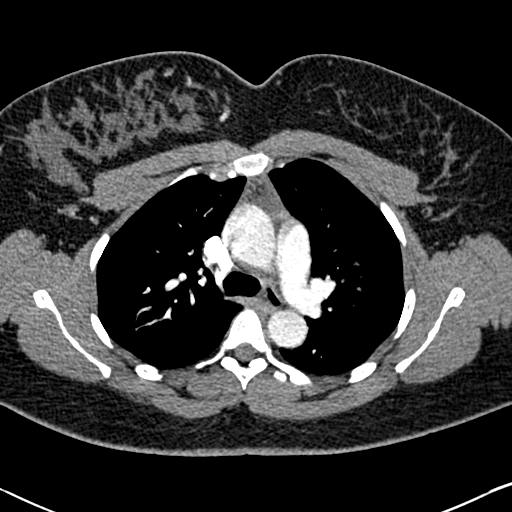
[im 182/252  lung]
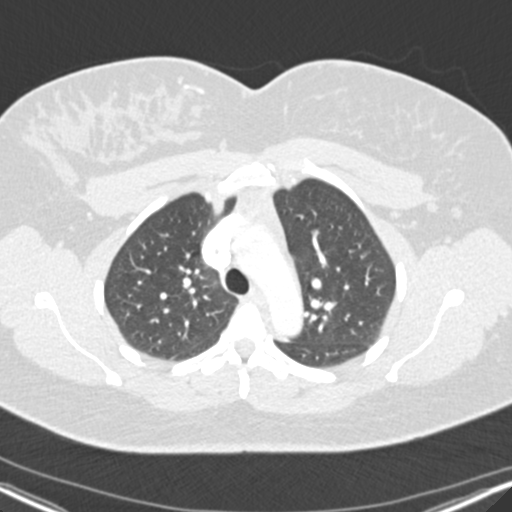
[im 196/252  mediastinal]
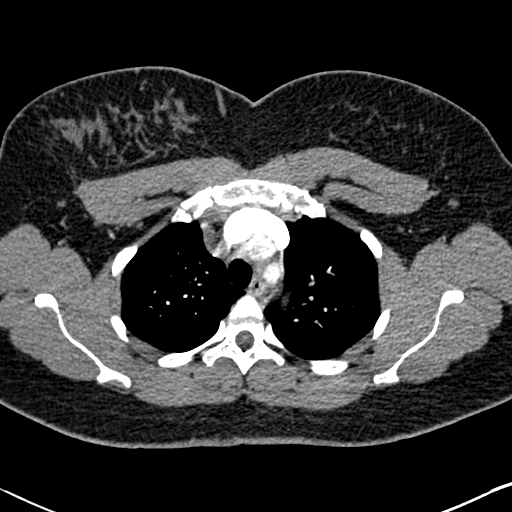
[im 210/252  lung]
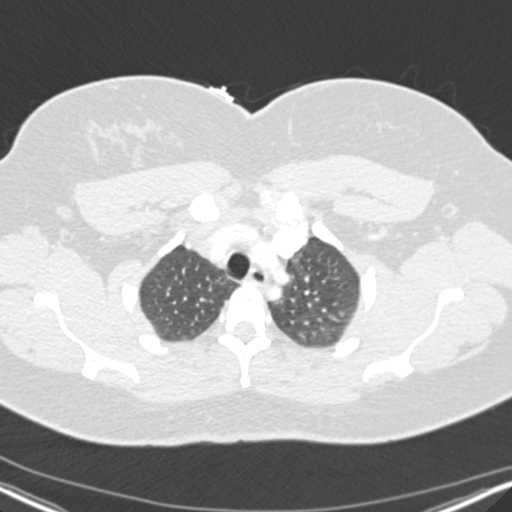
[im 224/252  mediastinal]
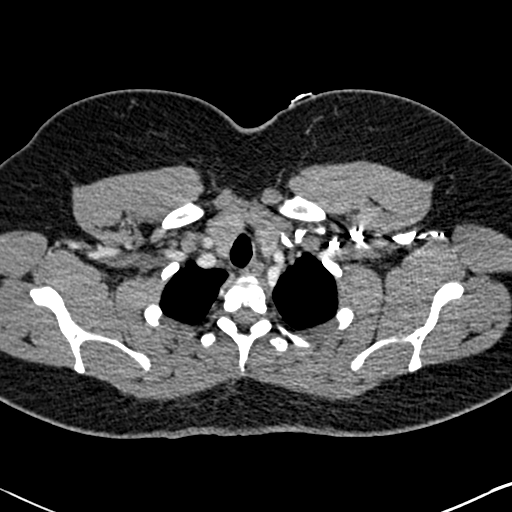
[im 238/252  lung]
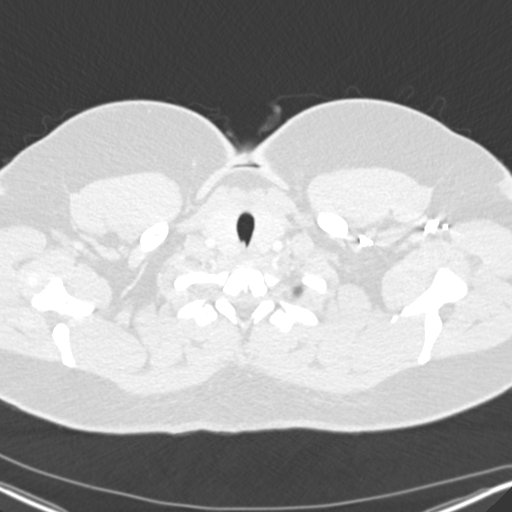

[Series 6: coronal mpr · coronal · 0.54mm/px · 1 of 139 slices shown]
[im 70/139  mediastinal]
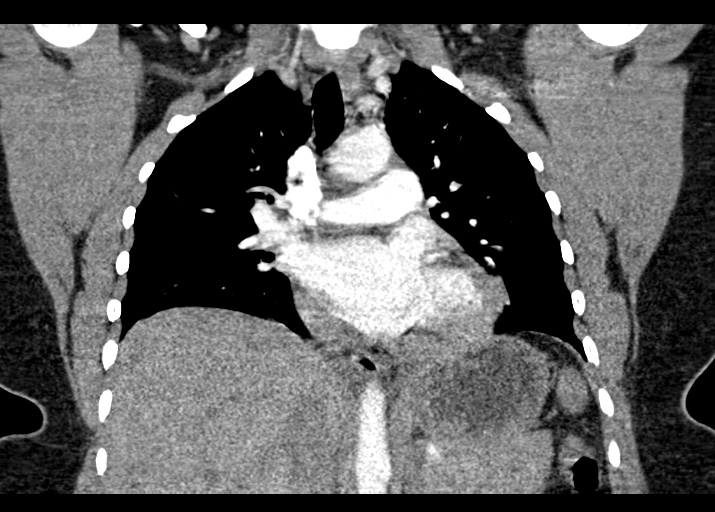

[18 of 36 positions shown; findings below may reference images not displayed]

RADIATION DOSE REDUCTION: This exam was performed according to the
departmental dose-optimization program which includes automated
exposure control, adjustment of the mA and/or kV according to
patient size and/or use of iterative reconstruction technique.

CONTRAST:  75mL OMNIPAQUE IOHEXOL 350 MG/ML SOLN
FINDINGS: Cardiovascular: The heart is normal in size. No pericardial
effusion. The aorta is normal in caliber. No dissection or
atherosclerotic calcification. The branch vessels are patent. The
pulmonary arterial tree is well opacified. No filling defects to
suggest pulmonary embolism.

Mediastinum/Nodes: No mediastinal or hilar mass or lymphadenopathy.
Stable residual thymic tissue noted in the anterior mediastinum.

Lungs/Pleura: No acute pulmonary findings. No infiltrates, edema or
effusions. No worrisome pulmonary lesions or pulmonary nodules.

Upper Abdomen: No significant upper abdominal findings.

Musculoskeletal: Diffuse asymmetric density in the right breast
compared to the left. Recommend clinical correlation. No
supraclavicular or axillary adenopathy. The bony thorax is intact.

Review of the MIP images confirms the above findings.
IMPRESSION: 1. No CT findings for pulmonary embolism.
2. Normal thoracic aorta.
3. No acute pulmonary findings.
4. Diffuse asymmetric density in the right breast compared to the
left. Possible inflammatory process. Recommend clinical correlation.

## 2022-04-24 NOTE — ED Triage Notes (Signed)
Pt presents via POV with complaints of vaginal itching and discharge for the last last week. She has been treating it was monistat and was unsuccessful - recent treatment with an antibiotic for mastitis. Denies fevers, chills, N/V, CP or SOB.

## 2022-04-25 ENCOUNTER — Encounter: Payer: Self-pay | Admitting: Emergency Medicine

## 2022-04-25 ENCOUNTER — Inpatient Hospital Stay (HOSPITAL_COMMUNITY)
Admission: AD | Admit: 2022-04-25 | Discharge: 2022-04-25 | Disposition: A | Discharge status: Home / Self Care | Payer: Medicaid Other | Attending: Obstetrics & Gynecology | Admitting: Obstetrics & Gynecology

## 2022-04-25 ENCOUNTER — Emergency Department
Admission: EM | Admit: 2022-04-25 | Discharge: 2022-04-25 | Discharge status: Against Medical Advice | Payer: Medicaid Other | Attending: Emergency Medicine | Admitting: Emergency Medicine

## 2022-04-25 ENCOUNTER — Telehealth: Payer: Self-pay | Admitting: Advanced Practice Midwife

## 2022-04-25 ENCOUNTER — Encounter (HOSPITAL_COMMUNITY): Payer: Self-pay | Admitting: Obstetrics & Gynecology

## 2022-04-25 DIAGNOSIS — N898 Other specified noninflammatory disorders of vagina: Secondary | ICD-10-CM

## 2022-04-25 DIAGNOSIS — Z3A01 Less than 8 weeks gestation of pregnancy: Secondary | ICD-10-CM

## 2022-04-25 DIAGNOSIS — L298 Other pruritus: Secondary | ICD-10-CM | POA: Insufficient documentation

## 2022-04-25 DIAGNOSIS — Z1389 Encounter for screening for other disorder: Secondary | ICD-10-CM | POA: Diagnosis not present

## 2022-04-25 LAB — URINALYSIS, ROUTINE W REFLEX MICROSCOPIC
Bilirubin Urine: NEGATIVE
Glucose, UA: NEGATIVE mg/dL
Hgb urine dipstick: NEGATIVE
Ketones, ur: NEGATIVE mg/dL
Nitrite: NEGATIVE
Protein, ur: 30 mg/dL — AB
Specific Gravity, Urine: 1.025 (ref 1.005–1.030)
pH: 5 (ref 5.0–8.0)

## 2022-04-25 LAB — WET PREP, GENITAL
Clue Cells Wet Prep HPF POC: NONE SEEN
Sperm: NONE SEEN
Trich, Wet Prep: NONE SEEN
WBC, Wet Prep HPF POC: >=10 — AB (ref <10)
Yeast Wet Prep HPF POC: NONE SEEN

## 2022-04-25 LAB — POC URINE PREG, ED: Preg Test, Ur: POSITIVE — AB

## 2022-04-25 NOTE — Telephone Encounter (Signed)
Patient reached out to [employer], who reached out to MAU RN station. CNM called patient at home. Identity confirmed. Patient requests that her results from self swab and UA be reviewed. CNM conducted full review of all results per patient request. Patient denies questions at end of call.  Mallie Snooks, MSA, MSN, CNM Certified Nurse Midwife, [facility] for [employer], [place] 04/25/22 8:52 AM

## 2022-04-25 NOTE — ED Notes (Signed)
Pt seen leaving the ED- unsure if the patient will be returning to the lobby.

## 2022-04-25 NOTE — MAU Note (Signed)
Pt says she took HPT - Monday- positive  Was at [facility] tonight - then came here  Says was on antx last week  from Urgent  care - for mastitis  Vag was dry and painful- used Monistat on Monday x3 days  After used- itching and soreness came back

## 2022-04-25 NOTE — MAU Provider Note (Signed)
Event Date/Time   First Provider Initiated Contact with Patient 04/25/22 [phone]      S [name] is a 36 y.o. [id number] patient who presents to MAU today with complaint of vaginal itching.  Patient states she was recently treated for suspected Mastitis and noted vaginal symptoms after completion of her antibiotics.  Patient states she completed a 3 day course of Monistat, but symptoms remain.  She reports some burning with urination, but only when the urine comes in contact with the skin.  She also reports some mild (2/10) abdominal pain that is intermittent and not currently present.   O BP 131/71 (BP Location: Right Arm)   Pulse 92   Temp 98.2 F (36.8 C) (Oral)   Resp 20   Ht 5\' 4"  (1.626 m)   Wt 101.8 kg   LMP 03/15/2022   BMI 38.54 kg/m  Physical Exam Vitals reviewed. Exam conducted with a chaperone present.  Constitutional:      Appearance: Normal appearance.  HENT:     Head: Normocephalic and atraumatic.  Eyes:     Conjunctiva/sclera: Conjunctivae normal.  Cardiovascular:     Rate and Rhythm: Normal rate.  Pulmonary:     Effort: Pulmonary effort is normal. No respiratory distress.  Musculoskeletal:        General: Normal range of motion.  Neurological:     Mental Status: She is alert and oriented to person, place, and time.  Psychiatric:        Mood and Affect: Mood normal.        Behavior: Behavior normal.    A Medical screening exam  Vaginal Itching   P -Discussed collection of self-swab. -Reviewed sending results and treatment via mychart. -Patient agreeable and without questions. -Orders placed.  -Discharge from MAU in stable condition -Warning signs for worsening condition that would warrant emergency follow-up discussed Patient may return to MAU as needed   Gavin Pound, [place] 04/25/2022 4:42 AM

## 2022-04-27 LAB — GC/CHLAMYDIA PROBE AMP (~~LOC~~) NOT AT ARMC
Chlamydia: NEGATIVE
Comment: NEGATIVE
Comment: NORMAL
Neisseria Gonorrhea: NEGATIVE

## 2022-06-18 LAB — OB RESULTS CONSOLE HIV ANTIBODY (ROUTINE TESTING): HIV: NONREACTIVE

## 2022-06-18 LAB — OB RESULTS CONSOLE RPR: RPR: NONREACTIVE

## 2022-06-18 LAB — OB RESULTS CONSOLE GBS: GBS: POSITIVE

## 2022-06-18 LAB — OB RESULTS CONSOLE ANTIBODY SCREEN: Antibody Screen: NEGATIVE

## 2022-06-18 LAB — OB RESULTS CONSOLE RUBELLA ANTIBODY, IGM: Rubella: IMMUNE

## 2022-06-18 LAB — HEPATITIS C ANTIBODY: HCV Ab: NEGATIVE

## 2022-06-18 LAB — OB RESULTS CONSOLE ABO/RH: RH Type: POSITIVE

## 2022-06-18 LAB — OB RESULTS CONSOLE HEPATITIS B SURFACE ANTIGEN: Hepatitis B Surface Ag: NEGATIVE

## 2022-06-23 ENCOUNTER — Inpatient Hospital Stay (HOSPITAL_BASED_OUTPATIENT_CLINIC_OR_DEPARTMENT_OTHER): Payer: Medicaid Other

## 2022-06-23 ENCOUNTER — Encounter (HOSPITAL_COMMUNITY): Payer: Self-pay | Admitting: Obstetrics & Gynecology

## 2022-06-23 ENCOUNTER — Inpatient Hospital Stay (HOSPITAL_COMMUNITY)
Admission: AD | Admit: 2022-06-23 | Discharge: 2022-06-23 | Disposition: A | Discharge status: Home / Self Care | Payer: Medicaid Other | Attending: Obstetrics & Gynecology | Admitting: Obstetrics & Gynecology

## 2022-06-23 ENCOUNTER — Inpatient Hospital Stay (EMERGENCY_DEPARTMENT_HOSPITAL)
Admission: AD | Admit: 2022-06-23 | Discharge: 2022-06-23 | Disposition: A | Discharge status: Home / Self Care | Payer: Medicaid Other | Source: Home / Self Care | Attending: Obstetrics & Gynecology | Admitting: Obstetrics & Gynecology

## 2022-06-23 DIAGNOSIS — Z3A14 14 weeks gestation of pregnancy: Secondary | ICD-10-CM

## 2022-06-23 DIAGNOSIS — O09522 Supervision of elderly multigravida, second trimester: Secondary | ICD-10-CM | POA: Insufficient documentation

## 2022-06-23 DIAGNOSIS — Z3492 Encounter for supervision of normal pregnancy, unspecified, second trimester: Secondary | ICD-10-CM

## 2022-06-23 DIAGNOSIS — N898 Other specified noninflammatory disorders of vagina: Secondary | ICD-10-CM

## 2022-06-23 DIAGNOSIS — O26892 Other specified pregnancy related conditions, second trimester: Secondary | ICD-10-CM | POA: Insufficient documentation

## 2022-06-23 DIAGNOSIS — O09292 Supervision of pregnancy with other poor reproductive or obstetric history, second trimester: Secondary | ICD-10-CM | POA: Diagnosis present

## 2022-06-23 DIAGNOSIS — O4192X0 Disorder of amniotic fluid and membranes, unspecified, second trimester, not applicable or unspecified: Secondary | ICD-10-CM | POA: Diagnosis not present

## 2022-06-23 LAB — WET PREP, GENITAL
Clue Cells Wet Prep HPF POC: NONE SEEN
Sperm: NONE SEEN
Trich, Wet Prep: NONE SEEN
WBC, Wet Prep HPF POC: >=10 — AB (ref <10)
Yeast Wet Prep HPF POC: NONE SEEN

## 2022-06-23 LAB — AMNISURE RUPTURE OF MEMBRANE (ROM) NOT AT ARMC: Amnisure ROM: NEGATIVE

## 2022-06-23 NOTE — MAU Note (Signed)
.  Nicole Landry is a 36 y.o. at 106w2d here in MAU reporting: leaking of clear, watery fluid after getting out of the shower this afternoon. She cleaned up and got in the bed and felt more leaking. She hasn't felt any leaking since getting out of the bed. She is not wearing a pad or panti-liner. Last intercourse was last night. Denies VB or pain. LMP: N/A Onset of complaint: Today Pain score: 0/10 Vitals:   06/23/22 1535  BP: 128/65  Pulse: 97  Resp: 16  Temp: 98.6 F (37 C)  SpO2: 100%     FHT:145 Lab orders placed from triage:  UA

## 2022-06-23 NOTE — MAU Provider Note (Addendum)
History     CSN: [id number]  Arrival date and time: 06/23/22 1516   Event Date/Time   First Provider Initiated Contact with Patient 06/23/22 1612      Chief Complaint  Patient presents with   Leaking of fluid   Nicole Landry , a  36 y.o. [id number] at [redacted]w[redacted]d presents to MAU  with complaints of leaking of fluid since this afternoon. Patient states she had just had a shower and felt a "trickle of fluid. Cleaned up and it was clear. She states she then went and sat down and felt a gush of fluid then as well. She denies leaking since and denies having to wear a pad. She describes fluid as watery. She states she did have intercourse last night, but states "this is not semen." She states that with her previous pregnancy when her water broke this happened the same way. She denies vaginal bleeding, and pain. She endorses occasional fetal movement.         OB History     Gravida  8   Para  5   Term  5   Preterm  0   AB  2   Living  5      SAB  1   IAB  0   Ectopic  1   Multiple  0   Live Births  5        Obstetric Comments  Pt states does not want a C/S for this Pregnancy.        Past Medical History:  Diagnosis Date   Blood transfusion without reported diagnosis    after ectopic   Ectopic pregnancy, tubal    Gestational diabetes mellitus 2011   denies gestational DM with current pregnancy   Ovarian cyst    Status post vacuum-assisted vaginal delivery (5/17) 11/20/2015   Past Surgical History:  Procedure Laterality Date   BILATERAL SALPINGECTOMY Right 02/08/2013   Procedure:  SALPINGECTOMY;  Surgeon: Antionette Char, MD;  Location: [facility];  Service: Gynecology;  Laterality: Right;   CESAREAN SECTION     CESAREAN SECTION N/A 08/18/2017   Procedure: Repeat CESAREAN SECTION;  Surgeon: Shea Evans, MD;  Location: [facility];  Service: Obstetrics;  Laterality: N/A;  EDD: 08/24/17 Allergy: Vicodin   CESAREAN SECTION N/A 04/12/2019   Procedure:  Repeat CESAREAN SECTION;  Surgeon: Shea Evans, MD;  Location: [facility] ORS;  Service: Obstetrics;  Laterality: N/A;  EDD: 04/17/19 Allergy: Vicodin   CESAREAN SECTION N/A 02/21/2021   Procedure: CESAREAN SECTION;  Surgeon: Myna Hidalgo, DO;  Location: [facility];  Service: Obstetrics;  Laterality: N/A;   DILATION AND CURETTAGE OF UTERUS     LAPAROSCOPY N/A 02/08/2013   Procedure: LAPAROSCOPY OPERATIVE;  Surgeon: Antionette Char, MD;  Location: [facility];  Service: Gynecology;  Laterality: N/A;   SALPINGECTOMY Right 02/2013   op note in epic    Family History  Problem Relation Age of Onset   Cancer Maternal Grandmother        unknown cancer in her 71s   Asthma Neg Hx    Depression Neg Hx    Diabetes Neg Hx    Heart disease Neg Hx    Hypertension Neg Hx    Obesity Neg Hx    Miscarriages / Stillbirths Neg Hx    Social History   Tobacco Use   Smoking status: Never   Smokeless tobacco: Never  Vaping Use   Vaping Use: Never used  Substance Use Topics  Alcohol use: No   Drug use: No   Allergies:  Allergies  Allergen Reactions   Food Anaphylaxis and Other (See Comments)    Pt is allergic to Cantalope   Vicodin [Hydrocodone-Acetaminophen] Other (See Comments)    Reaction:  Seizures; has tolerated acetaminophen and other narcotics in the past   Medications Prior to Admission  Medication Sig Dispense Refill Last Dose   Prenatal Vit-Fe Fumarate-FA (PRENATAL MULTIVITAMIN) TABS tablet Take 1 tablet by mouth daily at 12 noon.   Past Month   ALPRAZolam (XANAX) 0.25 MG tablet Take 1-2 tabs (0.25mg -0.50mg ) 30-60 minutes before procedure. May repeat if needed.Do not drive.May cause sedation 6 tablet 0    enoxaparin (LOVENOX) 100 MG/ML injection Inject 1 mL (100 mg total) into the skin every 12 (twelve) hours. 180 mL 0    Review of Systems  Constitutional:  Negative for chills, fatigue and fever.  Eyes:  Negative for pain and visual disturbance.  Respiratory:  Negative for apnea,  shortness of breath and wheezing.   Cardiovascular:  Negative for chest pain and palpitations.  Gastrointestinal:  Negative for abdominal pain, constipation, diarrhea, nausea and vomiting.  Genitourinary:  Positive for vaginal discharge. Negative for difficulty urinating, dysuria, pelvic pain, vaginal bleeding and vaginal pain.  Musculoskeletal:  Negative for back pain.  Neurological:  Negative for seizures, weakness and headaches.  Psychiatric/Behavioral:  Negative for suicidal ideas.    Physical Exam   Blood pressure (!) 113/58, pulse 89, temperature 98.6 F (37 C), temperature source Oral, resp. rate 16, height 5\' 3"  (1.6 m), weight 107.7 kg, last menstrual period 03/15/2022, SpO2 100 %, currently breastfeeding.  Physical Exam Vitals and nursing note reviewed. Exam conducted with a chaperone present.  Constitutional:      General: She is not in acute distress.    Appearance: Normal appearance.  HENT:     Head: Normocephalic.  Pulmonary:     Effort: Pulmonary effort is normal.  Genitourinary:    General: Normal vulva.     Vagina: Normal.     Comments: Clear fluid observed pooling into the speculum. Wet prep and fern collected.  Musculoskeletal:        General: Normal range of motion.     Cervical back: Normal range of motion.  Skin:    General: Skin is warm and dry.     Capillary Refill: Capillary refill takes less than 2 seconds.  Neurological:     Mental Status: She is alert and oriented to person, place, and time.  Psychiatric:        Mood and Affect: Mood normal.    FHT obtained in triage.   MAU Course,  MDM: Moderate Speculum exam completed by Gailen Shelter, CNM - small amount of pooling noted. Fern slide inconclusive (moderate amount of lactobacilli but also small crosses that resembled urine), despite being examined multiple times by both CNMs over the course of an hour. Sent to U/S which found a normal amount of fluid for gestational age. Wet prep showed only WBCs,  otherwise normal. Amnisure not initially collected due to very recent intercourse.  Discussed results with patient and gave very strong precautions for PPROM but explained the loss of fluid likely mixture of seminal and cervical fluid. Patient discharged in stable condition.  Just after patient exited the unit, RN noted ferning on the slide. Confirmed by both midwives. Discussed presence of ferning with both Drs. Peggy Constant and Waymon Amato who questioned if it could be cervical mucus vs amniotic fluid, but  advised pt be given very strong PPROM precautions with close follow up.  Called patient to discuss new finding, she opted to return to MAU now to repeat ferning and get Amnisure (company site confirmed it is not affected by recent IC, seminal fluid, blood, etc).  Assessment and Plan  Possible PPROM Fetal heart tones present Pt initially discharged but returning to MAU now to repeat testing.  Edd Arbour, CNM, MSN, IBCLC Certified Nurse Midwife, [facility]

## 2022-06-23 NOTE — MAU Provider Note (Signed)
Event Date/Time   First Provider Initiated Contact with Patient 06/23/22 2051     S [name] is a 36 y.o. [id number] pregnant female at [redacted]w[redacted]d who presents to MAU today to repeat her fern slide and get an Amnisure done. She reports still feeling "wet" but no additional gushes of fluid, no cramping or bleeding.  Receives care at [facility]. Prenatal records reviewed.  Pertinent items noted in HPI and remainder of comprehensive ROS otherwise negative.   O BP 119/62 (BP Location: Right Arm)   Pulse (!) 103   Resp 18   LMP 03/15/2022   SpO2 100%  Physical Exam Vitals and nursing note reviewed.  Constitutional:      Appearance: Normal appearance.  HENT:     Head: Normocephalic.  Cardiovascular:     Rate and Rhythm: Normal rate and regular rhythm.  Pulmonary:     Effort: Pulmonary effort is normal.  Abdominal:     Palpations: Abdomen is soft.  Genitourinary:    General: Normal vulva.     Vagina: Vaginal discharge (scant pooling) present.  Musculoskeletal:        General: Normal range of motion.  Skin:    General: Skin is warm.     Capillary Refill: Capillary refill takes less than 2 seconds.  Neurological:     Mental Status: She is alert and oriented to person, place, and time.  Psychiatric:        Mood and Affect: Mood normal.        Behavior: Behavior normal.        Thought Content: Thought content normal.        Judgment: Judgment normal.     MDM:Low  MAU Course: Scant pooling noted on fern slide. Many lactobacilli but no ferning noted once left to dry. Amnisure negative. Pt given precautions for PPROM given pooling from earlier, knows to come back with leaking of fluid or any signs of infection, but can follow up as scheduled at [facility].  A Amniotic membranes intact [redacted] weeks gestation of pregnancy  P Discharge from MAU in stable condition with PPROM precautions Follow up at [facility] as scheduled for ongoing prenatal care  Allergies as of  06/23/2022       Reactions   Food Anaphylaxis, Other (See Comments)   Pt is allergic to Cantalope   Vicodin [hydrocodone-acetaminophen] Other (See Comments)   Reaction:  Seizures; has tolerated acetaminophen and other narcotics in the past        Medication List     TAKE these medications    ALPRAZolam 0.25 MG tablet Commonly known as: Xanax Take 1-2 tabs (0.25mg -0.50mg ) 30-60 minutes before procedure. May repeat if needed.Do not drive.May cause sedation   enoxaparin 100 MG/ML injection Commonly known as: LOVENOX Inject 1 mL (100 mg total) into the skin every 12 (twelve) hours.   prenatal multivitamin Tabs tablet Take 1 tablet by mouth daily at 12 noon.        Bernerd Limbo, [place] 06/23/2022 9:58 PM

## 2022-06-24 ENCOUNTER — Telehealth: Payer: Self-pay | Admitting: Hematology and Oncology

## 2022-06-24 NOTE — Telephone Encounter (Signed)
Contacted patient to scheduled appointments. Left message with appointment details and a call back number if patient had any questions or could not accommodate the time we provided.   

## 2022-06-25 ENCOUNTER — Other Ambulatory Visit: Payer: Self-pay | Admitting: Obstetrics and Gynecology

## 2022-06-25 DIAGNOSIS — O0991 Supervision of high risk pregnancy, unspecified, first trimester: Secondary | ICD-10-CM

## 2022-06-25 DIAGNOSIS — Z363 Encounter for antenatal screening for malformations: Secondary | ICD-10-CM

## 2022-06-26 ENCOUNTER — Encounter: Payer: Self-pay | Admitting: *Deleted

## 2022-06-26 ENCOUNTER — Ambulatory Visit: Payer: Medicaid Other | Admitting: *Deleted

## 2022-06-26 ENCOUNTER — Other Ambulatory Visit: Payer: Self-pay | Admitting: *Deleted

## 2022-06-26 ENCOUNTER — Other Ambulatory Visit: Payer: Self-pay | Admitting: Obstetrics and Gynecology

## 2022-06-26 ENCOUNTER — Ambulatory Visit (HOSPITAL_BASED_OUTPATIENT_CLINIC_OR_DEPARTMENT_OTHER): Payer: Medicaid Other | Admitting: Maternal & Fetal Medicine

## 2022-06-26 ENCOUNTER — Ambulatory Visit: Payer: Medicaid Other | Attending: Obstetrics and Gynecology

## 2022-06-26 VITALS — BP 115/72 | HR 94

## 2022-06-26 DIAGNOSIS — E669 Obesity, unspecified: Secondary | ICD-10-CM

## 2022-06-26 DIAGNOSIS — Z3689 Encounter for other specified antenatal screening: Secondary | ICD-10-CM

## 2022-06-26 DIAGNOSIS — Z363 Encounter for antenatal screening for malformations: Secondary | ICD-10-CM

## 2022-06-26 DIAGNOSIS — O0991 Supervision of high risk pregnancy, unspecified, first trimester: Secondary | ICD-10-CM | POA: Diagnosis present

## 2022-06-26 DIAGNOSIS — O99212 Obesity complicating pregnancy, second trimester: Secondary | ICD-10-CM

## 2022-06-26 DIAGNOSIS — Z789 Other specified health status: Secondary | ICD-10-CM | POA: Insufficient documentation

## 2022-06-26 DIAGNOSIS — O10012 Pre-existing essential hypertension complicating pregnancy, second trimester: Secondary | ICD-10-CM

## 2022-06-26 DIAGNOSIS — Z3A14 14 weeks gestation of pregnancy: Secondary | ICD-10-CM

## 2022-06-26 DIAGNOSIS — O34219 Maternal care for unspecified type scar from previous cesarean delivery: Secondary | ICD-10-CM | POA: Diagnosis not present

## 2022-06-26 DIAGNOSIS — Z86711 Personal history of pulmonary embolism: Secondary | ICD-10-CM

## 2022-06-26 DIAGNOSIS — O09522 Supervision of elderly multigravida, second trimester: Secondary | ICD-10-CM

## 2022-06-26 DIAGNOSIS — O10912 Unspecified pre-existing hypertension complicating pregnancy, second trimester: Secondary | ICD-10-CM

## 2022-06-26 DIAGNOSIS — O99891 Other specified diseases and conditions complicating pregnancy: Secondary | ICD-10-CM

## 2022-06-26 NOTE — Progress Notes (Addendum)
MFM Consult Note Patient Name: Nicole Landry  Patient MRN:   [id number]  Referring provider: [place]  Reason for Consult: Hx of PE   HPI: Nicole Landry is a 36 y.o. [id number] at [redacted]w[redacted]d  here for ultrasound and consultation.   The patient reports she has a history of a pulmonary embolism that occurred after her cesarean section in a prior pregnancy.  She reports that she was discharged on hospital day 3 but then had to return due to shortness of breath and was diagnosed with pulmonary embolism.  She completed a number of months of anticoagulation and tolerated it well.  I discussed that it seems like she had an estrogen related thromboembolic event in the setting of major surgery and immobility.  In this clinical scenario we have multiple options ranging from prophylactic anticoagulation to therapeutic dosing.  I discussed that since she has multiple risk factors for a blood clot including a history of a prior blood clot, obesity, hypertension and pregnancy that intermediate dosing would likely be the best balance between medication exposure, risk of bleeding and prevention of further blood clots.  Currently she feels well and denies chest pain or shortness of breath.  Review of Systems: A review of systems was performed and was negative except per HPI   Vitals and Physical Exam See intake sheet for vitals Sitting comfortably on the sonogram table Nonlabored breathing Normal rate and rhythm Abdomen is nontender  Ultrasound findings  Single intrauterine pregnancy at [facility] with EDD of 12/20/2022 dated by LMP  (03/15/22). Observed fetal cardiac activity. Limited early fetal anatomy appears normal There are no gross fetal abnormalities although this is limited by gestational age. The placenta is anterior but there is no evidence of abnormal lacunae, vision vessels, loss of retroplacental clear space or other signs of placenta accreta.  Assessment -Hx of PE -Prior cesarean delivery x  4 -Chronic hypertension -Obesity -Maternal age Plan -She has already had a negative thrombophilia (inherited and acquired) workup -Based on our discussion I suggest intermediate dosing of Lovenox 0.5 mg/kg to start now throughout her pregnancy and to continue at least 6 weeks postpartum.  With major bleeding events or delivery will have to hold her medication. -Aspirin 81 mg for preeclampsia prophylaxis -Limit weight gain to 11 to 20 pounds during pregnancy -Aneuploidy screening is completed and is normal -Return around 18 to 19 weeks for detailed anatomic survey.  Also continue to assess the placenta given her history of 4 prior cesarean deliveries. -Serial growth ultrasound every 4 weeks from 24 to 28 weeks until delivery -Delivery timing pending her clinical course but likely around 37 to 39 weeks  I spent 45 minutes reviewing the patients chart, including labs and images as well as counseling the patient about her medical conditions.  [name]  MFM, [place]   06/26/2022  4:04 PM

## 2022-07-07 ENCOUNTER — Inpatient Hospital Stay: Payer: Medicaid Other

## 2022-07-07 ENCOUNTER — Other Ambulatory Visit: Payer: Self-pay

## 2022-07-07 ENCOUNTER — Inpatient Hospital Stay: Payer: Medicaid Other | Attending: Adult Health | Admitting: Adult Health

## 2022-07-07 VITALS — BP 108/67 | HR 93 | Temp 97.7°F | Resp 18 | Ht 63.0 in | Wt 238.2 lb

## 2022-07-07 DIAGNOSIS — O99012 Anemia complicating pregnancy, second trimester: Secondary | ICD-10-CM | POA: Diagnosis present

## 2022-07-07 DIAGNOSIS — Z79899 Other long term (current) drug therapy: Secondary | ICD-10-CM | POA: Diagnosis not present

## 2022-07-07 DIAGNOSIS — Z86711 Personal history of pulmonary embolism: Secondary | ICD-10-CM | POA: Diagnosis not present

## 2022-07-07 DIAGNOSIS — I2699 Other pulmonary embolism without acute cor pulmonale: Secondary | ICD-10-CM | POA: Diagnosis not present

## 2022-07-07 DIAGNOSIS — E538 Deficiency of other specified B group vitamins: Secondary | ICD-10-CM

## 2022-07-07 DIAGNOSIS — D509 Iron deficiency anemia, unspecified: Secondary | ICD-10-CM | POA: Insufficient documentation

## 2022-07-07 DIAGNOSIS — Z3A14 14 weeks gestation of pregnancy: Secondary | ICD-10-CM | POA: Diagnosis not present

## 2022-07-07 DIAGNOSIS — D508 Other iron deficiency anemias: Secondary | ICD-10-CM

## 2022-07-07 DIAGNOSIS — D649 Anemia, unspecified: Secondary | ICD-10-CM

## 2022-07-07 HISTORY — DX: Deficiency of other specified B group vitamins: E53.8

## 2022-07-07 LAB — CBC WITH DIFFERENTIAL (CANCER CENTER ONLY)
Abs Immature Granulocytes: 0.04 10*3/uL (ref 0.00–0.07)
Basophils Absolute: 0 10*3/uL (ref 0.0–0.1)
Basophils Relative: 0 %
Eosinophils Absolute: 0 10*3/uL (ref 0.0–0.5)
Eosinophils Relative: 0 %
HCT: 30.7 % — ABNORMAL LOW (ref 36.0–46.0)
Hemoglobin: 9.1 g/dL — ABNORMAL LOW (ref 12.0–15.0)
Immature Granulocytes: 0 %
Lymphocytes Relative: 16 %
Lymphs Abs: 1.4 10*3/uL (ref 0.7–4.0)
MCH: 21.4 pg — ABNORMAL LOW (ref 26.0–34.0)
MCHC: 29.6 g/dL — ABNORMAL LOW (ref 30.0–36.0)
MCV: 72.1 fL — ABNORMAL LOW (ref 80.0–100.0)
Monocytes Absolute: 0.5 10*3/uL (ref 0.1–1.0)
Monocytes Relative: 6 %
Neutro Abs: 7 10*3/uL (ref 1.7–7.7)
Neutrophils Relative %: 78 %
Platelet Count: 431 10*3/uL — ABNORMAL HIGH (ref 150–400)
RBC: 4.26 MIL/uL (ref 3.87–5.11)
RDW: 20.4 % — ABNORMAL HIGH (ref 11.5–15.5)
WBC Count: 9 10*3/uL (ref 4.0–10.5)
nRBC: 0 % (ref 0.0–0.2)

## 2022-07-07 LAB — IRON AND IRON BINDING CAPACITY (CC-WL,HP ONLY)
Iron: 285 ug/dL — ABNORMAL HIGH (ref 28–170)
Saturation Ratios: 47 % — ABNORMAL HIGH (ref 10.4–31.8)
TIBC: 609 ug/dL — ABNORMAL HIGH (ref 250–450)
UIBC: 324 ug/dL (ref 148–442)

## 2022-07-07 LAB — CMP (CANCER CENTER ONLY)
ALT: 6 U/L (ref 0–44)
AST: 10 U/L — ABNORMAL LOW (ref 15–41)
Albumin: 3.9 g/dL (ref 3.5–5.0)
Alkaline Phosphatase: 44 U/L (ref 38–126)
Anion gap: 9 (ref 5–15)
BUN: 6 mg/dL (ref 6–20)
CO2: 25 mmol/L (ref 22–32)
Calcium: 10 mg/dL (ref 8.9–10.3)
Chloride: 103 mmol/L (ref 98–111)
Creatinine: 0.45 mg/dL (ref 0.44–1.00)
GFR, Estimated: >60 mL/min (ref >60)
Glucose, Bld: 74 mg/dL (ref 70–99)
Potassium: 3.6 mmol/L (ref 3.5–5.1)
Sodium: 137 mmol/L (ref 135–145)
Total Bilirubin: 0.3 mg/dL (ref 0.3–1.2)
Total Protein: 7.7 g/dL (ref 6.5–8.1)

## 2022-07-07 LAB — SAVE SMEAR(SSMR), FOR PROVIDER SLIDE REVIEW

## 2022-07-07 LAB — RETIC PANEL
Immature Retic Fract: 36.7 % — ABNORMAL HIGH (ref 2.3–15.9)
RBC.: 4.28 MIL/uL (ref 3.87–5.11)
Retic Count, Absolute: 111.3 10*3/uL (ref 19.0–186.0)
Retic Ct Pct: 2.6 % (ref 0.4–3.1)
Reticulocyte Hemoglobin: 23.4 pg — ABNORMAL LOW (ref >27.9)

## 2022-07-07 LAB — FOLATE: Folate: 21.9 ng/mL (ref >5.9)

## 2022-07-07 LAB — VITAMIN B12: Vitamin B-12: 172 pg/mL — ABNORMAL LOW (ref 180–914)

## 2022-07-07 NOTE — Progress Notes (Signed)
[place] [facility] Follow up:    Pcp, No No address on file   DIAGNOSIS:   SUMMARY OF HEMATOLOGIC HISTORY: Diagnosed with post partum pulmonary embolism in 02/2021 after C-Section on Lovenox x 6 months.   Hypercoagulable work up negative Positive pregnancy test on 04/25/2022--LMP 03/15/2022, EDD 12/20/2022 Began on Lovenox 0.5mg /kg/day beginning 06/26/2022 Referred from OB/GYN due to anemia.    CURRENT THERAPY: Lovenox  INTERVAL HISTORY: Nicole Landry 37 y.o. female returns for    Patient Active Problem List   Diagnosis Date Noted  . Encounter for postpartum visit 03/06/2021  . Carpal tunnel syndrome of left wrist 03/06/2021  . Neuralgia 03/06/2021  . Visit for wound check 03/06/2021  . PE (pulmonary thromboembolism) ([place]) 02/26/2021  . Postpartum hypertension 02/26/2021  . Chest pain 02/26/2021  . Shortness of breath 02/26/2021  . Obstetric pulmonary embolism, postpartum 02/26/2021  . Gestational diabetes mellitus (GDM) in third trimester 12/23/2020  . Obesity in pregnancy, antepartum 10/22/2020  . Abnormal findings on diagnostic imaging of other specified body structures 08/13/2014    is allergic to food and vicodin [hydrocodone-acetaminophen].  MEDICAL HISTORY: Past Medical History:  Diagnosis Date  . Blood transfusion without reported diagnosis    after ectopic  . Ectopic pregnancy, tubal   . Gestational diabetes mellitus 2011   denies gestational DM with current pregnancy  . Ovarian cyst   . Status post vacuum-assisted vaginal delivery (5/17) 11/20/2015    SURGICAL HISTORY: Past Surgical History:  Procedure Laterality Date  . BILATERAL SALPINGECTOMY Right 02/08/2013   Procedure:  SALPINGECTOMY;  Surgeon: Lahoma Crocker, MD;  Location: [facility];  Service: Gynecology;  Laterality: Right;  . CESAREAN SECTION    . CESAREAN SECTION N/A 08/18/2017   Procedure: Repeat CESAREAN SECTION;  Surgeon: Azucena Fallen, MD;  Location: [facility];   Service: Obstetrics;  Laterality: N/A;  EDD: 08/24/17 Allergy: Vicodin  . CESAREAN SECTION N/A 04/12/2019   Procedure: Repeat CESAREAN SECTION;  Surgeon: Azucena Fallen, MD;  Location: [facility];  Service: Obstetrics;  Laterality: N/A;  EDD: 04/17/19 Allergy: Vicodin  . CESAREAN SECTION N/A 02/21/2021   Procedure: CESAREAN SECTION;  Surgeon: Janyth Pupa, DO;  Location: [facility];  Service: Obstetrics;  Laterality: N/A;  . DILATION AND CURETTAGE OF UTERUS    . LAPAROSCOPY N/A 02/08/2013   Procedure: LAPAROSCOPY OPERATIVE;  Surgeon: Lahoma Crocker, MD;  Location: [facility];  Service: Gynecology;  Laterality: N/A;  . SALPINGECTOMY Right 02/2013   op note in epic    SOCIAL HISTORY: Social History   Socioeconomic History  . Marital status: Married    Spouse name: Not on file  . Number of children: Not on file  . Years of education: Not on file  . Highest education level: Not on file  Occupational History  . Not on file  Tobacco Use  . Smoking status: Never  . Smokeless tobacco: Never  Vaping Use  . Vaping Use: Never used  Substance and Sexual Activity  . Alcohol use: No  . Drug use: No  . Sexual activity: Yes    Birth control/protection: None  Other Topics Concern  . Not on file  Social History Narrative   ** Merged History Encounter **       Social Determinants of Health   Financial Resource Strain: Low Risk  (03/28/2019)   Overall Financial Resource Strain (CARDIA)   . Difficulty of Paying Living Expenses: Not hard at all  Food Insecurity: No Food Insecurity (03/06/2021)  Hunger Vital Sign   . Worried About Charity fundraiser in the Last Year: Never true   . Ran Out of Food in the Last Year: Never true  Transportation Needs: No Transportation Needs (03/06/2021)   PRAPARE - Transportation   . Lack of Transportation (Medical): No   . Lack of Transportation (Non-Medical): No  Physical Activity: Not on file  Stress: Not on file  Social Connections: Not on file  Intimate  Partner Violence: Not on file    FAMILY HISTORY: Family History  Problem Relation Age of Onset  . Cancer Maternal Grandmother        unknown cancer in her 47s  . Asthma Neg Hx   . Depression Neg Hx   . Diabetes Neg Hx   . Heart disease Neg Hx   . Hypertension Neg Hx   . Obesity Neg Hx   . Miscarriages / Stillbirths Neg Hx     Review of Systems - Oncology    PHYSICAL EXAMINATION  ECOG PERFORMANCE STATUS: {CHL ONC ECOG FA:[id number]}  Vitals:   07/07/22 1513  BP: 108/67  Pulse: 93  Resp: 18  Temp: 97.7 F (36.5 C)  SpO2: 100%    Physical Exam  LABORATORY DATA:  CBC    Component Value Date/Time   WBC 5.7 09/01/2021 1913   RBC 3.48 (L) 09/01/2021 1913   HGB 8.1 (L) 09/01/2021 1913   HGB 10.6 (L) 12/20/2020 0849   HCT 27.3 (L) 09/01/2021 1913   HCT 32.8 (L) 12/20/2020 0849   PLT 523 (H) 09/01/2021 1913   PLT 291 12/20/2020 0849   MCV 78.4 (L) 09/01/2021 1913   MCV 85 12/20/2020 0849   MCH 23.3 (L) 09/01/2021 1913   MCHC 29.7 (L) 09/01/2021 1913   RDW 16.7 (H) 09/01/2021 1913   RDW 13.5 12/20/2020 0849   LYMPHSABS 1.7 09/01/2021 1913   LYMPHSABS 1.2 09/17/2020 1452   MONOABS 0.3 09/01/2021 1913   EOSABS 0.1 09/01/2021 1913   EOSABS 0.0 09/17/2020 1452   BASOSABS 0.0 09/01/2021 1913   BASOSABS 0.0 09/17/2020 1452    CMP     Component Value Date/Time   NA 139 09/01/2021 1913   NA 137 10/22/2020 1134   K 4.0 09/01/2021 1913   CL 106 09/01/2021 1913   CO2 27 09/01/2021 1913   GLUCOSE 94 09/01/2021 1913   BUN 11 09/01/2021 1913   BUN 5 (L) 10/22/2020 1134   CREATININE 0.69 09/01/2021 1913   CALCIUM 9.0 09/01/2021 1913   PROT 7.8 09/01/2021 1913   PROT 6.7 10/22/2020 1134   ALBUMIN 4.4 09/01/2021 1913   ALBUMIN 3.9 10/22/2020 1134   AST 15 09/01/2021 1913   ALT 15 09/01/2021 1913   ALKPHOS 83 09/01/2021 1913   BILITOT <0.1 (L) 09/01/2021 1913   BILITOT <0.2 10/22/2020 1134   GFRNONAA >60 09/01/2021 1913   GFRAA >60 07/13/2019 2216        PENDING LABS:   RADIOGRAPHIC STUDIES:  No results found.   PATHOLOGY:     ASSESSMENT and THERAPY PLAN:   No problem-specific Assessment & Plan notes found for this encounter.   No orders of the defined types were placed in this encounter.   All questions were answered. The patient knows to call the clinic with any problems, questions or concerns. We can certainly see the patient much sooner if necessary. This note was electronically signed. Scot Dock, NP 07/07/2022

## 2022-07-08 ENCOUNTER — Encounter: Payer: Self-pay | Admitting: Adult Health

## 2022-07-08 ENCOUNTER — Telehealth: Payer: Self-pay

## 2022-07-08 DIAGNOSIS — D509 Iron deficiency anemia, unspecified: Secondary | ICD-10-CM | POA: Insufficient documentation

## 2022-07-08 HISTORY — DX: Iron deficiency anemia, unspecified: D50.9

## 2022-07-08 LAB — FERRITIN: Ferritin: 7 ng/mL — ABNORMAL LOW (ref 11–307)

## 2022-07-08 NOTE — Telephone Encounter (Signed)
-----   Message from Lindsey Cornetto Causey, NP sent at 07/08/2022 12:33 PM EST ----- Ferritin and b12 are low.  I recommend that she come in for weekly venofer x 5 and she can get b12 injection or take 1000mcg sublingual daily with recheck in a couple weeks.  If she has a lot of questions happy to do a phone visit with her.  If agreeable, please let me know and send schedule message, I will put in orders for venofer now, but will wait on b12   Thanks, LC ----- Message ----- From: Interface, Lab In [place] Sent: 07/07/2022   4:02 PM EST To: Lindsey Cornetto Causey, NP   

## 2022-07-08 NOTE — Telephone Encounter (Signed)
Attempted to call [facility] to discuss recommendations per NP. LVM for call back .

## 2022-07-08 NOTE — Assessment & Plan Note (Signed)
Nicole Landry is a 37 year old woman who is [redacted] weeks pregnant with her sixth pregnancy here today for follow-up and evaluation of her anemia.  I do not have any lab work to look at to better understand her anemia therefore we will draw labs today including a CBC with differential, CMP, reticulocyte panel, iron studies with ferritin, B12, and folate.  Should we find any deficiencies we will treat accordingly.  She has follow-up already scheduled with Dr. Chryl Heck on February 12.  I recommended that she continue the Lovenox as prescribed by maternal-fetal medicine.  We will contact her with the results.

## 2022-07-09 ENCOUNTER — Encounter: Payer: Self-pay | Admitting: Adult Health

## 2022-07-09 ENCOUNTER — Telehealth: Payer: Self-pay

## 2022-07-09 NOTE — Telephone Encounter (Signed)
-----   Message from Gardenia Phlegm, NP sent at 07/08/2022 12:33 PM EST ----- Ferritin and b12 are low.  I recommend that she come in for weekly venofer x 5 and she can get b12 injection or take 1088mcg sublingual daily with recheck in a couple weeks.  If she has a lot of questions happy to do a phone visit with her.  If agreeable, please let me know and send schedule message, I will put in orders for venofer now, but will wait on b12   Thanks, LC ----- Message ----- From: Interface, Lab In [place] Sent: 07/07/2022   4:02 PM EST To: Gardenia Phlegm, NP

## 2022-07-09 NOTE — Telephone Encounter (Signed)
S/w Nicole Landry per NP and she was informed and educated. She is in agreement with Iron infusion and b12 injection. She is aware we will need the PA team to obtain PA before scheduling infusions. Message sent to PA team and scheduling.

## 2022-07-09 NOTE — Telephone Encounter (Signed)
error 

## 2022-07-10 ENCOUNTER — Telehealth: Payer: Self-pay | Admitting: Adult Health

## 2022-07-10 NOTE — Telephone Encounter (Signed)
Scheduled appointment per 1/5 staff message. Patient is aware of all made appointments.

## 2022-07-13 ENCOUNTER — Inpatient Hospital Stay: Payer: Medicaid Other

## 2022-07-13 VITALS — BP 131/73 | HR 95 | Temp 98.7°F | Resp 18

## 2022-07-13 DIAGNOSIS — O99012 Anemia complicating pregnancy, second trimester: Secondary | ICD-10-CM | POA: Diagnosis not present

## 2022-07-13 DIAGNOSIS — D508 Other iron deficiency anemias: Secondary | ICD-10-CM

## 2022-07-13 MED ORDER — SODIUM CHLORIDE 0.9 % IV SOLN: Freq: Once | INTRAVENOUS | Status: AC

## 2022-07-13 MED ORDER — SODIUM CHLORIDE 0.9 % IV SOLN
200.0000 mg | Freq: Once | INTRAVENOUS | Status: AC
  Administered 2022-07-13: 200 mg via INTRAVENOUS
  Filled 2022-07-13: qty 200

## 2022-07-13 NOTE — Patient Instructions (Signed)

## 2022-07-13 NOTE — Progress Notes (Signed)
FHR 144 per doppler pre iron infusion 1342: FHR 144 per Doppler post iron infusion

## 2022-07-16 ENCOUNTER — Other Ambulatory Visit: Payer: Self-pay

## 2022-07-16 ENCOUNTER — Inpatient Hospital Stay: Payer: Medicaid Other

## 2022-07-16 VITALS — BP 109/63 | HR 100 | Temp 98.5°F | Resp 18

## 2022-07-16 DIAGNOSIS — O99012 Anemia complicating pregnancy, second trimester: Secondary | ICD-10-CM | POA: Diagnosis not present

## 2022-07-16 DIAGNOSIS — D508 Other iron deficiency anemias: Secondary | ICD-10-CM

## 2022-07-16 MED ORDER — SODIUM CHLORIDE 0.9 % IV SOLN
200.0000 mg | Freq: Once | INTRAVENOUS | Status: AC
  Administered 2022-07-16: 200 mg via INTRAVENOUS
  Filled 2022-07-16: qty 200

## 2022-07-16 MED ORDER — SODIUM CHLORIDE 0.9 % IV SOLN: Freq: Once | INTRAVENOUS | Status: AC

## 2022-07-16 NOTE — Progress Notes (Signed)
FHR post iron infusion 135. Patient declined to stay for 30 minute observation. VSS and ambulatory at discharge.

## 2022-07-16 NOTE — Patient Instructions (Signed)

## 2022-07-20 ENCOUNTER — Inpatient Hospital Stay: Payer: Medicaid Other

## 2022-07-20 ENCOUNTER — Other Ambulatory Visit: Payer: Self-pay

## 2022-07-20 VITALS — BP 115/71 | HR 99 | Temp 98.5°F | Resp 18

## 2022-07-20 DIAGNOSIS — D508 Other iron deficiency anemias: Secondary | ICD-10-CM

## 2022-07-20 DIAGNOSIS — O99012 Anemia complicating pregnancy, second trimester: Secondary | ICD-10-CM | POA: Diagnosis not present

## 2022-07-20 MED ORDER — SODIUM CHLORIDE 0.9 % IV SOLN
200.0000 mg | Freq: Once | INTRAVENOUS | Status: AC
  Administered 2022-07-20: 200 mg via INTRAVENOUS
  Filled 2022-07-20: qty 200

## 2022-07-20 MED ORDER — SODIUM CHLORIDE 0.9 % IV SOLN: Freq: Once | INTRAVENOUS | Status: AC

## 2022-07-20 NOTE — Progress Notes (Signed)
FHR prior to Venofer 144 and FHR post Venofer 153. Pt. denies complaints, vital signs stable, left via ambulation, no shortness of breath noted.

## 2022-07-23 ENCOUNTER — Other Ambulatory Visit: Payer: Medicaid Other

## 2022-07-23 ENCOUNTER — Ambulatory Visit: Payer: Medicaid Other

## 2022-07-24 ENCOUNTER — Inpatient Hospital Stay: Payer: Medicaid Other

## 2022-07-28 ENCOUNTER — Other Ambulatory Visit: Payer: Self-pay

## 2022-07-28 ENCOUNTER — Inpatient Hospital Stay: Payer: Medicaid Other

## 2022-07-28 VITALS — BP 109/83 | HR 100 | Temp 98.5°F | Resp 18

## 2022-07-28 DIAGNOSIS — O99012 Anemia complicating pregnancy, second trimester: Secondary | ICD-10-CM | POA: Diagnosis not present

## 2022-07-28 DIAGNOSIS — D508 Other iron deficiency anemias: Secondary | ICD-10-CM

## 2022-07-28 MED ORDER — SODIUM CHLORIDE 0.9 % IV SOLN: Freq: Once | INTRAVENOUS | Status: AC

## 2022-07-28 MED ORDER — SODIUM CHLORIDE 0.9 % IV SOLN
200.0000 mg | Freq: Once | INTRAVENOUS | Status: AC
  Administered 2022-07-28: 200 mg via INTRAVENOUS
  Filled 2022-07-28: qty 200

## 2022-07-28 NOTE — Progress Notes (Signed)
Patient tolerated her infusion well- no s/s of any reaction. Patient has had multiple infusions with no issues. Patient states she needs to get home to her kids and cannot stay for her 30 minute observation. VSS- BP 109/83 (BP Location: Right Arm, Patient Position: Sitting)   Pulse 100   Temp 98.5 F (36.9 C) (Oral)   Resp 18   LMP 03/15/2022   SpO2 100%

## 2022-07-28 NOTE — Patient Instructions (Signed)

## 2022-07-30 ENCOUNTER — Encounter: Payer: Self-pay | Admitting: Adult Health

## 2022-07-30 ENCOUNTER — Inpatient Hospital Stay: Payer: Medicaid Other

## 2022-07-30 ENCOUNTER — Telehealth: Payer: Self-pay

## 2022-07-30 ENCOUNTER — Other Ambulatory Visit: Payer: Self-pay

## 2022-07-30 ENCOUNTER — Inpatient Hospital Stay (HOSPITAL_BASED_OUTPATIENT_CLINIC_OR_DEPARTMENT_OTHER): Payer: Medicaid Other | Admitting: Adult Health

## 2022-07-30 VITALS — BP 129/70 | HR 98 | Temp 98.9°F | Resp 16

## 2022-07-30 DIAGNOSIS — O99012 Anemia complicating pregnancy, second trimester: Secondary | ICD-10-CM | POA: Diagnosis not present

## 2022-07-30 DIAGNOSIS — D508 Other iron deficiency anemias: Secondary | ICD-10-CM

## 2022-07-30 DIAGNOSIS — E538 Deficiency of other specified B group vitamins: Secondary | ICD-10-CM

## 2022-07-30 MED ORDER — SODIUM CHLORIDE 0.9 % IV SOLN: Freq: Once | INTRAVENOUS | Status: AC

## 2022-07-30 MED ORDER — CYANOCOBALAMIN 1000 MCG/ML IJ SOLN
1000.0000 ug | Freq: Once | INTRAMUSCULAR | Status: AC
  Administered 2022-07-30: 1000 ug via INTRAMUSCULAR
  Filled 2022-07-30: qty 1

## 2022-07-30 MED ORDER — SODIUM CHLORIDE 0.9 % IV SOLN
200.0000 mg | Freq: Once | INTRAVENOUS | Status: AC
  Administered 2022-07-30: 200 mg via INTRAVENOUS
  Filled 2022-07-30: qty 200

## 2022-07-30 NOTE — Assessment & Plan Note (Signed)
Patient to begin B12 injections today.  She will receive these today and again on February 12, March 5 and every 4 weeks thereafter.

## 2022-07-30 NOTE — Progress Notes (Signed)
FHT's 142 pre venofer, 130 post venofer. Pt declined 30 min post iron observation. VSS at discharge.

## 2022-07-30 NOTE — Telephone Encounter (Signed)
Called Pt to confirm lab appt for 2/12 per NP request. Pt verbalized understanding.

## 2022-07-30 NOTE — Progress Notes (Signed)
[facility]:    Pcp, No No address on file  I connected with Nicole Landry on 07/30/22 at  9:15 AM EST by telephone and verified that I am speaking with the correct person using two identifiers.  I discussed the limitations, risks, security and privacy concerns of performing an evaluation and management service by telephone and the availability of in person appointments.  I also discussed with the patient that there may be a patient responsible charge related to this service. The patient expressed understanding and agreed to proceed.  Patient location: home Provider location: [facility] office  DIAGNOSIS: iron deficiency anemia, b12 deficiency, h/o pulmonary embolus during previous pregnancy/post partum,   SUMMARY OF HEMATOLOGIC HISTORY: Diagnosed with post partum pulmonary embolism in 02/2021 after C-Section on Lovenox x 6 months.   Hypercoagulable work up negative Positive pregnancy test on 04/25/2022--LMP 03/15/2022, EDD 12/20/2022 Began on Lovenox 0.5mg /kg/day beginning 06/26/2022 Referred from OB/GYN due to anemia: 07/07/2022 Labs: Ferritin 7, folate 21, B12 172, hemoglobin 9.1 with MCV of 72.1   Venofer administered beginning 07/13/2022 (given at 200mg  x 5) B12 injections beginning 07/30/2022  CURRENT THERAPY: IV iron and b12  INTERVAL HISTORY: Nicole Landry 37 y.o. female returns for up of her iron deficiency and B12 deficiency.  She has thus far received 4 out of 5 Venofer treatments at 200 mg each.  She is due for the final 200 mg treatment today.  She will also start on B12 injections today.  She is still feeling tired but notes that she has not felt the urge to chew ice.  Dominic tells me that she is tolerating the IV iron well and has not had any issues with receiving this treatment.  She continues to take her Lovenox and has upcoming follow-up with her maternal-fetal medicine.  She tells me her pregnancy continues to be going well thus  far.   Patient Active Problem List   Diagnosis Date Noted   Iron deficiency anemia 07/08/2022   B12 deficiency 07/07/2022   Encounter for postpartum visit 03/06/2021   Carpal tunnel syndrome of left wrist 03/06/2021   Neuralgia 03/06/2021   Visit for wound check 03/06/2021   PE (pulmonary thromboembolism) ([facility]) 02/26/2021   Postpartum hypertension 02/26/2021   Chest pain 02/26/2021   Shortness of breath 02/26/2021   Obstetric pulmonary embolism, postpartum 02/26/2021   Gestational diabetes mellitus (GDM) in third trimester 12/23/2020   Obesity in pregnancy, antepartum 10/22/2020    is allergic to food and vicodin [hydrocodone-acetaminophen].  MEDICAL HISTORY: Past Medical History:  Diagnosis Date   Blood transfusion without reported diagnosis    after ectopic   Ectopic pregnancy, tubal    Gestational diabetes mellitus 2011   denies gestational DM with current pregnancy   Iron deficiency anemia 07/08/2022   Ovarian cyst    Status post vacuum-assisted vaginal delivery (5/17) 11/20/2015    SURGICAL HISTORY: Past Surgical History:  Procedure Laterality Date   BILATERAL SALPINGECTOMY Right 02/08/2013   Procedure:  SALPINGECTOMY;  Surgeon: 04/10/2013, MD;  Location: [facility];  Service: Gynecology;  Laterality: Right;   CESAREAN SECTION     CESAREAN SECTION N/A 08/18/2017   Procedure: Repeat CESAREAN SECTION;  Surgeon: 08/20/2017, MD;  Location: [facility];  Service: Obstetrics;  Laterality: N/A;  EDD: 08/24/17 Allergy: Vicodin   CESAREAN SECTION N/A 04/12/2019   Procedure: Repeat CESAREAN SECTION;  Surgeon: 06/12/2019, MD;  Location: [facility];  Service: Obstetrics;  Laterality: N/A;  EDD:  04/17/19 Allergy: Vicodin   CESAREAN SECTION N/A 02/21/2021   Procedure: CESAREAN SECTION;  Surgeon: Janyth Pupa, DO;  Location: [facility];  Service: Obstetrics;  Laterality: N/A;   DILATION AND CURETTAGE OF UTERUS     LAPAROSCOPY N/A 02/08/2013   Procedure: LAPAROSCOPY  OPERATIVE;  Surgeon: Lahoma Crocker, MD;  Location: [facility];  Service: Gynecology;  Laterality: N/A;   SALPINGECTOMY Right 02/2013   op note in epic    SOCIAL HISTORY: Social History   Socioeconomic History   Marital status: Married    Spouse name: Not on file   Number of children: Not on file   Years of education: Not on file   Highest education level: Not on file  Occupational History   Not on file  Tobacco Use   Smoking status: Never   Smokeless tobacco: Never  Vaping Use   Vaping Use: Never used  Substance and Sexual Activity   Alcohol use: No   Drug use: No   Sexual activity: Yes    Birth control/protection: None  Other Topics Concern   Not on file  Social History Narrative   ** Merged History Encounter **       Social Determinants of Health   Financial Resource Strain: Low Risk  (03/28/2019)   Overall Financial Resource Strain (CARDIA)    Difficulty of Paying Living Expenses: Not hard at all  Food Insecurity: No Food Insecurity (03/06/2021)   Hunger Vital Sign    Worried About Running Out of Food in the Last Year: Never true    [place] in the Last Year: Never true  Transportation Needs: No Transportation Needs (03/06/2021)   PRAPARE - Hydrologist (Medical): No    Lack of Transportation (Non-Medical): No  Physical Activity: Not on file  Stress: Not on file  Social Connections: Not on file  Intimate Partner Violence: Not on file    FAMILY HISTORY: Family History  Problem Relation Age of Onset   Cancer Maternal Grandmother        unknown cancer in her 61s   Asthma Neg Hx    Depression Neg Hx    Diabetes Neg Hx    Heart disease Neg Hx    Hypertension Neg Hx    Obesity Neg Hx    Miscarriages / Stillbirths Neg Hx     Review of Systems  Constitutional:  Positive for fatigue. Negative for appetite change, chills, fever and unexpected weight change.  HENT:   Negative for hearing loss, lump/mass and trouble swallowing.    Eyes:  Negative for eye problems and icterus.  Respiratory:  Negative for chest tightness, cough and shortness of breath.   Cardiovascular:  Negative for chest pain, leg swelling and palpitations.  Gastrointestinal:  Negative for abdominal distention, abdominal pain, constipation, diarrhea, nausea and vomiting.  Endocrine: Negative for hot flashes.  Genitourinary:  Negative for difficulty urinating.   Musculoskeletal:  Negative for arthralgias.  Skin:  Negative for itching and rash.  Neurological:  Negative for dizziness, extremity weakness, headaches and numbness.  Hematological:  Negative for adenopathy. Does not bruise/bleed easily.  Psychiatric/Behavioral:  Negative for depression. The patient is not nervous/anxious.       PHYSICAL EXAMINATION The patient sounds well she is in no apparent distress.  Mood and behavior are normal.  Breathing is nonlabored.  LABORATORY DATA: None for this visit   ASSESSMENT and THERAPY PLAN:   B12 deficiency Patient to begin B12 injections today.  She will receive these today and again on February 12, March 5 and every 4 weeks thereafter.  Iron deficiency anemia Markelle will proceed with her fifth dose of IV iron today.  She is tolerating this well.   We will see her back on February 12 for labs and follow-up with Dr. Chryl Heck.    All questions were answered. The patient knows to call the clinic with any problems, questions or concerns. We can certainly see the patient much sooner if necessary.  Follow up instructions:    -Return to cancer center today for IV iron then on 2/12 for labs, f/u with Dr. Chryl Heck, and b12 injection   The patient was provided an opportunity to ask questions and all were answered. The patient agreed with the plan and demonstrated an understanding of the instructions.   The patient was advised to call back or seek an in-person evaluation if the symptoms worsen or if the condition fails to improve as anticipated.   I  provided 10 minutes of non face-to-face telephone visit time during this encounter, and > 50% was spent counseling as documented under my assessment & plan.  Wilber Bihari, NP 07/30/22 9:37 AM Medical Oncology and Hematology [facility] [place], [place] [address] Tel. [phone]    Fax. [phone]

## 2022-07-30 NOTE — Assessment & Plan Note (Signed)
Nicole Landry will proceed with her fifth dose of IV iron today.  She is tolerating this well.   We will see her back on February 12 for labs and follow-up with Dr. Chryl Heck.

## 2022-07-30 NOTE — Patient Instructions (Signed)

## 2022-07-31 ENCOUNTER — Other Ambulatory Visit: Payer: Self-pay | Admitting: Maternal & Fetal Medicine

## 2022-07-31 ENCOUNTER — Ambulatory Visit: Payer: Medicaid Other | Admitting: *Deleted

## 2022-07-31 ENCOUNTER — Ambulatory Visit: Payer: Medicaid Other | Attending: Maternal & Fetal Medicine

## 2022-07-31 VITALS — BP 117/59 | HR 97

## 2022-07-31 DIAGNOSIS — O09522 Supervision of elderly multigravida, second trimester: Secondary | ICD-10-CM

## 2022-07-31 DIAGNOSIS — O10012 Pre-existing essential hypertension complicating pregnancy, second trimester: Secondary | ICD-10-CM

## 2022-07-31 DIAGNOSIS — O99212 Obesity complicating pregnancy, second trimester: Secondary | ICD-10-CM

## 2022-07-31 DIAGNOSIS — Z86711 Personal history of pulmonary embolism: Secondary | ICD-10-CM | POA: Diagnosis present

## 2022-07-31 DIAGNOSIS — O34219 Maternal care for unspecified type scar from previous cesarean delivery: Secondary | ICD-10-CM | POA: Diagnosis not present

## 2022-07-31 DIAGNOSIS — Z3A19 19 weeks gestation of pregnancy: Secondary | ICD-10-CM

## 2022-07-31 DIAGNOSIS — E669 Obesity, unspecified: Secondary | ICD-10-CM

## 2022-07-31 DIAGNOSIS — O10919 Unspecified pre-existing hypertension complicating pregnancy, unspecified trimester: Secondary | ICD-10-CM

## 2022-08-04 ENCOUNTER — Ambulatory Visit: Payer: Medicaid Other | Attending: Cardiology | Admitting: Cardiology

## 2022-08-10 ENCOUNTER — Inpatient Hospital Stay: Payer: Medicaid Other

## 2022-08-10 ENCOUNTER — Other Ambulatory Visit: Payer: Self-pay

## 2022-08-10 ENCOUNTER — Inpatient Hospital Stay: Payer: Medicaid Other | Attending: Adult Health

## 2022-08-10 DIAGNOSIS — D509 Iron deficiency anemia, unspecified: Secondary | ICD-10-CM | POA: Insufficient documentation

## 2022-08-10 DIAGNOSIS — O99012 Anemia complicating pregnancy, second trimester: Secondary | ICD-10-CM | POA: Insufficient documentation

## 2022-08-10 DIAGNOSIS — D508 Other iron deficiency anemias: Secondary | ICD-10-CM

## 2022-08-10 DIAGNOSIS — E538 Deficiency of other specified B group vitamins: Secondary | ICD-10-CM | POA: Insufficient documentation

## 2022-08-10 MED ORDER — CYANOCOBALAMIN 1000 MCG/ML IJ SOLN: 1000.0000 ug | Freq: Once | INTRAMUSCULAR | Status: DC

## 2022-08-14 ENCOUNTER — Other Ambulatory Visit: Payer: Self-pay | Admitting: *Deleted

## 2022-08-14 DIAGNOSIS — D508 Other iron deficiency anemias: Secondary | ICD-10-CM

## 2022-08-17 ENCOUNTER — Inpatient Hospital Stay: Payer: Medicaid Other | Admitting: Hematology and Oncology

## 2022-08-17 ENCOUNTER — Telehealth: Payer: Self-pay | Admitting: Hematology and Oncology

## 2022-08-17 ENCOUNTER — Inpatient Hospital Stay: Payer: Medicaid Other

## 2022-08-17 NOTE — Telephone Encounter (Signed)
Spoke with patient confirming upcoming appointments  

## 2022-08-20 ENCOUNTER — Other Ambulatory Visit: Payer: Self-pay

## 2022-08-20 ENCOUNTER — Inpatient Hospital Stay: Payer: Medicaid Other

## 2022-08-20 ENCOUNTER — Encounter: Payer: Self-pay | Admitting: Hematology and Oncology

## 2022-08-20 ENCOUNTER — Inpatient Hospital Stay (HOSPITAL_BASED_OUTPATIENT_CLINIC_OR_DEPARTMENT_OTHER): Payer: Medicaid Other | Admitting: Hematology and Oncology

## 2022-08-20 VITALS — BP 124/71 | HR 91 | Temp 97.9°F | Resp 16 | Ht 63.0 in | Wt 243.3 lb

## 2022-08-20 DIAGNOSIS — D509 Iron deficiency anemia, unspecified: Secondary | ICD-10-CM | POA: Diagnosis present

## 2022-08-20 DIAGNOSIS — E538 Deficiency of other specified B group vitamins: Secondary | ICD-10-CM | POA: Diagnosis not present

## 2022-08-20 DIAGNOSIS — O99012 Anemia complicating pregnancy, second trimester: Secondary | ICD-10-CM | POA: Diagnosis present

## 2022-08-20 DIAGNOSIS — D508 Other iron deficiency anemias: Secondary | ICD-10-CM

## 2022-08-20 DIAGNOSIS — D649 Anemia, unspecified: Secondary | ICD-10-CM

## 2022-08-20 LAB — CMP (CANCER CENTER ONLY)
ALT: 6 U/L (ref 0–44)
AST: 8 U/L — ABNORMAL LOW (ref 15–41)
Albumin: 3.8 g/dL (ref 3.5–5.0)
Alkaline Phosphatase: 43 U/L (ref 38–126)
Anion gap: 8 (ref 5–15)
BUN: 5 mg/dL — ABNORMAL LOW (ref 6–20)
CO2: 26 mmol/L (ref 22–32)
Calcium: 9 mg/dL (ref 8.9–10.3)
Chloride: 104 mmol/L (ref 98–111)
Creatinine: 0.39 mg/dL — ABNORMAL LOW (ref 0.44–1.00)
GFR, Estimated: >60 mL/min (ref >60)
Glucose, Bld: 97 mg/dL (ref 70–99)
Potassium: 3.8 mmol/L (ref 3.5–5.1)
Sodium: 138 mmol/L (ref 135–145)
Total Bilirubin: 0.2 mg/dL — ABNORMAL LOW (ref 0.3–1.2)
Total Protein: 6.8 g/dL (ref 6.5–8.1)

## 2022-08-20 LAB — CBC WITH DIFFERENTIAL (CANCER CENTER ONLY)
Abs Immature Granulocytes: 0.03 10*3/uL (ref 0.00–0.07)
Basophils Absolute: 0 10*3/uL (ref 0.0–0.1)
Basophils Relative: 0 %
Eosinophils Absolute: 0 10*3/uL (ref 0.0–0.5)
Eosinophils Relative: 0 %
HCT: 34.2 % — ABNORMAL LOW (ref 36.0–46.0)
Hemoglobin: 11 g/dL — ABNORMAL LOW (ref 12.0–15.0)
Immature Granulocytes: 1 %
Lymphocytes Relative: 16 %
Lymphs Abs: 1.1 10*3/uL (ref 0.7–4.0)
MCH: 26.1 pg (ref 26.0–34.0)
MCHC: 32.2 g/dL (ref 30.0–36.0)
MCV: 81 fL (ref 80.0–100.0)
Monocytes Absolute: 0.3 10*3/uL (ref 0.1–1.0)
Monocytes Relative: 5 %
Neutro Abs: 5.2 10*3/uL (ref 1.7–7.7)
Neutrophils Relative %: 78 %
Platelet Count: 298 10*3/uL (ref 150–400)
RBC: 4.22 MIL/uL (ref 3.87–5.11)
RDW: 21.7 % — ABNORMAL HIGH (ref 11.5–15.5)
WBC Count: 6.7 10*3/uL (ref 4.0–10.5)
nRBC: 0 % (ref 0.0–0.2)

## 2022-08-20 LAB — IRON AND IRON BINDING CAPACITY (CC-WL,HP ONLY)
Iron: 60 ug/dL (ref 28–170)
Saturation Ratios: 13 % (ref 10.4–31.8)
TIBC: 479 ug/dL — ABNORMAL HIGH (ref 250–450)
UIBC: 419 ug/dL (ref 148–442)

## 2022-08-20 LAB — VITAMIN B12: Vitamin B-12: 161 pg/mL — ABNORMAL LOW (ref 180–914)

## 2022-08-20 LAB — RETIC PANEL
Immature Retic Fract: 18.9 % — ABNORMAL HIGH (ref 2.3–15.9)
RBC.: 4.18 MIL/uL (ref 3.87–5.11)
Retic Count, Absolute: 97.4 10*3/uL (ref 19.0–186.0)
Retic Ct Pct: 2.3 % (ref 0.4–3.1)
Reticulocyte Hemoglobin: 27.6 pg — ABNORMAL LOW (ref >27.9)

## 2022-08-20 LAB — FERRITIN: Ferritin: 43 ng/mL (ref 11–307)

## 2022-08-20 MED ORDER — CYANOCOBALAMIN 1000 MCG/ML IJ SOLN
1000.0000 ug | Freq: Once | INTRAMUSCULAR | Status: AC
  Administered 2022-08-20: 1000 ug via INTRAMUSCULAR
  Filled 2022-08-20: qty 1

## 2022-08-20 NOTE — Progress Notes (Signed)
[facility] Follow up:    Donnel Saxon, CNM [address] [place] [address]   DIAGNOSIS: iron deficiency anemia, b12 deficiency, h/o pulmonary embolus during previous pregnancy/post partum,   SUMMARY OF HEMATOLOGIC HISTORY: Diagnosed with post partum pulmonary embolism in 02/2021 after C-Section on Lovenox x 6 months.   Hypercoagulable work up negative Positive pregnancy test on 04/25/2022--LMP 03/15/2022, EDD 12/20/2022 Began on Lovenox 0.49m/kg/day beginning 06/26/2022 Referred from OB/GYN due to anemia: 07/07/2022 Labs: Ferritin 7, folate 21, B12 172, hemoglobin 9.1 with MCV of 72.1   Venofer administered beginning 07/13/2022 (given at 2029mx 5) B12 injections beginning 07/30/2022  CURRENT THERAPY: IV iron and b12  INTERVAL HISTORY: DoPORSCHE SANTELL37.o. female returns for up of her iron deficiency and B12 deficiency.   She is now currently pregnant with her sixth child.  She is [redacted] weeks pregnant.  She most recently received iron and tolerated it very well.  She does notice some improvement in her fatigue but still feels tired.  Everything with the pregnancy is going well except for some amniotic fluid leak apparently but this is being watched by the OB/GYN team.  She is also getting B12 injections.  Rest of the pertinent 10 point ROS reviewed and negative  Patient Active Problem List   Diagnosis Date Noted   Iron deficiency anemia 07/08/2022   B12 deficiency 07/07/2022   Encounter for postpartum visit 03/06/2021   Carpal tunnel syndrome of left wrist 03/06/2021   Neuralgia 03/06/2021   Visit for wound check 03/06/2021   PE (pulmonary thromboembolism) ([facility]08/24/2022   Postpartum hypertension 02/26/2021   Chest pain 02/26/2021   Shortness of breath 02/26/2021   Obstetric pulmonary embolism, postpartum 02/26/2021   Gestational diabetes mellitus (GDM) in third trimester 12/23/2020   Obesity in pregnancy, antepartum 10/22/2020    is  allergic to food and vicodin [hydrocodone-acetaminophen].  MEDICAL HISTORY: Past Medical History:  Diagnosis Date   Blood transfusion without reported diagnosis    after ectopic   Ectopic pregnancy, tubal    Gestational diabetes mellitus 2011   denies gestational DM with current pregnancy   Iron deficiency anemia 07/08/2022   Ovarian cyst    Status post vacuum-assisted vaginal delivery (5/17) 11/20/2015    SURGICAL HISTORY: Past Surgical History:  Procedure Laterality Date   BILATERAL SALPINGECTOMY Right 02/08/2013   Procedure:  SALPINGECTOMY;  Surgeon: LiLahoma CrockerMD;  [facility];  Service: Gynecology;  Laterality: Right;   CESAREAN SECTION     CESAREAN SECTION N/A 08/18/2017   Procedure: Repeat CESAREAN SECTION;  Surgeon: MoAzucena FallenMD;  [facility] Service: [facility];  Laterality: N/A;  EDD: 08/24/17 Allergy: Vicodin   CESAREAN SECTION N/A 04/12/2019   Procedure: Repeat CESAREAN SECTION;  Surgeon: MoAzucena FallenMD;  [facility];  Service: Obstetrics;  Laterality: N/A;  EDD: 04/17/19 Allergy: Vicodin   CESAREAN SECTION N/A 02/21/2021   Procedure: CESAREAN SECTION;  Surgeon: OzJanyth PupaDO;  [facility];  Service: Obstetrics;  Laterality: N/A;   DILATION AND CURETTAGE OF UTERUS     LAPAROSCOPY N/A 02/08/2013   Procedure: LAPAROSCOPY OPERATIVE;  Surgeon: LiLahoma CrockerMD;  [facility];  Service: Gynecology;  Laterality: N/A;   SALPINGECTOMY Right 02/2013   op note in epic    SOCIAL HISTORY: Social History   Socioeconomic History   Marital status: Married    Spouse name: Not on file   Number of children: Not  on file   Years of education: Not on file   Highest education level: Not on file  Occupational History   Not on file  Tobacco Use   Smoking status: Never   Smokeless tobacco: Never  Vaping Use   Vaping Use: Never used  Substance and Sexual Activity   Alcohol use: No   Drug use: No   Sexual  activity: Yes    Birth control/protection: None  Other Topics Concern   Not on file  Social History Narrative   ** Merged History Encounter **       Social Determinants of Health   Financial Resource Strain: Low Risk  (03/28/2019)   Overall Financial Resource Strain (CARDIA)    Difficulty of Paying Living Expenses: Not hard at all  Food Insecurity: No Food Insecurity (03/06/2021)   Hunger Vital Sign    Worried About Running Out of Food in the Last Year: Never true    [place] in the Last Year: Never true  Transportation Needs: No Transportation Needs (03/06/2021)   PRAPARE - Hydrologist (Medical): No    Lack of Transportation (Non-Medical): No  Physical Activity: Not on file  Stress: Not on file  Social Connections: Not on file  Intimate Partner Violence: Not on file    FAMILY HISTORY: Family History  Problem Relation Age of Onset   Cancer Maternal Grandmother        unknown cancer in her 69s   Asthma Neg Hx    Depression Neg Hx    Diabetes Neg Hx    Heart disease Neg Hx    Hypertension Neg Hx    Obesity Neg Hx    Miscarriages / Stillbirths Neg Hx     Review of Systems  Constitutional:  Positive for fatigue. Negative for appetite change, chills, fever and unexpected weight change.  HENT:   Negative for hearing loss, lump/mass and trouble swallowing.   Eyes:  Negative for eye problems and icterus.  Respiratory:  Negative for chest tightness, cough and shortness of breath.   Cardiovascular:  Negative for chest pain, leg swelling and palpitations.  Gastrointestinal:  Negative for abdominal distention, abdominal pain, constipation, diarrhea, nausea and vomiting.  Endocrine: Negative for hot flashes.  Genitourinary:  Negative for difficulty urinating.   Musculoskeletal:  Negative for arthralgias.  Skin:  Negative for itching and rash.  Neurological:  Negative for dizziness, extremity weakness, headaches and numbness.  Hematological:   Negative for adenopathy. Does not bruise/bleed easily.  Psychiatric/Behavioral:  Negative for depression. The patient is not nervous/anxious.      PHYSICAL EXAMINATION  Physical Exam Constitutional:      Appearance: Normal appearance.  Cardiovascular:     Rate and Rhythm: Normal rate and regular rhythm.  Pulmonary:     Effort: Pulmonary effort is normal.     Breath sounds: Normal breath sounds.  Musculoskeletal:        General: No swelling.     Cervical back: Normal range of motion and neck supple. No rigidity.  Lymphadenopathy:     Cervical: No cervical adenopathy.  Skin:    General: Skin is warm and dry.  Neurological:     Mental Status: She is alert.     LABORATORY DATA: None for this visit   ASSESSMENT and THERAPY PLAN:   This is a pleasant 37 year old female patient who initially was seen by [place] for postpartum PE diagnosed back in August 2022 status post 3 to 6 months  of anticoagulation who is here in for follow-up for iron deficiency anemia.  She had hypercoagulable workup which is negative.  She also complained of some abnormal pain in her right breast for which she underwent mammogram and ultrasound which showed probably benign asymmetrical density of the right breast.  She is currently pregnant again hence this mammogram and ultrasound which was initially recommended to be repeated in 6 months may have to be postponed.  She denied any breast complaints today.  She continues on Lovenox again.  We have recommended that she continue Lovenox for at least 6 weeks postpartum and throughout the pregnancy. She also is status post iron infusion for iron deficiency.  Her hemoglobin today is 11 g which is quite remarkable.  I do not believe she needs any more iron infusions at this time.  She can continue her prenatal vitamins.  She can continue B12 injections as scheduled.  She can follow-up with our APP in about 3 months.  She can return to clinic after that as needed.

## 2022-08-21 ENCOUNTER — Telehealth: Payer: Self-pay | Admitting: Hematology and Oncology

## 2022-08-21 NOTE — Telephone Encounter (Signed)
Spoke with patient confirming upcoming appointments  

## 2022-08-26 LAB — FOLATE RBC
Folate, Hemolysate: 462 ng/mL
Folate, RBC: 1309 ng/mL (ref >498)
Hematocrit: 35.3 % (ref 34.0–46.6)

## 2022-09-04 ENCOUNTER — Ambulatory Visit: Payer: Medicaid Other | Attending: Maternal & Fetal Medicine

## 2022-09-04 ENCOUNTER — Other Ambulatory Visit: Payer: Self-pay | Admitting: *Deleted

## 2022-09-04 ENCOUNTER — Ambulatory Visit: Payer: Medicaid Other | Attending: Maternal & Fetal Medicine | Admitting: Maternal & Fetal Medicine

## 2022-09-04 ENCOUNTER — Ambulatory Visit: Payer: Medicaid Other | Admitting: *Deleted

## 2022-09-04 VITALS — BP 120/73 | HR 99

## 2022-09-04 DIAGNOSIS — O34219 Maternal care for unspecified type scar from previous cesarean delivery: Secondary | ICD-10-CM | POA: Diagnosis not present

## 2022-09-04 DIAGNOSIS — O09522 Supervision of elderly multigravida, second trimester: Secondary | ICD-10-CM

## 2022-09-04 DIAGNOSIS — Z86711 Personal history of pulmonary embolism: Secondary | ICD-10-CM

## 2022-09-04 DIAGNOSIS — O10919 Unspecified pre-existing hypertension complicating pregnancy, unspecified trimester: Secondary | ICD-10-CM | POA: Diagnosis present

## 2022-09-04 DIAGNOSIS — Z3A24 24 weeks gestation of pregnancy: Secondary | ICD-10-CM

## 2022-09-04 DIAGNOSIS — O43892 Other placental disorders, second trimester: Secondary | ICD-10-CM | POA: Diagnosis not present

## 2022-09-04 DIAGNOSIS — O99212 Obesity complicating pregnancy, second trimester: Secondary | ICD-10-CM | POA: Diagnosis present

## 2022-09-04 DIAGNOSIS — O10012 Pre-existing essential hypertension complicating pregnancy, second trimester: Secondary | ICD-10-CM | POA: Diagnosis not present

## 2022-09-04 DIAGNOSIS — I2699 Other pulmonary embolism without acute cor pulmonale: Secondary | ICD-10-CM

## 2022-09-04 DIAGNOSIS — O0942 Supervision of pregnancy with grand multiparity, second trimester: Secondary | ICD-10-CM

## 2022-09-04 DIAGNOSIS — O4392 Unspecified placental disorder, second trimester: Secondary | ICD-10-CM

## 2022-09-04 DIAGNOSIS — Z7901 Long term (current) use of anticoagulants: Secondary | ICD-10-CM

## 2022-09-04 DIAGNOSIS — O43102 Malformation of placenta, unspecified, second trimester: Secondary | ICD-10-CM

## 2022-09-04 DIAGNOSIS — E669 Obesity, unspecified: Secondary | ICD-10-CM

## 2022-09-04 NOTE — Progress Notes (Signed)
MFM Consult Note Patient Name: Nicole Landry  Patient MRN:   [id number]  Referring provider: Donnel Saxon, [place] [address] [place],  [place] [address] Reason for Consult: Abnormal placenta with multiple placental lakes history of prior C-section x 4  HPI: Nicole Landry is a 37 y.o. [id number] at [id number] for ultrasound and consultation.   The patient has a history of C-section x 4.  She reports that none of her previous C-sections had complications or excessive scar tissue.  We discussed the findings in her placenta today consisting of multiple enlarged lacunae.  I discussed that this is a sonographic marker for placenta accreta spectrum.  The edge of the placenta does appear to be coming down to the level of the uterine scar.  The myometrium appears to be relatively thick and between the placenta and the abdominal wall but the bladder is empty and the lower uterine segment bladder interface is not able to be assessed.  I discussed that right now there is a low suspicion for placenta accreta spectrum but that further investigation is required to rule out this diagnosis.  Will bring her back in 1 week for a limited ultrasound with possible transvaginal ultrasound with a full bladder to better assess the uterine placental bladder interface with color Doppler.  I briefly discussed the importance of monitoring for symptoms such as labor and bleeding which she currently denies either.  I also discussed that the natural course of placenta accreta spectrum involves an early delivery via cesarean hysterectomy.  Review of Systems: A review of systems was performed and was negative except per HPI   Vitals and Physical Exam BP 120/73, HR 99, pregravid BMI: 38.  Sitting comfortably on the sonogram table Nonlabored breathing Normal rate and rhythm Abdomen is non-tender  Sonographic findings Single intrauterine pregnancy. Fetal cardiac activity:  Observed and appears  normal. Presentation: Cephalic. Interval fetal anatomy appears normal. Fetal biometry shows the estimated fetal weight at the 82 percentile. Amniotic fluid volume: Within normal limits. MVP: 5.47 cm. Placenta:  I discussed that this is a sonographic marker for placenta accreta spectrum.  The edge of the placenta does appear to be coming down to the level of the uterine scar.  The myometrium appears to be relatively thick and between the placenta and the abdominal wall but the bladder is empty and the lower uterine segment bladder interface is not able to be assessed.   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 Based on this finding we will refer to the appropriate facility if we have concern for PAS.   I spent 30 minutes reviewing the patients chart, including labs and images as well as counseling the patient about her medical conditions.  BValeda Malm MFM, [place]   09/04/2022  3:21 PM

## 2022-09-07 ENCOUNTER — Inpatient Hospital Stay: Payer: Medicaid Other | Attending: Adult Health

## 2022-09-07 DIAGNOSIS — Z3A23 23 weeks gestation of pregnancy: Secondary | ICD-10-CM | POA: Insufficient documentation

## 2022-09-07 DIAGNOSIS — E538 Deficiency of other specified B group vitamins: Secondary | ICD-10-CM | POA: Insufficient documentation

## 2022-09-07 DIAGNOSIS — O99012 Anemia complicating pregnancy, second trimester: Secondary | ICD-10-CM | POA: Insufficient documentation

## 2022-09-07 DIAGNOSIS — D509 Iron deficiency anemia, unspecified: Secondary | ICD-10-CM | POA: Insufficient documentation

## 2022-09-10 ENCOUNTER — Ambulatory Visit: Payer: Medicaid Other

## 2022-09-10 ENCOUNTER — Ambulatory Visit: Payer: Medicaid Other | Attending: Maternal & Fetal Medicine

## 2022-09-10 VITALS — BP 122/76 | HR 92

## 2022-09-10 DIAGNOSIS — O09522 Supervision of elderly multigravida, second trimester: Secondary | ICD-10-CM | POA: Diagnosis not present

## 2022-09-10 DIAGNOSIS — O99212 Obesity complicating pregnancy, second trimester: Secondary | ICD-10-CM | POA: Diagnosis present

## 2022-09-10 DIAGNOSIS — E669 Obesity, unspecified: Secondary | ICD-10-CM

## 2022-09-10 DIAGNOSIS — O43892 Other placental disorders, second trimester: Secondary | ICD-10-CM | POA: Diagnosis not present

## 2022-09-10 DIAGNOSIS — O34219 Maternal care for unspecified type scar from previous cesarean delivery: Secondary | ICD-10-CM

## 2022-09-10 DIAGNOSIS — Z98891 History of uterine scar from previous surgery: Secondary | ICD-10-CM

## 2022-09-10 DIAGNOSIS — Z86711 Personal history of pulmonary embolism: Secondary | ICD-10-CM

## 2022-09-10 DIAGNOSIS — Z3A25 25 weeks gestation of pregnancy: Secondary | ICD-10-CM

## 2022-09-10 DIAGNOSIS — O10012 Pre-existing essential hypertension complicating pregnancy, second trimester: Secondary | ICD-10-CM

## 2022-09-10 DIAGNOSIS — O10912 Unspecified pre-existing hypertension complicating pregnancy, second trimester: Secondary | ICD-10-CM | POA: Insufficient documentation

## 2022-09-10 DIAGNOSIS — O4392 Unspecified placental disorder, second trimester: Secondary | ICD-10-CM | POA: Insufficient documentation

## 2022-09-10 DIAGNOSIS — O0942 Supervision of pregnancy with grand multiparity, second trimester: Secondary | ICD-10-CM

## 2022-09-10 DIAGNOSIS — Z7901 Long term (current) use of anticoagulants: Secondary | ICD-10-CM

## 2022-09-15 ENCOUNTER — Telehealth: Payer: Self-pay

## 2022-09-15 NOTE — Telephone Encounter (Signed)
Spoke with patient regarding injection appointment she was requesting to reschedule previously. She is rescheduled for 09/16/2022 at 11:30am and is okay per Wilber Bihari NP. Patient verbalized understanding.

## 2022-09-16 ENCOUNTER — Inpatient Hospital Stay: Payer: Medicaid Other

## 2022-09-16 ENCOUNTER — Other Ambulatory Visit: Payer: Self-pay

## 2022-09-16 ENCOUNTER — Encounter: Payer: Self-pay | Admitting: Hematology and Oncology

## 2022-09-16 DIAGNOSIS — Z3A23 23 weeks gestation of pregnancy: Secondary | ICD-10-CM | POA: Diagnosis not present

## 2022-09-16 DIAGNOSIS — E538 Deficiency of other specified B group vitamins: Secondary | ICD-10-CM | POA: Diagnosis not present

## 2022-09-16 DIAGNOSIS — D508 Other iron deficiency anemias: Secondary | ICD-10-CM

## 2022-09-16 DIAGNOSIS — D509 Iron deficiency anemia, unspecified: Secondary | ICD-10-CM | POA: Diagnosis not present

## 2022-09-16 DIAGNOSIS — O99012 Anemia complicating pregnancy, second trimester: Secondary | ICD-10-CM | POA: Diagnosis present

## 2022-09-16 MED ORDER — CYANOCOBALAMIN 1000 MCG/ML IJ SOLN
1000.0000 ug | Freq: Once | INTRAMUSCULAR | Status: AC
  Administered 2022-09-16: 1000 ug via INTRAMUSCULAR
  Filled 2022-09-16: qty 1

## 2022-10-01 ENCOUNTER — Other Ambulatory Visit: Payer: Self-pay | Admitting: *Deleted

## 2022-10-01 ENCOUNTER — Ambulatory Visit: Payer: Medicaid Other | Admitting: *Deleted

## 2022-10-01 ENCOUNTER — Ambulatory Visit: Payer: Medicaid Other | Attending: Maternal & Fetal Medicine

## 2022-10-01 VITALS — BP 116/70 | HR 118

## 2022-10-01 DIAGNOSIS — O10013 Pre-existing essential hypertension complicating pregnancy, third trimester: Secondary | ICD-10-CM

## 2022-10-01 DIAGNOSIS — O0943 Supervision of pregnancy with grand multiparity, third trimester: Secondary | ICD-10-CM

## 2022-10-01 DIAGNOSIS — O43893 Other placental disorders, third trimester: Secondary | ICD-10-CM

## 2022-10-01 DIAGNOSIS — O09523 Supervision of elderly multigravida, third trimester: Secondary | ICD-10-CM | POA: Insufficient documentation

## 2022-10-01 DIAGNOSIS — O34219 Maternal care for unspecified type scar from previous cesarean delivery: Secondary | ICD-10-CM

## 2022-10-01 DIAGNOSIS — O10919 Unspecified pre-existing hypertension complicating pregnancy, unspecified trimester: Secondary | ICD-10-CM | POA: Diagnosis not present

## 2022-10-01 DIAGNOSIS — Z3A28 28 weeks gestation of pregnancy: Secondary | ICD-10-CM

## 2022-10-01 DIAGNOSIS — E669 Obesity, unspecified: Secondary | ICD-10-CM

## 2022-10-01 DIAGNOSIS — O99212 Obesity complicating pregnancy, second trimester: Secondary | ICD-10-CM | POA: Diagnosis present

## 2022-10-01 DIAGNOSIS — O99213 Obesity complicating pregnancy, third trimester: Secondary | ICD-10-CM

## 2022-10-01 DIAGNOSIS — Z7901 Long term (current) use of anticoagulants: Secondary | ICD-10-CM

## 2022-10-01 DIAGNOSIS — O09522 Supervision of elderly multigravida, second trimester: Secondary | ICD-10-CM | POA: Diagnosis present

## 2022-10-01 DIAGNOSIS — Z86711 Personal history of pulmonary embolism: Secondary | ICD-10-CM

## 2022-10-02 ENCOUNTER — Encounter: Payer: Self-pay | Admitting: Cardiology

## 2022-10-02 ENCOUNTER — Ambulatory Visit (INDEPENDENT_AMBULATORY_CARE_PROVIDER_SITE_OTHER): Payer: Medicaid Other | Admitting: Cardiology

## 2022-10-02 VITALS — BP 132/84 | HR 104 | Ht 64.0 in | Wt 248.4 lb

## 2022-10-02 DIAGNOSIS — O99210 Obesity complicating pregnancy, unspecified trimester: Secondary | ICD-10-CM | POA: Diagnosis not present

## 2022-10-02 DIAGNOSIS — Z86711 Personal history of pulmonary embolism: Secondary | ICD-10-CM | POA: Diagnosis not present

## 2022-10-02 DIAGNOSIS — O09523 Supervision of elderly multigravida, third trimester: Secondary | ICD-10-CM

## 2022-10-02 DIAGNOSIS — I38 Endocarditis, valve unspecified: Secondary | ICD-10-CM

## 2022-10-02 DIAGNOSIS — R0600 Dyspnea, unspecified: Secondary | ICD-10-CM

## 2022-10-02 DIAGNOSIS — Z3A28 28 weeks gestation of pregnancy: Secondary | ICD-10-CM

## 2022-10-02 NOTE — Progress Notes (Signed)
Cardio-Obstetrics Clinic  New Evaluation  Date:  10/02/2022   ID:  Nicole Landry, DOB 07/06/1986, MRN [id number]  PCP:  Donnel Saxon, [place] Providers Cardiologist:  None  Electrophysiologist:  None       Referring MD: Donnel Saxon, CNM   Chief Complaint: " I was referred because of my history"  History of Present Illness:    Nicole Landry is a 37 y.o. female [[id number]] who is being seen today for the evaluation of hx of gestational diabetes and hypertension at the request of Donnel Saxon, [place].   Medical history includes postpartum pulmonary embolism in 2022 she was on Lovenox for 6 months, obesity, gestational diabetes and B12 deficiency.  She is here today because she was referred to cardiology given her history.  She does not offer any complaints specifically at this time but has had some shortness of breath.  She is currently 28 weeks 5 days pregnant.  Prior CV Studies Reviewed: The following studies were reviewed today: None   Past Medical History:  Diagnosis Date   Blood transfusion without reported diagnosis    after ectopic   Ectopic pregnancy, tubal    Gestational diabetes mellitus 2011   denies gestational DM with current pregnancy   Iron deficiency anemia 07/08/2022   Ovarian cyst    Status post vacuum-assisted vaginal delivery (5/17) 11/20/2015    Past Surgical History:  Procedure Laterality Date   BILATERAL SALPINGECTOMY Right 02/08/2013   Procedure:  SALPINGECTOMY;  Surgeon: Lahoma Crocker, MD;  Location: [facility];  Service: Gynecology;  Laterality: Right;   CESAREAN SECTION     CESAREAN SECTION N/A 08/18/2017   Procedure: Repeat CESAREAN SECTION;  Surgeon: Azucena Fallen, MD;  Location: [facility];  Service: Obstetrics;  Laterality: N/A;  EDD: 08/24/17 Allergy: Vicodin   CESAREAN SECTION N/A 04/12/2019   Procedure: Repeat CESAREAN SECTION;  Surgeon: Azucena Fallen, MD;  Location: [facility];  Service: Obstetrics;   Laterality: N/A;  EDD: 04/17/19 Allergy: Vicodin   CESAREAN SECTION N/A 02/21/2021   Procedure: CESAREAN SECTION;  Surgeon: Janyth Pupa, DO;  [facility];  Service: Obstetrics;  Laterality: N/A;   DILATION AND CURETTAGE OF UTERUS     LAPAROSCOPY N/A 02/08/2013   Procedure: LAPAROSCOPY OPERATIVE;  Surgeon: Lahoma Crocker, MD;  Location: [facility];  Service: Gynecology;  Laterality: N/A;   SALPINGECTOMY Right 02/2013   op note in epic      OB History     Gravida  8   Para  5   Term  5   Preterm  0   AB  2   Living  5      SAB  1   IAB  0   Ectopic  1   Multiple  0   Live Births  5        Obstetric Comments  Pt states does not want a C/S for this Pregnancy.             Current Medications: Current Meds  Medication Sig   cholecalciferol (VITAMIN D3) 25 MCG (1000 UNIT) tablet Take 1,000 Units by mouth daily.   Cyanocobalamin (B-12 COMPLIANCE INJECTION) 1000 MCG/ML KIT Inject as directed.   enoxaparin (LOVENOX) 100 MG/ML injection Inject 100 mg into the skin daily.   Prenatal Vit-Fe Fumarate-FA (PRENATAL MULTIVITAMIN) TABS tablet Take 1 tablet by mouth daily at 12 noon.     Allergies:   Food and Vicodin [hydrocodone-acetaminophen]   Social History  Socioeconomic History   Marital status: Married    Spouse name: Not on file   Number of children: Not on file   Years of education: Not on file   Highest education level: Not on file  Occupational History   Not on file  Tobacco Use   Smoking status: Never   Smokeless tobacco: Never  Vaping Use   Vaping Use: Never used  Substance and Sexual Activity   Alcohol use: No   Drug use: No   Sexual activity: Yes    Birth control/protection: None  Other Topics Concern   Not on file  Social History Narrative   ** Merged History Encounter **       Social Determinants of Health   Financial Resource Strain: Low Risk  (03/28/2019)   Overall Financial Resource Strain (CARDIA)    Difficulty of  Paying Living Expenses: Not hard at all  Food Insecurity: No Food Insecurity (03/06/2021)   Hunger Vital Sign    Worried About Running Out of Food in the Last Year: Never true    [place] in the Last Year: Never true  Transportation Needs: No Transportation Needs (03/06/2021)   PRAPARE - Hydrologist (Medical): No    Lack of Transportation (Non-Medical): No  Physical Activity: Not on file  Stress: Not on file  Social Connections: Not on file      Family History  Problem Relation Age of Onset   Cancer Maternal Grandmother        unknown cancer in her 44s   Asthma Neg Hx    Depression Neg Hx    Diabetes Neg Hx    Heart disease Neg Hx    Hypertension Neg Hx    Obesity Neg Hx    Miscarriages / Stillbirths Neg Hx       ROS:   Please see the history of present illness.    Yesterday All other systems reviewed and are negative.   Labs/EKG Reviewed:    EKG:   EKG is was not ordered today.   Recent Labs: 08/20/2022: ALT 6; BUN 5; Creatinine 0.39; Hemoglobin 11.0; Platelet Count 298; Potassium 3.8; Sodium 138   Recent Lipid Panel No results found for: "CHOL", "TRIG", "HDL", "CHOLHDL", "LDLCALC", "LDLDIRECT"  Physical Exam:    VS:  BP 132/84   Pulse (!) 104   Ht 5\' 4"  (1.626 m)   Wt 248 lb 6.4 oz (112.7 kg)   LMP 03/15/2022   SpO2 98%   BMI 42.64 kg/m     Wt Readings from Last 3 Encounters:  10/02/22 248 lb 6.4 oz (112.7 kg)  08/20/22 243 lb 4.8 oz (110.4 kg)  07/07/22 238 lb 3.2 oz (108 kg)     GEN:  Well nourished, well developed in no acute distress HEENT: Normal NECK: No JVD; No carotid bruits LYMPHATICS: No lymphadenopathy CARDIAC: RRR,  3/6 diastolic murmurs, rubs, gallops RESPIRATORY:  Clear to auscultation without rales, wheezing or rhonchi  ABDOMEN: Soft, non-tender, non-distended MUSCULOSKELETAL:  No edema; No deformity  SKIN: Warm and dry NEUROLOGIC:  Alert and oriented x 3 PSYCHIATRIC:  Normal affect    Risk  Assessment/Risk Calculators:     CARPREG II Risk Prediction Index Score:  2.  The patient's risk for a primary cardiac event is 10%.   Modified World Health Organization [facility]) Classification of Maternal CV Risk   Class I         ASSESSMENT & PLAN:    History  of pulmonary embolism Dyspnea  Advanced maternal age Diastolic murmur Obesity in pregnancy  She is been started on Lovenox appropriately.  This is going to continue to her pregnancy due to her hypercoagulable state and history of pulmonary embolism.  Agree with that. She needs an echocardiogram which I will order  today. Weight gain should be limited to 11 to 20 pounds.   The patient is in agreement with the above plan. The patient left the office in stable condition.  The patient will follow up in 6 weeks.  Patient Instructions  Medication Instructions:  Your physician recommends that you continue on your current medications as directed. Please refer to the Current Medication list given to you today.  *If you need a refill on your cardiac medications before your next appointment, please call your pharmacy*   Lab Work: None   Testing/Procedures: Your physician has requested that you have an echocardiogram. Echocardiography is a painless test that uses sound waves to create images of your heart. It provides your doctor with information about the size and shape of your heart and how well your heart's chambers and valves are working. This procedure takes approximately one hour. There are no restrictions for this procedure. Please do NOT wear cologne, perfume, aftershave, or lotions (deodorant is allowed). Please arrive 15 minutes prior to your appointment time.    Follow-Up: At [facility], you and your health needs are our priority.  As part of our continuing mission to provide you with exceptional heart care, we have created designated Provider Care Teams.  These Care Teams include your primary Cardiologist  (physician) and Advanced Practice Providers (APPs -  Physician Assistants and Nurse Practitioners) who all work together to provide you with the care you need, when you need it.    Your next appointment:   6 week(s) [facility] or [facility]  Provider:   Berniece Salines, DO     Dispo:  No follow-ups on file.   Medication Adjustments/Labs and Tests Ordered: Current medicines are reviewed at length with the patient today.  Concerns regarding medicines are outlined above.  Tests Ordered: Orders Placed This Encounter  Procedures   ECHOCARDIOGRAM COMPLETE   Medication Changes: No orders of the defined types were placed in this encounter.

## 2022-10-02 NOTE — Patient Instructions (Addendum)
Medication Instructions:  Your physician recommends that you continue on your current medications as directed. Please refer to the Current Medication list given to you today.  *If you need a refill on your cardiac medications before your next appointment, please call your pharmacy*   Lab Work: None   Testing/Procedures: Your physician has requested that you have an echocardiogram. Echocardiography is a painless test that uses sound waves to create images of your heart. It provides your doctor with information about the size and shape of your heart and how well your heart's chambers and valves are working. This procedure takes approximately one hour. There are no restrictions for this procedure. Please do NOT wear cologne, perfume, aftershave, or lotions (deodorant is allowed). Please arrive 15 minutes prior to your appointment time.    Follow-Up: At [facility], you and your health needs are our priority.  As part of our continuing mission to provide you with exceptional heart care, we have created designated Provider Care Teams.  These Care Teams include your primary Cardiologist (physician) and Advanced Practice Providers (APPs -  Physician Assistants and Nurse Practitioners) who all work together to provide you with the care you need, when you need it.    Your next appointment:   6 week(s) [facility] or [facility]  Provider:   Berniece Salines, DO

## 2022-10-05 ENCOUNTER — Other Ambulatory Visit: Payer: Self-pay

## 2022-10-05 ENCOUNTER — Inpatient Hospital Stay: Payer: Medicaid Other | Attending: Adult Health

## 2022-10-05 DIAGNOSIS — E538 Deficiency of other specified B group vitamins: Secondary | ICD-10-CM | POA: Diagnosis present

## 2022-10-05 DIAGNOSIS — D508 Other iron deficiency anemias: Secondary | ICD-10-CM

## 2022-10-05 MED ORDER — CYANOCOBALAMIN 1000 MCG/ML IJ SOLN
1000.0000 ug | Freq: Once | INTRAMUSCULAR | Status: AC
  Administered 2022-10-05: 1000 ug via INTRAMUSCULAR

## 2022-10-15 ENCOUNTER — Other Ambulatory Visit: Payer: Self-pay | Admitting: Obstetrics and Gynecology

## 2022-10-16 ENCOUNTER — Other Ambulatory Visit: Payer: Self-pay | Admitting: Obstetrics & Gynecology

## 2022-10-16 ENCOUNTER — Other Ambulatory Visit: Payer: Self-pay | Admitting: Obstetrics and Gynecology

## 2022-10-16 DIAGNOSIS — O0993 Supervision of high risk pregnancy, unspecified, third trimester: Secondary | ICD-10-CM

## 2022-10-29 ENCOUNTER — Ambulatory Visit: Payer: Medicaid Other | Attending: Maternal & Fetal Medicine

## 2022-10-29 ENCOUNTER — Other Ambulatory Visit: Payer: Self-pay | Admitting: *Deleted

## 2022-10-29 ENCOUNTER — Ambulatory Visit (HOSPITAL_BASED_OUTPATIENT_CLINIC_OR_DEPARTMENT_OTHER): Payer: Medicaid Other

## 2022-10-29 ENCOUNTER — Ambulatory Visit: Payer: Medicaid Other | Admitting: *Deleted

## 2022-10-29 ENCOUNTER — Other Ambulatory Visit: Payer: Self-pay | Admitting: Obstetrics & Gynecology

## 2022-10-29 VITALS — BP 122/71 | HR 96

## 2022-10-29 DIAGNOSIS — Z86711 Personal history of pulmonary embolism: Secondary | ICD-10-CM

## 2022-10-29 DIAGNOSIS — O43893 Other placental disorders, third trimester: Secondary | ICD-10-CM | POA: Diagnosis not present

## 2022-10-29 DIAGNOSIS — Z3A32 32 weeks gestation of pregnancy: Secondary | ICD-10-CM

## 2022-10-29 DIAGNOSIS — E669 Obesity, unspecified: Secondary | ICD-10-CM

## 2022-10-29 DIAGNOSIS — O43213 Placenta accreta, third trimester: Secondary | ICD-10-CM

## 2022-10-29 DIAGNOSIS — O34219 Maternal care for unspecified type scar from previous cesarean delivery: Secondary | ICD-10-CM

## 2022-10-29 DIAGNOSIS — O10913 Unspecified pre-existing hypertension complicating pregnancy, third trimester: Secondary | ICD-10-CM

## 2022-10-29 DIAGNOSIS — O09523 Supervision of elderly multigravida, third trimester: Secondary | ICD-10-CM | POA: Insufficient documentation

## 2022-10-29 DIAGNOSIS — Z7901 Long term (current) use of anticoagulants: Secondary | ICD-10-CM

## 2022-10-29 DIAGNOSIS — O99213 Obesity complicating pregnancy, third trimester: Secondary | ICD-10-CM

## 2022-10-29 DIAGNOSIS — I38 Endocarditis, valve unspecified: Secondary | ICD-10-CM | POA: Diagnosis not present

## 2022-10-29 DIAGNOSIS — O10013 Pre-existing essential hypertension complicating pregnancy, third trimester: Secondary | ICD-10-CM | POA: Diagnosis not present

## 2022-10-29 DIAGNOSIS — O0943 Supervision of pregnancy with grand multiparity, third trimester: Secondary | ICD-10-CM

## 2022-10-30 ENCOUNTER — Ambulatory Visit
Admission: RE | Admit: 2022-10-30 | Discharge: 2022-10-30 | Disposition: A | Discharge status: Home / Self Care | Payer: Medicaid Other | Source: Ambulatory Visit | Attending: Obstetrics and Gynecology | Admitting: Obstetrics and Gynecology

## 2022-10-30 DIAGNOSIS — O0993 Supervision of high risk pregnancy, unspecified, third trimester: Secondary | ICD-10-CM

## 2022-10-30 HISTORY — PX: IR RADIOLOGIST EVAL & MGMT: IMG5224

## 2022-10-30 LAB — ECHOCARDIOGRAM COMPLETE
Area-P 1/2: 3.36 cm2
P 1/2 time: 447 msec
S' Lateral: 2.8 cm

## 2022-10-30 NOTE — Consult Note (Signed)
Chief Complaint: Patient was seen in consultation today for placenta accreta  Referring Physician(s): Jaymes Graff, MD  History of Present Illness: Nicole Landry is a 37 y.o. 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  Given these findings and multiparity, with prior c-section x4, her OBGYN/MFM team has recommended placement of occlusion balloons prior to c-section for hemorrhagic control. She presents today via virtual telephone visit for such accompanied by her husband.  She is currently doing well without any bleeding or abnormal discharge.  This pregnancy has been relatively uneventful.    Past Medical History:  Diagnosis Date   Blood transfusion without reported diagnosis    after ectopic   Ectopic pregnancy, tubal    Gestational diabetes mellitus 2011   denies gestational DM with current pregnancy   Iron deficiency anemia 07/08/2022   Ovarian cyst    Status post vacuum-assisted vaginal delivery (5/17) 11/20/2015    Past Surgical History:  Procedure Laterality Date   BILATERAL SALPINGECTOMY Right 02/08/2013   Procedure:  SALPINGECTOMY;  Surgeon: Antionette Char, MD;  Location: [facility];  Service: Gynecology;  Laterality: Right;   CESAREAN SECTION     CESAREAN SECTION N/A 08/18/2017   Procedure: Repeat CESAREAN SECTION;  Surgeon: Shea Evans, MD;  Location: [facility];  Service: Obstetrics;  Laterality: N/A;  EDD: 08/24/17 Allergy: Vicodin   CESAREAN SECTION N/A 04/12/2019   Procedure: Repeat CESAREAN SECTION;  Surgeon: Shea Evans, MD;  Location: [facility] ORS;  Service: Obstetrics;  Laterality: N/A;  EDD: 04/17/19 Allergy: Vicodin   CESAREAN SECTION N/A 02/21/2021   Procedure: CESAREAN SECTION;  Surgeon: Myna Hidalgo, DO;  Location: [facility];  Service: Obstetrics;  Laterality: N/A;   DILATION AND CURETTAGE OF UTERUS     LAPAROSCOPY N/A 02/08/2013   Procedure:  LAPAROSCOPY OPERATIVE;  Surgeon: Antionette Char, MD;  Location: [facility];  Service: Gynecology;  Laterality: N/A;   SALPINGECTOMY Right 02/2013   op note in epic    Allergies: Food and Vicodin [hydrocodone-acetaminophen]  Medications: Prior to Admission medications   Medication Sig Start Date End Date Taking? Authorizing Provider  cholecalciferol (VITAMIN D3) 25 MCG (1000 UNIT) tablet Take 1,000 Units by mouth daily.    [provider]  Cyanocobalamin (B-12 COMPLIANCE INJECTION) 1000 MCG/ML KIT Inject as directed.    [provider]  enoxaparin (LOVENOX) 100 MG/ML injection Inject 100 mg into the skin daily.    [provider]  Prenatal Vit-Fe Fumarate-FA (PRENATAL MULTIVITAMIN) TABS tablet Take 1 tablet by mouth daily at 12 noon.    [provider]     Family History  Problem Relation Age of Onset   Cancer Maternal Grandmother        unknown cancer in her 89s   Asthma Neg Hx    Depression Neg Hx    Diabetes Neg Hx    Heart disease Neg Hx    Hypertension Neg Hx    Obesity Neg Hx    Miscarriages / Stillbirths Neg Hx     Social History   Socioeconomic History   Marital status: Married    Spouse name: Not on file   Number of children: Not on file   Years of education: Not on file   Highest education level: Not on file  Occupational History   Not on file  Tobacco Use   Smoking status: Never   Smokeless tobacco: Never  Vaping Use   Vaping Use:  Never used  Substance and Sexual Activity   Alcohol use: No   Drug use: No   Sexual activity: Yes    Birth control/protection: None  Other Topics Concern   Not on file  Social History Narrative   ** Merged History Encounter **       Social Determinants of Health   Financial Resource Strain: Low Risk  (03/28/2019)   Overall Financial Resource Strain (CARDIA)    Difficulty of Paying Living Expenses: Not hard at all  Food Insecurity: No Food Insecurity (03/06/2021)   Hunger Vital Sign     Worried About Running Out of Food in the Last Year: Never true    Ran Out of Food in the Last Year: Never true  Transportation Needs: No Transportation Needs (03/06/2021)   PRAPARE - Administrator, Civil Service (Medical): No    Lack of Transportation (Non-Medical): No  Physical Activity: Not on file  Stress: Not on file  Social Connections: Not on file   Review of Systems: A 12 point ROS discussed and pertinent positives are indicated in the HPI above.  All other systems are negative.  Vital Signs: LMP 03/15/2022   No physical examination was performed in lieu of virtual telephone clinic visit.   Imaging: 10/29/22 - OB US Comments  The patient is here for a follow-up ultrasound at 32w 4d for prior CD x4, AMA gDMA1, CHTN. EDD: 12/20/2022 dated by LMP (03/15/22). She has no concerns today and reports good blood sugar control  Sonographic findings Single intrauterine pregnancy. Fetal cardiac activity: Observed and appears normal. Presentation: Cephalic. Interval fetal anatomy appears normal. Fetal biometry shows the estimated fetal weight at the 86 percentile. Amniotic fluid volume: Within normal limits. MVP: 7.36 cm. Placenta: Anterior. Multiple placental lakes have been noted but the retroplacental spaces appears normal. Low concern for PAS at this time.  Recommendations 1. Serial growth ultrasounds every 4 weeks until delivery 2. Antenatal testing to start around 34 weeks due to multiple medical comorbidities 3. Delivery around [redacted] weeks gestation - currently scheduled for 36 weeks, consider rescheduling to 37 weeks unless the patient has significant labor pain or other concerns. 4. Hold Lovenox 24 hours prior to delivery  ---------------------------------------------------------------------- Braxton Feathers, DO Electronically Signed Final Report 10/29/2022 10:22 am ----------------------------------------------------------------------    Labs:  CBC: Recent Labs    07/07/22 1551 08/20/22 1229  WBC 9.0 6.7  HGB 9.1* 11.0*  HCT 30.7* 34.2*  35.3  PLT 431* 298    COAGS: No results for input(s): "INR", "APTT" in the last 8760 hours.  BMP: Recent Labs    07/07/22 1551 08/20/22 1229  NA 137 138  K 3.6 3.8  CL 103 104  CO2 25 26  GLUCOSE 74 97  BUN 6 5*  CALCIUM 10.0 9.0  CREATININE 0.45 0.39*  GFRNONAA >60 >60    LIVER FUNCTION TESTS: Recent Labs    07/07/22 1551 08/20/22 1229  BILITOT 0.3 0.2*  AST 10* 8*  ALT 6 6  ALKPHOS 44 43  PROT 7.7 6.8  ALBUMIN 3.9 3.8    TUMOR MARKERS: No results for input(s): "AFPTM", "CEA", "CA199", "CHROMGRNA" in the last 8760 hours.  Assessment and Plan: Nicole Landry is a 37 y.o. 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  We discussed the possibility of IR involvement for possible placement of internal iliac occlusion balloons at the time of planned c-section for hemorrhagic control, and  she is amenable.    I have reached out to Dr. Normand Sloop and am awaiting exact date and time for planned c-section which has been rescheduled from initial date of 11/23/22.  Once this is obtained, I will coordinate to make sure we have all supplies/staff needed to support.   Electronically Signed: Bennie Dallas, MD 10/30/2022, 12:44 PM   I spent a total of  30 Minutes  in virtual telephone clinical consultation, greater than 50% of which was counseling/coordinating care for placenta accreta.

## 2022-11-02 ENCOUNTER — Other Ambulatory Visit: Payer: Self-pay

## 2022-11-02 ENCOUNTER — Inpatient Hospital Stay: Payer: Medicaid Other

## 2022-11-02 MED ORDER — CYANOCOBALAMIN 1000 MCG/ML IJ SOLN: 1000.0000 ug | Freq: Once | INTRAMUSCULAR | Status: AC

## 2022-11-03 ENCOUNTER — Other Ambulatory Visit (HOSPITAL_COMMUNITY): Payer: Self-pay | Admitting: Interventional Radiology

## 2022-11-03 DIAGNOSIS — O43219 Placenta accreta, unspecified trimester: Secondary | ICD-10-CM

## 2022-11-10 ENCOUNTER — Encounter: Payer: Self-pay | Admitting: *Deleted

## 2022-11-11 ENCOUNTER — Other Ambulatory Visit: Payer: Self-pay

## 2022-11-11 ENCOUNTER — Ambulatory Visit: Payer: Medicaid Other | Attending: Maternal & Fetal Medicine

## 2022-11-11 ENCOUNTER — Ambulatory Visit: Payer: Medicaid Other | Admitting: *Deleted

## 2022-11-11 VITALS — BP 144/76 | HR 114

## 2022-11-11 DIAGNOSIS — D508 Other iron deficiency anemias: Secondary | ICD-10-CM

## 2022-11-11 DIAGNOSIS — O24419 Gestational diabetes mellitus in pregnancy, unspecified control: Secondary | ICD-10-CM | POA: Insufficient documentation

## 2022-11-11 DIAGNOSIS — E669 Obesity, unspecified: Secondary | ICD-10-CM

## 2022-11-11 DIAGNOSIS — Z7901 Long term (current) use of anticoagulants: Secondary | ICD-10-CM

## 2022-11-11 DIAGNOSIS — O09523 Supervision of elderly multigravida, third trimester: Secondary | ICD-10-CM

## 2022-11-11 DIAGNOSIS — O0943 Supervision of pregnancy with grand multiparity, third trimester: Secondary | ICD-10-CM

## 2022-11-11 DIAGNOSIS — O43893 Other placental disorders, third trimester: Secondary | ICD-10-CM

## 2022-11-11 DIAGNOSIS — Z86711 Personal history of pulmonary embolism: Secondary | ICD-10-CM

## 2022-11-11 DIAGNOSIS — O34219 Maternal care for unspecified type scar from previous cesarean delivery: Secondary | ICD-10-CM | POA: Diagnosis present

## 2022-11-11 DIAGNOSIS — O10913 Unspecified pre-existing hypertension complicating pregnancy, third trimester: Secondary | ICD-10-CM

## 2022-11-11 DIAGNOSIS — I2699 Other pulmonary embolism without acute cor pulmonale: Secondary | ICD-10-CM

## 2022-11-11 DIAGNOSIS — O10013 Pre-existing essential hypertension complicating pregnancy, third trimester: Secondary | ICD-10-CM

## 2022-11-11 DIAGNOSIS — Z3A34 34 weeks gestation of pregnancy: Secondary | ICD-10-CM

## 2022-11-11 DIAGNOSIS — O99213 Obesity complicating pregnancy, third trimester: Secondary | ICD-10-CM | POA: Diagnosis present

## 2022-11-12 ENCOUNTER — Inpatient Hospital Stay: Payer: Medicaid Other

## 2022-11-12 ENCOUNTER — Inpatient Hospital Stay: Payer: Medicaid Other | Attending: Adult Health | Admitting: Adult Health

## 2022-11-12 DIAGNOSIS — D508 Other iron deficiency anemias: Secondary | ICD-10-CM

## 2022-11-12 NOTE — Progress Notes (Signed)
Patient did not show for her appointment for labs and follow-up of her iron deficiency anemia.  She spoke with my nurse who she told she would call us after she has her baby later this month.  Lillard Anes, NP 11/12/22 12:20 PM Medical Oncology and Hematology [facility] [address] [place], [place] [address] Tel. [phone]    Fax. [phone]

## 2022-11-13 ENCOUNTER — Ambulatory Visit: Payer: Medicaid Other | Admitting: Cardiology

## 2022-11-15 ENCOUNTER — Other Ambulatory Visit: Payer: Medicaid Other

## 2022-11-15 ENCOUNTER — Inpatient Hospital Stay: Admission: RE | Admit: 2022-11-15 | Payer: Medicaid Other | Source: Ambulatory Visit

## 2022-11-16 ENCOUNTER — Encounter (HOSPITAL_COMMUNITY): Payer: Self-pay

## 2022-11-16 NOTE — Patient Instructions (Addendum)
Nicole Landry  11/16/2022   Your procedure is scheduled on:  11/23/2022  Arrive at 12:00 PMat Entrance C on [address] at [facility]  and [employer]. You are invited to use the FREE valet parking or use the Visitor's parking deck.  Pick up the phone at the desk and dial [phone].  Call this number if you have problems the morning of surgery: [phone]  Remember:   Do not eat food:(After Midnight) Desps de medianoche.  Do not drink clear liquids: (4 Hours before arrival) 4 horas ante llegada. 0900  Take these medicines the morning of surgery with A SIP OF WATER:  No medications on day of surgery and no lovenox at all the day before surgery or on the day of surgery   Do not wear jewelry, make-up or nail polish.  Do not wear lotions, powders, or perfumes. Do not wear deodorant.  Do not shave 48 hours prior to surgery.  Do not bring valuables to the hospital.  [facility] is not   responsible for any belongings or valuables brought to the hospital.  Contacts, dentures or bridgework may not be worn into surgery.  Leave suitcase in the car. After surgery it may be brought to your room.  For patients admitted to the hospital, checkout time is 11:00 AM the day of              discharge.      Please read over the following fact sheets that you were given:     Preparing for Surgery

## 2022-11-17 ENCOUNTER — Other Ambulatory Visit: Payer: Self-pay | Admitting: Obstetrics and Gynecology

## 2022-11-17 ENCOUNTER — Encounter (HOSPITAL_COMMUNITY): Payer: Self-pay

## 2022-11-18 ENCOUNTER — Ambulatory Visit: Payer: Medicaid Other | Attending: Maternal & Fetal Medicine

## 2022-11-18 ENCOUNTER — Ambulatory Visit: Payer: Medicaid Other

## 2022-11-20 ENCOUNTER — Inpatient Hospital Stay (HOSPITAL_COMMUNITY)
Admission: AD | Admit: 2022-11-20 | Discharge: 2022-11-20 | Disposition: A | Discharge status: Home / Self Care | Payer: Medicaid Other | Attending: Obstetrics and Gynecology | Admitting: Obstetrics and Gynecology

## 2022-11-20 ENCOUNTER — Other Ambulatory Visit: Payer: Self-pay | Admitting: Student

## 2022-11-20 ENCOUNTER — Encounter (HOSPITAL_COMMUNITY)
Admission: RE | Admit: 2022-11-20 | Discharge: 2022-11-20 | Disposition: A | Discharge status: Home / Self Care | Payer: Medicaid Other | Source: Ambulatory Visit | Attending: Obstetrics & Gynecology | Admitting: Obstetrics & Gynecology

## 2022-11-20 DIAGNOSIS — Z3493 Encounter for supervision of normal pregnancy, unspecified, third trimester: Secondary | ICD-10-CM | POA: Diagnosis present

## 2022-11-20 DIAGNOSIS — Z3A35 35 weeks gestation of pregnancy: Secondary | ICD-10-CM | POA: Insufficient documentation

## 2022-11-20 DIAGNOSIS — D508 Other iron deficiency anemias: Secondary | ICD-10-CM

## 2022-11-20 DIAGNOSIS — Z01812 Encounter for preprocedural laboratory examination: Secondary | ICD-10-CM | POA: Insufficient documentation

## 2022-11-20 LAB — CBC
HCT: 37.4 % (ref 36.0–46.0)
Hemoglobin: 11.8 g/dL — ABNORMAL LOW (ref 12.0–15.0)
MCH: 27.6 pg (ref 26.0–34.0)
MCHC: 31.6 g/dL (ref 30.0–36.0)
MCV: 87.4 fL (ref 80.0–100.0)
Platelets: 245 10*3/uL (ref 150–400)
RBC: 4.28 MIL/uL (ref 3.87–5.11)
RDW: 14.9 % (ref 11.5–15.5)
WBC: 6 10*3/uL (ref 4.0–10.5)
nRBC: 0 % (ref 0.0–0.2)

## 2022-11-20 LAB — TYPE AND SCREEN
ABO/RH(D): O POS
Antibody Screen: NEGATIVE

## 2022-11-20 MED ORDER — BETAMETHASONE SOD PHOS & ACET 6 (3-3) MG/ML IJ SUSP
12.0000 mg | Freq: Once | INTRAMUSCULAR | Status: AC
  Administered 2022-11-20: 12 mg via INTRAMUSCULAR
  Filled 2022-11-20: qty 5

## 2022-11-20 NOTE — MAU Provider Note (Signed)
Event Date/Time   First Provider Initiated Contact with Patient 11/20/22 1043      S [name] is a 37 y.o. [id number] patient who presents to MAU today for Betamethsone injection. Patient is without complaint. She states [facility] is planning an early cesarean due to diagnosis of suspected placenta accreta.   O BP 113/64   Pulse 93   Temp 98.2 F (36.8 C)   Resp 18   Ht 5\' 3"  (1.6 m)   Wt 112.5 kg   LMP 03/15/2022   BMI 43.94 kg/m   Physical Exam Vitals and nursing note reviewed.  Constitutional:      Appearance: Normal appearance. She is not ill-appearing.  Cardiovascular:     Rate and Rhythm: Normal rate.  Pulmonary:     Effort: Pulmonary effort is normal.  Abdominal:     Comments: Gravid  Neurological:     Mental Status: She is alert.     A Medical screening exam complete Meds ordered this encounter  Medications   betamethasone acetate-betamethasone sodium phosphate (CELESTONE) injection 12 mg     P Discharge from MAU in stable condition Patient may return to MAU as needed   Calvert Cantor, [place] 11/20/22 3:14 PM

## 2022-11-20 NOTE — MAU Note (Signed)
Pt sent from [facility] for betamethasone injection. Scheduled for early deliver for  placenta accreta next week. Reports good fetal movement and denies any vag bleeding or leaking or ctx.

## 2022-11-20 NOTE — Discharge Instructions (Signed)

## 2022-11-21 ENCOUNTER — Encounter: Payer: Self-pay | Admitting: Obstetrics and Gynecology

## 2022-11-21 ENCOUNTER — Other Ambulatory Visit: Payer: Self-pay

## 2022-11-21 ENCOUNTER — Encounter (HOSPITAL_COMMUNITY): Payer: Self-pay | Admitting: Obstetrics and Gynecology

## 2022-11-21 ENCOUNTER — Inpatient Hospital Stay (HOSPITAL_COMMUNITY)
Admission: AD | Admit: 2022-11-21 | Discharge: 2022-11-21 | Disposition: A | Discharge status: Home / Self Care | Payer: Medicaid Other | Attending: Obstetrics and Gynecology | Admitting: Obstetrics and Gynecology

## 2022-11-21 DIAGNOSIS — O99213 Obesity complicating pregnancy, third trimester: Secondary | ICD-10-CM | POA: Diagnosis not present

## 2022-11-21 DIAGNOSIS — Z3A36 36 weeks gestation of pregnancy: Secondary | ICD-10-CM | POA: Insufficient documentation

## 2022-11-21 DIAGNOSIS — O09523 Supervision of elderly multigravida, third trimester: Secondary | ICD-10-CM | POA: Diagnosis present

## 2022-11-21 DIAGNOSIS — O0943 Supervision of pregnancy with grand multiparity, third trimester: Secondary | ICD-10-CM | POA: Diagnosis present

## 2022-11-21 DIAGNOSIS — O0993 Supervision of high risk pregnancy, unspecified, third trimester: Secondary | ICD-10-CM

## 2022-11-21 LAB — RPR: RPR Ser Ql: NONREACTIVE

## 2022-11-21 MED ORDER — BETAMETHASONE SOD PHOS & ACET 6 (3-3) MG/ML IJ SUSP
12.0000 mg | Freq: Once | INTRAMUSCULAR | Status: AC
  Administered 2022-11-21: 12 mg via INTRAMUSCULAR

## 2022-11-21 NOTE — MAU Note (Signed)
Nicole Landry is a 37 y.o. at [redacted]w[redacted]d here in MAU reporting: she' here for betamethasone injection.  Denies any other complaints.  Endorses +FM. LMP: NA Onset of complaint: NA Pain score: 0 Vitals:   11/21/22 1007  BP: 135/83  Pulse: 99  Resp: 18  Temp: 98.2 F (36.8 C)  SpO2: 98%     FHT:137 bpm Lab orders placed from triage:   None

## 2022-11-21 NOTE — MAU Provider Note (Signed)
Event Date/Time   First Provider Initiated Contact with Patient 11/21/22 1017      S [name] is a 37 y.o. [id number] patient who presents to MAU today with complaint of for 2nd BMZ injection. She is scheduled to have a cesarean delivery d/t possible placental accreta. She has no complaints at this time.   O BP 135/83 (BP Location: Right Arm)   Pulse 99   Temp 98.2 F (36.8 C) (Oral)   Resp 18   Ht 5\' 3"  (1.6 m)   Wt 113 kg   LMP 03/15/2022   SpO2 98%   BMI 44.13 kg/m   Physical Exam Vitals and nursing note reviewed.  Constitutional:      Appearance: Normal appearance. She is obese.  Cardiovascular:     Rate and Rhythm: Normal rate.  Pulmonary:     Effort: Pulmonary effort is normal.  Neurological:     Mental Status: She is alert.  Psychiatric:        Mood and Affect: Mood normal.        Behavior: Behavior normal.        Thought Content: Thought content normal.        Judgment: Judgment normal.    A Medical screening exam complete High-risk pregnancy in third trimester [redacted] weeks gestation of pregnancy   P Discharge from MAU in stable condition Information provided on signs of labor  Patient may return to MAU as needed   Raelyn Mora, CNM 11/21/2022 10:18 AM

## 2022-11-23 ENCOUNTER — Inpatient Hospital Stay (HOSPITAL_COMMUNITY): Payer: Medicaid Other

## 2022-11-23 ENCOUNTER — Other Ambulatory Visit: Payer: Self-pay

## 2022-11-23 ENCOUNTER — Encounter (HOSPITAL_COMMUNITY): Payer: Self-pay | Admitting: Obstetrics & Gynecology

## 2022-11-23 ENCOUNTER — Inpatient Hospital Stay (HOSPITAL_COMMUNITY): Payer: Medicaid Other | Admitting: Anesthesiology

## 2022-11-23 ENCOUNTER — Inpatient Hospital Stay (HOSPITAL_COMMUNITY)
Admission: RE | Admit: 2022-11-23 | Discharge: 2022-11-23 | Disposition: A | Discharge status: Home / Self Care | Payer: Medicaid Other | Source: Ambulatory Visit | Attending: Interventional Radiology | Admitting: Interventional Radiology

## 2022-11-23 ENCOUNTER — Encounter: Payer: Self-pay | Admitting: Adult Health

## 2022-11-23 ENCOUNTER — Encounter (HOSPITAL_COMMUNITY)
Admission: AD | Disposition: A | Discharge status: Home / Self Care | Payer: Self-pay | Source: Home / Self Care | Attending: Obstetrics & Gynecology

## 2022-11-23 ENCOUNTER — Inpatient Hospital Stay (HOSPITAL_COMMUNITY)
Admission: AD | Admit: 2022-11-23 | Discharge: 2022-11-28 | DRG: 784 | Disposition: A | Discharge status: Home / Self Care | Payer: Medicaid Other | Attending: Obstetrics & Gynecology | Admitting: Obstetrics & Gynecology

## 2022-11-23 DIAGNOSIS — O3413 Maternal care for benign tumor of corpus uteri, third trimester: Secondary | ICD-10-CM | POA: Diagnosis present

## 2022-11-23 DIAGNOSIS — O43219 Placenta accreta, unspecified trimester: Secondary | ICD-10-CM

## 2022-11-23 DIAGNOSIS — Z86711 Personal history of pulmonary embolism: Secondary | ICD-10-CM

## 2022-11-23 DIAGNOSIS — Z9851 Tubal ligation status: Secondary | ICD-10-CM

## 2022-11-23 DIAGNOSIS — O139 Gestational [pregnancy-induced] hypertension without significant proteinuria, unspecified trimester: Secondary | ICD-10-CM | POA: Diagnosis present

## 2022-11-23 DIAGNOSIS — O24419 Gestational diabetes mellitus in pregnancy, unspecified control: Secondary | ICD-10-CM

## 2022-11-23 DIAGNOSIS — Z7901 Long term (current) use of anticoagulants: Secondary | ICD-10-CM | POA: Diagnosis not present

## 2022-11-23 DIAGNOSIS — M545 Low back pain, unspecified: Secondary | ICD-10-CM | POA: Diagnosis not present

## 2022-11-23 DIAGNOSIS — O9902 Anemia complicating childbirth: Secondary | ICD-10-CM | POA: Diagnosis present

## 2022-11-23 DIAGNOSIS — O1092 Unspecified pre-existing hypertension complicating childbirth: Secondary | ICD-10-CM | POA: Diagnosis present

## 2022-11-23 DIAGNOSIS — O26833 Pregnancy related renal disease, third trimester: Secondary | ICD-10-CM | POA: Diagnosis present

## 2022-11-23 DIAGNOSIS — O99824 Streptococcus B carrier state complicating childbirth: Secondary | ICD-10-CM | POA: Diagnosis present

## 2022-11-23 DIAGNOSIS — D252 Subserosal leiomyoma of uterus: Secondary | ICD-10-CM | POA: Diagnosis present

## 2022-11-23 DIAGNOSIS — Z3A36 36 weeks gestation of pregnancy: Secondary | ICD-10-CM

## 2022-11-23 DIAGNOSIS — Z302 Encounter for sterilization: Secondary | ICD-10-CM | POA: Diagnosis not present

## 2022-11-23 DIAGNOSIS — D509 Iron deficiency anemia, unspecified: Secondary | ICD-10-CM | POA: Diagnosis present

## 2022-11-23 DIAGNOSIS — I10 Essential (primary) hypertension: Secondary | ICD-10-CM | POA: Diagnosis present

## 2022-11-23 DIAGNOSIS — N200 Calculus of kidney: Secondary | ICD-10-CM | POA: Diagnosis present

## 2022-11-23 DIAGNOSIS — O99214 Obesity complicating childbirth: Secondary | ICD-10-CM

## 2022-11-23 DIAGNOSIS — O24429 Gestational diabetes mellitus in childbirth, unspecified control: Secondary | ICD-10-CM | POA: Diagnosis not present

## 2022-11-23 DIAGNOSIS — Z98891 History of uterine scar from previous surgery: Secondary | ICD-10-CM

## 2022-11-23 DIAGNOSIS — O34211 Maternal care for low transverse scar from previous cesarean delivery: Secondary | ICD-10-CM | POA: Diagnosis present

## 2022-11-23 DIAGNOSIS — O43213 Placenta accreta, third trimester: Secondary | ICD-10-CM | POA: Diagnosis present

## 2022-11-23 DIAGNOSIS — D508 Other iron deficiency anemias: Secondary | ICD-10-CM

## 2022-11-23 HISTORY — PX: IR US GUIDE VASC ACCESS RIGHT: IMG2390

## 2022-11-23 HISTORY — PX: IR US GUIDE VASC ACCESS LEFT: IMG2389

## 2022-11-23 HISTORY — PX: IR ANGIOGRAM PELVIS SELECTIVE OR SUPRASELECTIVE: IMG661

## 2022-11-23 HISTORY — PX: IR HYBRID TRAUMA EMBOLIZATION: IMG5539

## 2022-11-23 LAB — BPAM RBC
Blood Product Expiration Date: 202406212359
ISSUE DATE / TIME: 202405201459
Unit Type and Rh: 5100

## 2022-11-23 LAB — CBC
HCT: 34.9 % — ABNORMAL LOW (ref 36.0–46.0)
Hemoglobin: 11 g/dL — ABNORMAL LOW (ref 12.0–15.0)
MCH: 27.6 pg (ref 26.0–34.0)
MCHC: 31.5 g/dL (ref 30.0–36.0)
MCV: 87.7 fL (ref 80.0–100.0)
Platelets: 229 10*3/uL (ref 150–400)
RBC: 3.98 MIL/uL (ref 3.87–5.11)
RDW: 15 % (ref 11.5–15.5)
WBC: 7.4 10*3/uL (ref 4.0–10.5)
nRBC: 0.3 % — ABNORMAL HIGH (ref 0.0–0.2)

## 2022-11-23 LAB — TYPE AND SCREEN
Unit division: 0
Unit division: 0

## 2022-11-23 LAB — PROTIME-INR
INR: 1.2 (ref 0.8–1.2)
Prothrombin Time: 15.1 seconds (ref 11.4–15.2)

## 2022-11-23 LAB — GLUCOSE, CAPILLARY
Glucose-Capillary: 84 mg/dL (ref 70–99)
Glucose-Capillary: 103 mg/dL — ABNORMAL HIGH (ref 70–99)

## 2022-11-23 LAB — PREPARE RBC (CROSSMATCH)

## 2022-11-23 SURGERY — Surgical Case
Anesthesia: Spinal

## 2022-11-23 MED ORDER — SIMETHICONE 80 MG PO CHEW: 80.0000 mg | CHEWABLE_TABLET | ORAL | Status: DC | PRN

## 2022-11-23 MED ORDER — PHENYLEPHRINE HCL-NACL 20-0.9 MG/250ML-% IV SOLN
INTRAVENOUS | Status: DC | PRN
  Administered 2022-11-23: 60 ug/min via INTRAVENOUS

## 2022-11-23 MED ORDER — METOCLOPRAMIDE HCL 5 MG/ML IJ SOLN
INTRAMUSCULAR | Status: AC
  Filled 2022-11-23: qty 2

## 2022-11-23 MED ORDER — ENOXAPARIN SODIUM 100 MG/ML IJ SOSY
100.0000 mg | PREFILLED_SYRINGE | INTRAMUSCULAR | Status: DC
  Administered 2022-11-24 – 2022-11-27 (×4): 100 mg via SUBCUTANEOUS
  Filled 2022-11-23 (×7): qty 1

## 2022-11-23 MED ORDER — MIDAZOLAM HCL 2 MG/2ML IJ SOLN
INTRAMUSCULAR | Status: DC | PRN
  Administered 2022-11-23 (×2): 1 mg via INTRAVENOUS

## 2022-11-23 MED ORDER — CHLORHEXIDINE GLUCONATE 0.12 % MT SOLN
15.0000 mL | Freq: Once | OROMUCOSAL | Status: AC
  Administered 2022-11-23: 15 mL via OROMUCOSAL

## 2022-11-23 MED ORDER — MEPERIDINE HCL 25 MG/ML IJ SOLN: 6.2500 mg | INTRAMUSCULAR | Status: DC | PRN

## 2022-11-23 MED ORDER — DIPHENHYDRAMINE HCL 25 MG PO CAPS: 25.0000 mg | ORAL_CAPSULE | Freq: Four times a day (QID) | ORAL | Status: DC | PRN

## 2022-11-23 MED ORDER — OXYTOCIN-SODIUM CHLORIDE 30-0.9 UT/500ML-% IV SOLN
INTRAVENOUS | Status: AC
  Filled 2022-11-23: qty 500

## 2022-11-23 MED ORDER — KETOROLAC TROMETHAMINE 30 MG/ML IJ SOLN: 30.0000 mg | Freq: Four times a day (QID) | INTRAMUSCULAR | Status: DC | PRN

## 2022-11-23 MED ORDER — LACTATED RINGERS IV SOLN: INTRAVENOUS | Status: DC

## 2022-11-23 MED ORDER — ACETAMINOPHEN 500 MG PO TABS
1000.0000 mg | ORAL_TABLET | Freq: Four times a day (QID) | ORAL | Status: DC
  Administered 2022-11-24 – 2022-11-28 (×17): 1000 mg via ORAL
  Filled 2022-11-23 (×19): qty 2

## 2022-11-23 MED ORDER — METHYLENE BLUE 1 % INJ SOLN
INTRAVENOUS | Status: AC
  Filled 2022-11-23: qty 10

## 2022-11-23 MED ORDER — ZOLPIDEM TARTRATE 5 MG PO TABS: 5.0000 mg | ORAL_TABLET | Freq: Every evening | ORAL | Status: DC | PRN

## 2022-11-23 MED ORDER — FAMOTIDINE 20 MG PO TABS: 20.0000 mg | ORAL_TABLET | Freq: Once | ORAL | Status: DC

## 2022-11-23 MED ORDER — PSEUDOEPHEDRINE HCL ER 120 MG PO TB12
120.0000 mg | ORAL_TABLET | Freq: Two times a day (BID) | ORAL | Status: DC
  Administered 2022-11-28: 120 mg via ORAL
  Filled 2022-11-23 (×12): qty 1

## 2022-11-23 MED ORDER — ACETAMINOPHEN 10 MG/ML IV SOLN
INTRAVENOUS | Status: AC
  Filled 2022-11-23: qty 100

## 2022-11-23 MED ORDER — GELATIN ABSORBABLE 12-7 MM EX MISC
CUTANEOUS | Status: AC
  Filled 2022-11-23: qty 1

## 2022-11-23 MED ORDER — SODIUM CHLORIDE 0.9 % IR SOLN
Status: DC | PRN
  Administered 2022-11-23: 1000 mL

## 2022-11-23 MED ORDER — BUPIVACAINE HCL 0.25 % IJ SOLN
INTRAMUSCULAR | Status: DC | PRN
  Administered 2022-11-23: 30 mL

## 2022-11-23 MED ORDER — MENTHOL 3 MG MT LOZG: 1.0000 | LOZENGE | OROMUCOSAL | Status: DC | PRN

## 2022-11-23 MED ORDER — DIPHENHYDRAMINE HCL 50 MG/ML IJ SOLN
INTRAMUSCULAR | Status: DC | PRN
  Administered 2022-11-23: 25 mg via INTRAVENOUS

## 2022-11-23 MED ORDER — FENTANYL CITRATE (PF) 100 MCG/2ML IJ SOLN
INTRAMUSCULAR | Status: DC | PRN
  Administered 2022-11-23: 85 ug via INTRAVENOUS
  Administered 2022-11-23 (×5): 50 ug via INTRAVENOUS

## 2022-11-23 MED ORDER — DIPHENHYDRAMINE HCL 50 MG/ML IJ SOLN
INTRAMUSCULAR | Status: AC
  Filled 2022-11-23: qty 1

## 2022-11-23 MED ORDER — SODIUM CHLORIDE 0.9 % IV SOLN: INTRAVENOUS | Status: DC | PRN

## 2022-11-23 MED ORDER — COCONUT OIL OIL: 1.0000 | TOPICAL_OIL | Status: DC | PRN

## 2022-11-23 MED ORDER — MORPHINE SULFATE (PF) 0.5 MG/ML IJ SOLN
INTRAMUSCULAR | Status: AC
  Filled 2022-11-23: qty 10

## 2022-11-23 MED ORDER — DIPHENHYDRAMINE HCL 25 MG PO CAPS: 25.0000 mg | ORAL_CAPSULE | ORAL | Status: DC | PRN

## 2022-11-23 MED ORDER — FENTANYL CITRATE (PF) 100 MCG/2ML IJ SOLN: 25.0000 ug | INTRAMUSCULAR | Status: DC | PRN

## 2022-11-23 MED ORDER — NALOXONE HCL 0.4 MG/ML IJ SOLN: 0.4000 mg | INTRAMUSCULAR | Status: DC | PRN

## 2022-11-23 MED ORDER — SODIUM CHLORIDE 0.9% FLUSH: 3.0000 mL | INTRAVENOUS | Status: DC | PRN

## 2022-11-23 MED ORDER — SCOPOLAMINE 1 MG/3DAYS TD PT72: 1.0000 | MEDICATED_PATCH | Freq: Once | TRANSDERMAL | Status: DC

## 2022-11-23 MED ORDER — MIDAZOLAM HCL 2 MG/2ML IJ SOLN
INTRAMUSCULAR | Status: AC
  Filled 2022-11-23: qty 2

## 2022-11-23 MED ORDER — BUPIVACAINE HCL (PF) 0.25 % IJ SOLN
INTRAMUSCULAR | Status: AC
  Filled 2022-11-23: qty 30

## 2022-11-23 MED ORDER — CHLORHEXIDINE GLUCONATE 0.12 % MT SOLN
OROMUCOSAL | Status: AC
  Filled 2022-11-23: qty 15

## 2022-11-23 MED ORDER — SENNOSIDES-DOCUSATE SODIUM 8.6-50 MG PO TABS
2.0000 | ORAL_TABLET | Freq: Every day | ORAL | Status: DC
  Administered 2022-11-24 – 2022-11-28 (×5): 2 via ORAL
  Filled 2022-11-23 (×5): qty 2

## 2022-11-23 MED ORDER — SODIUM CHLORIDE 0.9% IV SOLUTION: Freq: Once | INTRAVENOUS | Status: DC

## 2022-11-23 MED ORDER — KETOROLAC TROMETHAMINE 30 MG/ML IJ SOLN
30.0000 mg | Freq: Four times a day (QID) | INTRAMUSCULAR | Status: AC
  Administered 2022-11-24 (×3): 30 mg via INTRAVENOUS
  Filled 2022-11-23 (×3): qty 1

## 2022-11-23 MED ORDER — KETOROLAC TROMETHAMINE 30 MG/ML IJ SOLN
INTRAMUSCULAR | Status: DC | PRN
  Administered 2022-11-23: 30 mg via INTRAVENOUS

## 2022-11-23 MED ORDER — SCOPOLAMINE 1 MG/3DAYS TD PT72
MEDICATED_PATCH | TRANSDERMAL | Status: AC
  Filled 2022-11-23: qty 1

## 2022-11-23 MED ORDER — OXYCODONE HCL 5 MG PO TABS
5.0000 mg | ORAL_TABLET | ORAL | Status: DC | PRN
  Administered 2022-11-24: 5 mg via ORAL
  Administered 2022-11-25 – 2022-11-27 (×5): 10 mg via ORAL
  Administered 2022-11-28: 5 mg via ORAL
  Administered 2022-11-28: 10 mg via ORAL
  Filled 2022-11-23: qty 1
  Filled 2022-11-23 (×4): qty 2
  Filled 2022-11-23: qty 1
  Filled 2022-11-23 (×2): qty 2

## 2022-11-23 MED ORDER — BUPIVACAINE HCL (PF) 0.5 % IJ SOLN
INTRAMUSCULAR | Status: AC
  Filled 2022-11-23: qty 30

## 2022-11-23 MED ORDER — FAMOTIDINE 20 MG PO TABS
ORAL_TABLET | ORAL | Status: AC
  Filled 2022-11-23: qty 1

## 2022-11-23 MED ORDER — METOCLOPRAMIDE HCL 5 MG/ML IJ SOLN
INTRAMUSCULAR | Status: DC | PRN
  Administered 2022-11-23: 10 mg via INTRAVENOUS

## 2022-11-23 MED ORDER — WITCH HAZEL-GLYCERIN EX PADS: 1.0000 | MEDICATED_PAD | CUTANEOUS | Status: DC | PRN

## 2022-11-23 MED ORDER — IBUPROFEN 600 MG PO TABS
600.0000 mg | ORAL_TABLET | Freq: Four times a day (QID) | ORAL | Status: DC
  Administered 2022-11-24 – 2022-11-28 (×16): 600 mg via ORAL
  Filled 2022-11-23 (×17): qty 1

## 2022-11-23 MED ORDER — HYDROMORPHONE HCL 1 MG/ML IJ SOLN: 0.2000 mg | INTRAMUSCULAR | Status: DC | PRN

## 2022-11-23 MED ORDER — ONDANSETRON HCL 4 MG/2ML IJ SOLN
INTRAMUSCULAR | Status: AC
  Filled 2022-11-23: qty 2

## 2022-11-23 MED ORDER — CEFAZOLIN SODIUM-DEXTROSE 2-4 GM/100ML-% IV SOLN
2.0000 g | Freq: Once | INTRAVENOUS | Status: AC
  Administered 2022-11-23: 2 g via INTRAVENOUS

## 2022-11-23 MED ORDER — DIBUCAINE (PERIANAL) 1 % EX OINT: 1.0000 | TOPICAL_OINTMENT | CUTANEOUS | Status: DC | PRN

## 2022-11-23 MED ORDER — DEXMEDETOMIDINE HCL IN NACL 80 MCG/20ML IV SOLN
INTRAVENOUS | Status: DC | PRN
  Administered 2022-11-23: 8 ug via INTRAVENOUS
  Administered 2022-11-23 (×2): 12 ug via INTRAVENOUS
  Administered 2022-11-23: 8 ug via INTRAVENOUS

## 2022-11-23 MED ORDER — DIPHENHYDRAMINE HCL 50 MG/ML IJ SOLN: 12.5000 mg | INTRAMUSCULAR | Status: DC | PRN

## 2022-11-23 MED ORDER — FENTANYL CITRATE (PF) 100 MCG/2ML IJ SOLN
INTRAMUSCULAR | Status: AC
  Filled 2022-11-23: qty 2

## 2022-11-23 MED ORDER — FENTANYL CITRATE (PF) 100 MCG/2ML IJ SOLN
INTRAMUSCULAR | Status: DC | PRN
  Administered 2022-11-23: 15 ug via INTRATHECAL

## 2022-11-23 MED ORDER — OXYTOCIN-SODIUM CHLORIDE 30-0.9 UT/500ML-% IV SOLN
INTRAVENOUS | Status: DC | PRN
  Administered 2022-11-23: 30 [IU] via INTRAVENOUS

## 2022-11-23 MED ORDER — PROMETHAZINE HCL 25 MG/ML IJ SOLN: 6.2500 mg | INTRAMUSCULAR | Status: DC | PRN

## 2022-11-23 MED ORDER — SCOPOLAMINE 1 MG/3DAYS TD PT72
1.0000 | MEDICATED_PATCH | Freq: Once | TRANSDERMAL | Status: AC
  Administered 2022-11-23: 1.5 mg via TRANSDERMAL

## 2022-11-23 MED ORDER — PRENATAL MULTIVITAMIN CH
1.0000 | ORAL_TABLET | Freq: Every day | ORAL | Status: DC
  Administered 2022-11-24 – 2022-11-28 (×4): 1 via ORAL
  Filled 2022-11-23 (×4): qty 1

## 2022-11-23 MED ORDER — LIDOCAINE-EPINEPHRINE (PF) 1 %-1:200000 IJ SOLN: 10.0000 mL | Freq: Once | INTRAMUSCULAR | Status: DC

## 2022-11-23 MED ORDER — TRANEXAMIC ACID-NACL 1000-0.7 MG/100ML-% IV SOLN: 1000.0000 mg | INTRAVENOUS | Status: DC

## 2022-11-23 MED ORDER — PROPOFOL 500 MG/50ML IV EMUL
INTRAVENOUS | Status: DC | PRN
  Administered 2022-11-23: 50 mg via INTRAVENOUS
  Administered 2022-11-23: 10 mg via INTRAVENOUS
  Administered 2022-11-23: 50 mg via INTRAVENOUS
  Administered 2022-11-23: 10 mg via INTRAVENOUS

## 2022-11-23 MED ORDER — METHYLENE BLUE 1 % INJ SOLN: 1.0000 mg/kg | Freq: Once | Status: DC

## 2022-11-23 MED ORDER — DEXAMETHASONE SODIUM PHOSPHATE 4 MG/ML IJ SOLN
INTRAMUSCULAR | Status: AC
  Filled 2022-11-23: qty 1

## 2022-11-23 MED ORDER — SODIUM CHLORIDE 0.9 % IV SOLN: INTRAVENOUS | Status: DC

## 2022-11-23 MED ORDER — ORAL CARE MOUTH RINSE: 15.0000 mL | Freq: Once | OROMUCOSAL | Status: AC

## 2022-11-23 MED ORDER — DEXAMETHASONE SODIUM PHOSPHATE 4 MG/ML IJ SOLN
INTRAMUSCULAR | Status: DC | PRN
  Administered 2022-11-23: 4 mg via INTRAVENOUS

## 2022-11-23 MED ORDER — PHENYLEPHRINE HCL-NACL 20-0.9 MG/250ML-% IV SOLN
INTRAVENOUS | Status: AC
  Filled 2022-11-23: qty 250

## 2022-11-23 MED ORDER — KETOROLAC TROMETHAMINE 30 MG/ML IJ SOLN
INTRAMUSCULAR | Status: AC
  Filled 2022-11-23: qty 1

## 2022-11-23 MED ORDER — ACETAMINOPHEN 500 MG PO TABS: 1000.0000 mg | ORAL_TABLET | Freq: Four times a day (QID) | ORAL | Status: DC

## 2022-11-23 MED ORDER — OXYTOCIN-SODIUM CHLORIDE 30-0.9 UT/500ML-% IV SOLN: 2.5000 [IU]/h | INTRAVENOUS | Status: AC

## 2022-11-23 MED ORDER — NALOXONE HCL 4 MG/10ML IJ SOLN: 1.0000 ug/kg/h | INTRAVENOUS | Status: DC | PRN

## 2022-11-23 MED ORDER — SOD CITRATE-CITRIC ACID 500-334 MG/5ML PO SOLN
30.0000 mL | Freq: Once | ORAL | Status: AC
  Administered 2022-11-23: 30 mL via ORAL

## 2022-11-23 MED ORDER — STERILE WATER FOR IRRIGATION IR SOLN
Status: DC | PRN
  Administered 2022-11-23: 1000 mL

## 2022-11-23 MED ORDER — IOHEXOL 300 MG/ML  SOLN
100.0000 mL | Freq: Once | INTRAMUSCULAR | Status: AC | PRN
  Administered 2022-11-23: 20 mL via INTRA_ARTERIAL

## 2022-11-23 MED ORDER — BUPIVACAINE IN DEXTROSE 0.75-8.25 % IT SOLN
INTRATHECAL | Status: DC | PRN
  Administered 2022-11-23: 1.6 mL via INTRATHECAL

## 2022-11-23 MED ORDER — ONDANSETRON HCL 4 MG/2ML IJ SOLN
INTRAMUSCULAR | Status: DC | PRN
  Administered 2022-11-23: 4 mg via INTRAVENOUS

## 2022-11-23 MED ORDER — ACETAMINOPHEN 10 MG/ML IV SOLN
INTRAVENOUS | Status: DC | PRN
  Administered 2022-11-23: 1000 mg via INTRAVENOUS

## 2022-11-23 MED ORDER — MORPHINE SULFATE (PF) 0.5 MG/ML IJ SOLN
INTRAMUSCULAR | Status: DC | PRN
  Administered 2022-11-23: .15 mg via INTRATHECAL

## 2022-11-23 MED ORDER — SOD CITRATE-CITRIC ACID 500-334 MG/5ML PO SOLN
ORAL | Status: AC
  Filled 2022-11-23: qty 30

## 2022-11-23 MED ORDER — FENTANYL CITRATE (PF) 250 MCG/5ML IJ SOLN
INTRAMUSCULAR | Status: AC
  Filled 2022-11-23: qty 5

## 2022-11-23 MED ORDER — SALINE SPRAY 0.65 % NA SOLN
1.0000 | NASAL | Status: DC | PRN
  Filled 2022-11-23: qty 44

## 2022-11-23 MED ORDER — ONDANSETRON HCL 4 MG/2ML IJ SOLN: 4.0000 mg | Freq: Three times a day (TID) | INTRAMUSCULAR | Status: DC | PRN

## 2022-11-23 MED ORDER — LIDOCAINE-EPINEPHRINE 1 %-1:100000 IJ SOLN
INTRAMUSCULAR | Status: AC
  Filled 2022-11-23: qty 1

## 2022-11-23 MED ORDER — DEXMEDETOMIDINE HCL IN NACL 80 MCG/20ML IV SOLN
INTRAVENOUS | Status: AC
  Filled 2022-11-23: qty 20

## 2022-11-23 SURGICAL SUPPLY — 44 items
ADH SKN CLS APL DERMABOND .7 (GAUZE/BANDAGES/DRESSINGS) ×2
APL PRP STRL LF DISP 70% ISPRP (MISCELLANEOUS) ×4
APL SKNCLS STERI-STRIP NONHPOA (GAUZE/BANDAGES/DRESSINGS) ×2
BENZOIN TINCTURE PRP APPL 2/3 (GAUZE/BANDAGES/DRESSINGS) IMPLANT
CHLORAPREP W/TINT 26 (MISCELLANEOUS) ×4 IMPLANT
CLAMP UMBILICAL CORD (MISCELLANEOUS) ×2 IMPLANT
CLOTH BEACON ORANGE TIMEOUT ST (SAFETY) ×2 IMPLANT
DERMABOND ADVANCED .7 DNX12 (GAUZE/BANDAGES/DRESSINGS) ×2 IMPLANT
DRAPE C SECTION CLR SCREEN (DRAPES) ×2 IMPLANT
DRSG OPSITE POSTOP 4X10 (GAUZE/BANDAGES/DRESSINGS) ×2 IMPLANT
ELECT REM PT RETURN 9FT ADLT (ELECTROSURGICAL) ×2
ELECTRODE REM PT RTRN 9FT ADLT (ELECTROSURGICAL) ×2 IMPLANT
EXTRACTOR VACUUM KIWI (MISCELLANEOUS) ×2 IMPLANT
GLOVE BIOGEL PI IND STRL 7.0 (GLOVE) ×4 IMPLANT
GLOVE SURG SS PI 6.5 STRL IVOR (GLOVE) ×2 IMPLANT
GOWN STRL REUS W/TWL LRG LVL3 (GOWN DISPOSABLE) ×4 IMPLANT
KIT ABG SYR 3ML LUER SLIP (SYRINGE) IMPLANT
LIGASURE IMPACT 36 18CM CVD LR (INSTRUMENTS) IMPLANT
MAT PREVALON FULL STRYKER (MISCELLANEOUS) IMPLANT
NDL HYPO 25X5/8 SAFETYGLIDE (NEEDLE) IMPLANT
NDL KEITH (NEEDLE) ×2 IMPLANT
NEEDLE HYPO 25X5/8 SAFETYGLIDE (NEEDLE) IMPLANT
NEEDLE KEITH (NEEDLE) ×2 IMPLANT
NS IRRIG 1000ML POUR BTL (IV SOLUTION) ×2 IMPLANT
PACK C SECTION WH (CUSTOM PROCEDURE TRAY) ×2 IMPLANT
PAD OB MATERNITY 4.3X12.25 (PERSONAL CARE ITEMS) ×2 IMPLANT
RETRACTOR TRAXI PANNICULUS (MISCELLANEOUS) IMPLANT
RTRCTR C-SECT PINK 25CM LRG (MISCELLANEOUS) ×2 IMPLANT
STRIP CLOSURE SKIN 1/2X4 (GAUZE/BANDAGES/DRESSINGS) IMPLANT
SUT CHROMIC 1 CTX 36 (SUTURE) IMPLANT
SUT CHROMIC 2 0 CT 1 (SUTURE) ×2 IMPLANT
SUT PDS AB 0 CTX 60 (SUTURE) IMPLANT
SUT PLAIN 1 NONE 54 (SUTURE) ×2 IMPLANT
SUT PLAIN 2 0 (SUTURE)
SUT PLAIN 2 0 XLH (SUTURE) IMPLANT
SUT PLAIN ABS 2-0 CT1 27XMFL (SUTURE) IMPLANT
SUT VIC AB 0 CTX 36 (SUTURE) ×2
SUT VIC AB 0 CTX36XBRD ANBCTRL (SUTURE) ×2 IMPLANT
SUT VIC AB 1 CTX 36 (SUTURE) ×4
SUT VIC AB 1 CTX36XBRD ANBCTRL (SUTURE) ×4 IMPLANT
TOWEL OR 17X24 6PK STRL BLUE (TOWEL DISPOSABLE) ×2 IMPLANT
TRAY FOLEY W/BAG SLVR 14FR LF (SET/KITS/TRAYS/PACK) ×2 IMPLANT
WATER STERILE IRR 1000ML POUR (IV SOLUTION) ×2 IMPLANT
YANKAUER SUCT BULB TIP NO VENT (SUCTIONS) IMPLANT

## 2022-11-23 NOTE — Procedures (Signed)
Interventional Radiology Procedure Note  Procedure:  Selective catheterization of the bilateral internal iliac arteries  Findings: Please refer to procedural dictation for full description. 6 Fr bilateral common femoral artery sheath placement.  Bilateral 6 Fr Angioseal closure.  Complications: None  Estimated Blood Loss: < 5 mL  Recommendations: Strict 2 hour bedrest (head of bed up to 30 degrees, 17:00-19:00), then ambulate as tolerated.   Marliss Coots, MD

## 2022-11-23 NOTE — H&P (Signed)
Chief Complaint: Patient was seen in consultation today for placenta accreta  at the request of Suttle,Dylan J  Referring Physician(s): Bennie Dallas  Supervising Physician: Jaymes Graff, MD   Patient Status: [facility] - Out-pt  History of Present Illness: Nicole Landry is a 37 y.o. female with PMHs of [id number] female who is scheduled for C section today presents for placement of internal iliac occlusion balloons at the time of planned c-section for hemorrhagic control.   Patiet was found to have imaging findings concerning for placenta lacunae and possible placenta accreta spectrum during this pregnancy and with history of multiparity, prior c-section x4, her OBGYN/MFM team has recommended placement of occlusion balloons prior to c-section for hemorrhagic control. Patient was referred to Dr. Elby Showers and has consultation visit on 10/30/22, she was deemed good candidate for placement of internal iliac occlusion balloons at the time of planned c-section for hemorrhagic control.   Patient seen in LDPO.  Laying in bed, RN, anesthesia and husband are at bedside.  Denise headache, fever, chills, shortness of breath, cough, chest pain, abdominal pain, nausea ,vomiting, and bleeding.   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/2021   Chest pain 02/26/2021   Ectopic pregnancy, tubal    Encounter for postpartum visit 03/06/2021   Gestational diabetes    Gestational diabetes mellitus 2011   denies gestational DM with current pregnancy   Iron deficiency anemia 07/08/2022   Neuralgia 03/06/2021   Obstetric pulmonary embolism, postpartum 02/26/2021   Ovarian cyst    Postpartum hypertension 02/26/2021   Shortness of breath 02/26/2021   Status post vacuum-assisted vaginal delivery (5/17) 11/20/2015   Visit for wound check 03/06/2021    Past Surgical  History:  Procedure Laterality Date   BILATERAL SALPINGECTOMY Right 02/08/2013   Procedure:  SALPINGECTOMY;  Surgeon: Antionette Char, MD;  Location: [facility] ORS;  Service: Gynecology;  Laterality: Right;   CESAREAN SECTION     CESAREAN SECTION N/A 08/18/2017   Procedure: Repeat CESAREAN SECTION;  Surgeon: Shea Evans, MD;  Location: [facility] BIRTHING SU[facility]ES;  Service: Obstetrics;  Laterality: N/A;  EDD: 08/24/17 Allergy: Vicodin   CESAREAN SECTION N/A 04/12/2019   Procedure: Repeat CESAREAN SECTION;  Surgeon: Shea Evans, MD;  Location: [facility] LD ORS;  Service: Obstetrics;  Laterality: N/A;  EDD: 04/17/19 Allergy: Vicodin   CESAREAN SECTION N/A 02/21/2021   Procedure: CESAREAN SECTION;  Surgeon: Myna Hidalgo, DO;  Location: [facility] ORS;  Service: Obstetrics;  Laterality: N/A;   DILATION AND CURETTAGE OF UTERUS     IR RADIOLOGIST EVAL & MGMT  10/30/2022   LAPAROSCOPY N/A 02/08/2013   Procedure: LAPAROSCOPY OPERATIVE;  Surgeon: Antionette Char, MD;  Location: [facility];  Service: Gynecology;  Laterality: N/A;   SALPINGECTOMY Right 02/2013   op note in epic    Allergies: Food, Povidone iodine, and Vicodin [hydrocodone-acetaminophen]  Medications: Prior to Admission medications   Medication Sig Start Date End Date Taking? Authorizing Provider  calcium carbonate (TUMS - DOSED IN MG ELEMENTAL CALCIUM) 500 MG chewable tablet Chew 1-2 tablets by mouth 3 (three) times daily as needed for indigestion or heartburn.    [provider]  Cyanocobalamin (B-12 COMPLIANCE INJECTION) 1000 MCG/ML KIT Inject 1,000 mcg as directed every 30 (thirty) days.    [provider]  enoxaparin (LOVENOX) 60 MG/0.6ML injection Inject 60 mg into the skin every 12 (twelve)  hours.    [provider]  Prenatal Vit-Fe Fumarate-FA (PRENATAL MULTIVITAMIN) TABS tablet Take 1 tablet by mouth in the morning.    [provider]     Family History  Problem Relation Age of Onset   Cancer Maternal  Grandmother        unknown cancer in her 59s   Asthma Neg Hx    Depression Neg Hx    Diabetes Neg Hx    Heart disease Neg Hx    Hypertension Neg Hx    Obesity Neg Hx    Miscarriages / Stillbirths Neg Hx     Social History   Socioeconomic History   Marital status: Married    Spouse name: Not on file   Number of children: Not on file   Years of education: Not on file   Highest education level: Not on file  Occupational History   Not on file  Tobacco Use   Smoking status: Never   Smokeless tobacco: Never  Vaping Use   Vaping Use: Never used  Substance and Sexual Activity   Alcohol use: No   Drug use: No   Sexual activity: Yes    Birth control/protection: None  Other Topics Concern   Not on file  Social History Narrative   ** Merged History Encounter **       Social Determinants of Health   Financial Resource Strain: Low Risk  (03/28/2019)   Overall Financial Resource Strain (CARDIA)    Difficulty of Paying Living Expenses: Not hard at all  Food Insecurity: No Food Insecurity (03/06/2021)   Hunger Vital Sign    Worried About Running Out of Food in the Last Year: Never true    Ran Out of Food in the Last Year: Never true  Transportation Needs: No Transportation Needs (03/06/2021)   PRAPARE - Administrator, Civil Service (Medical): No    Lack of Transportation (Non-Medical): No  Physical Activity: Not on file  Stress: Not on file  Social Connections: Not on file     Review of Systems: A 12 point ROS discussed and pertinent positives are indicated in the HPI above.  All other systems are negative.  Vital Signs: LMP 03/15/2022    Physical Exam Vitals reviewed.  Constitutional:      General: She is not in acute distress.    Appearance: She is not ill-appearing.  HENT:     Head: Normocephalic.     Mouth/Throat:     Mouth: Mucous membranes are moist.     Pharynx: Oropharynx is clear.  Cardiovascular:     Rate and Rhythm: Normal rate and regular  rhythm.     Heart sounds: Normal heart sounds.  Pulmonary:     Effort: Pulmonary effort is normal.     Breath sounds: Normal breath sounds.  Abdominal:     General: Abdomen is flat. Bowel sounds are normal.     Palpations: Abdomen is soft.  Musculoskeletal:     Cervical back: Neck supple.  Skin:    General: Skin is warm and dry.     Coloration: Skin is not jaundiced or pale.  Neurological:     Mental Status: She is alert and oriented to person, place, and time.  Psychiatric:        Mood and Affect: Mood normal.        Behavior: Behavior normal.        Judgment: Judgment normal.    Sedation per anesthesia team  Imaging: [place] MFM FETAL BPP WO NON STRESS  Result Date: 11/11/2022 ----------------------------------------------------------------------  OBSTETRICS REPORT                       (Signed Final 11/11/2022 04:05 pm) ---------------------------------------------------------------------- Patient Info  ID #:       [id number]                          D.O.B.:  10/19/1985 (36 yrs)  Name:       Nicole Landry              Visit Date: 11/11/2022 02:43 pm ---------------------------------------------------------------------- Performed By  Attending:        Ma Rings MD         Ref. Address:     [facility]                                                             Gynecology                                                             [address]                                                             [address]                                                             [place], [place]                                                             [address]  Performed By:     Earley Brooke     Location[facility]                    BS, RDMS                                 [facility]  Referred By:      Nigel Bridgeman                    CNM ---------------------------------------------------------------------- Orders  #  Description                           Code        Ordered By  1  [place] MFM FETAL BPP WO NON               76819.01    [facility]     STRESS ----------------------------------------------------------------------  #  Order #                     Accession #                Episode #  1  [id number]                   [id number]                 [id number] ---------------------------------------------------------------------- Indications  Hypertension - Chronic/Pre-existing (no        O10.019  meds)  Obesity complicating pregnancy, third          O99.213  trimester (pregravid BMI 39)  Advanced maternal age multigravida 88+,        O64.523  third trimester (36 yrs)  [redacted] weeks gestation of pregnancy                Z3A.34  Placental abnormality (numerous lakes)         O43.103  complicating pregnancy, third trimester  Previous cesarean delivery, antepartum x 4     O34.219  Medical complication of pregnancy (history     O26.90  of PE)(Lovenox)  Grand multiparity, antepartum                  O61.40  Encounter for other antenatal screening        Z36.2  follow-up ---------------------------------------------------------------------- Fetal Evaluation  Num Of Fetuses:         1  Fetal Heart Rate(bpm):  140  Cardiac Activity:       Observed  Presentation:           Cephalic  Placenta:               Anterior  P. Cord  Insertion:      Previously visualized  Amniotic Fluid  AFI FV:      Within normal limits  AFI Sum(cm)     %Tile       Largest Pocket(cm)  17.86           66          6.3  RUQ(cm)       RLQ(cm)       LUQ(cm)        LLQ(cm)  4.4           4.7           6.3            2.46 ---------------------------------------------------------------------- Biophysical Evaluation  Amniotic F.V:   Within normal  limits       F. Tone:        Observed  F. Movement:    Observed                   Score:          8/8  F. Breathing:   Observed ---------------------------------------------------------------------- OB History  Gravidity:    8         Term:   5        Prem:   0        SAB:   1  TOP:          0       Ectopic:  1        Living: 5 ---------------------------------------------------------------------- Gestational Age  LMP:           34w 3d        Date:  03/15/22                  EDD:   12/20/22  Best:          34w 3d     Det. By:  LMP  (03/15/22)          EDD:   12/20/22 ---------------------------------------------------------------------- Comments  This patient was seen for a BPP due to maternal obesity and  gestational diabetes.  Her pregnancy has also been  complicated by possible placenta accreta.  She has a history  of 4 prior cesarean deliveries.  She is also being treated with  Lovenox due to her history of a prior pulmonary embolus.  She denies any problems since her last exam.  A biophysical profile performed today was 8/8.  The AFI was 17.86 cm (within normal limits).  Multiple lacunae continue to be noted throughout the anterior  placenta.  There were no signs of placenta previa noted  today.  The patient was advised that based on her history of 4  prior cesarean deliveries and today's ultrasound exam, my  suspicion is fairly high that she does have a placenta accreta.  The patient is scheduled for an MRI this weekend to help  decipher if a placenta accreta is present.  The patient states  that she is hesitant to have the MRI exam performed as she  is claustrophobic.  I am uncertain if the MRI performed by  [facility] would add any additional information.  There is already a multidisciplinary meeting scheduled next  week with the team involved with her delivery to discuss her  management.  She will return in 1 week for another BPP. ----------------------------------------------------------------------                   Ma Rings,  MD Electronically Signed Final Report   11/11/2022 04:05 pm ----------------------------------------------------------------------  [place] MFM OB FOLLOW UP  Result Date: 11/04/2022 ----------------------------------------------------------------------  OBSTETRICS REPORT                    (Corrected Final 11/04/2022 03:06 pm) ---------------------------------------------------------------------- Patient Info  ID #:       [id number]                          D.O.B.:  11/03/1985 (36 yrs)  Name:       Nicole Landry              Visit Date: 10/29/2022 07:55 am ---------------------------------------------------------------------- Performed By  Attending:        Braxton Feathers DO       Ref. Address:     [facility] &                                                             Gynecology                                                             [address]                                                             [address]                                                             [place], [place]                                                             [address]  Performed By:     Marcellina Millin       Location:  [facility]                    RDMS                                     [facility]  Referred By:      Nigel Bridgeman                    CNM ---------------------------------------------------------------------- Orders  #  Description                           Code        Ordered By  1  [place] MFM OB FOLLOW UP                   19147.82    Lin Landsman ----------------------------------------------------------------------  #  Order #                      Accession #                Episode #  1  [id number]                   [id number]                 [id number] ---------------------------------------------------------------------- Indications  Hypertension - Chronic/Pre-existing (no        O10.019  meds)  Obesity complicating pregnancy, third          O99.213  trimester (pregravid BMI 39)  Advanced maternal age multigravida 10+,        O55.523  third trimester (36 yrs)  Placental abnormality (numerous lakes)         O43.103  complicating pregnancy, third trimester  Previous cesarean delivery, antepartum x 4     O34.219  Medical complication of pregnancy (history     O26.90  of PE)(Lovenox)  Grand multiparity, antepartum                  O46.40  Encounter for other antenatal screening        Z36.2  follow-up  [redacted] weeks gestation of pregnancy                Z3A.32 ---------------------------------------------------------------------- Vital Signs  BP:  122/71 ---------------------------------------------------------------------- Fetal Evaluation  Num Of Fetuses:         1  Fetal Heart Rate(bpm):  137  Cardiac Activity:       Observed  Presentation:           Cephalic  Placenta:               Anterior  P. Cord Insertion:      Previously visualized  Amniotic Fluid  AFI FV:      Within normal limits  AFI Sum(cm)     %Tile       Largest Pocket(cm)  18.91           70          7.36  RUQ(cm)       RLQ(cm)       LUQ(cm)        LLQ(cm)  7.36          2.77          6.22           2.56 ---------------------------------------------------------------------- Biometry  BPD:      83.1  mm     G. Age:  33w 3d         68  %    CI:        75.05   %    70 - 86                                                          FL/HC:      19.6   %    19.9 - 21.5  HC:      304.3  mm     G. Age:  33w 6d         46  %    HC/AC:      0.95        0.96 - 1.11  AC:      319.3  mm     G. Age:  35w 6d       > 99  %    FL/BPD:     71.8   %    71 - 87  FL:       59.7  mm     G. Age:  31w  1d          8  %    FL/AC:      18.7   %    20 - 24  Est. FW:    2361  gm      5 lb 3 oz     86  % ---------------------------------------------------------------------- OB History  Gravidity:    8         Term:   5        Prem:   0        SAB:   1  TOP:          0       Ectopic:  1        Living: 5 ---------------------------------------------------------------------- Gestational Age  LMP:           32w 4d        Date:  03/15/22  EDD:   12/20/22  U/S Today:     33w 4d                                        EDD:   12/13/22  Best:          32w 4d     Det. By:  LMP  (03/15/22)          EDD:   12/20/22 ---------------------------------------------------------------------- Anatomy  Cranium:               Appears normal         LVOT:                   Previously seen  Cavum:                 Previously seen        Aortic Arch:            Previously seen  Ventricles:            Previously seen        Ductal Arch:            Previously seen  Choroid Plexus:        Previously seen        Diaphragm:              Appears normal  Cerebellum:            Previously seen        Stomach:                Appears normal, left                                                                        sided  Posterior Fossa:       Previously seen        Abdomen:                Previously seen  Nuchal Fold:           Previously seen        Abdominal Wall:         Previously seen  Face:                  Orbits and profile     Cord Vessels:           Previously seen                         previously seen  Lips:                  Previously seen        Kidneys:                Appear normal  Palate:                Not well visualized    Bladder:                Appears normal  Thoracic:  Previously seen        Spine:                  Previously seen  Heart:                 Previously seen        Upper Extremities:      Previously seen  RVOT:                  Previously seen        Lower Extremities:      Previously seen   Other:  Female gender, Nasal bone, 3VV, 3VTV, Lenses,  Hands, and feet,          Mandible, maxilla and VC previously visualized ---------------------------------------------------------------------- Comments  The patient is here for a follow-up ultrasound at 32w 4d for  prior CD x4, AMA gDMA1, CHTN. EDD: 12/20/2022 dated by  LMP  (03/15/22). She has no concerns today and reports  good blood sugar control  Sonographic findings  Single intrauterine pregnancy.  Fetal cardiac activity:  Observed and appears normal.  Presentation: Cephalic.  Interval fetal anatomy appears normal.  Fetal biometry shows the estimated fetal weight at the 86  percentile.  Amniotic fluid volume: Within normal limits. MVP: 7.36 cm.  Placenta: Anterior. Multiple placental lakes have been noted  but the retroplacental spaces appears normal. Low concern  for PAS at this time.  Recommendations  1. Serial growth ultrasounds every 4 weeks until delivery  2. Antenatal testing to start around 34 weeks due to multiple  medical comorbidities  3. Due to the low but possible risk of a focal placenta accreta  the patient will be scheduled for a cesarean delivery at 36  weeks scheduled. We will continue to discuss the surgical  approach.  4. Hold Lovenox 24 hours prior to delivery ----------------------------------------------------------------------                      Braxton Feathers, DO Electronically Signed Corrected Final Report  11/04/2022 03:06 pm ----------------------------------------------------------------------  IR Radiologist Eval & Mgmt  Result Date: 10/30/2022 EXAM: NEW PATIENT OFFICE VISIT CHIEF COMPLAINT: See Epic note. HISTORY OF PRESENT ILLNESS: See Epic note. REVIEW OF SYSTEMS: See Epic note. PHYSICAL EXAMINATION: See Epic note. ASSESSMENT AND PLAN: See Epic note. Marliss Coots, MD [facility] Electronically Signed   By: Marliss Coots M.D.   On: 10/30/2022 13:26   ECHOCARDIOGRAM  COMPLETE  Result Date: 10/30/2022    ECHOCARDIOGRAM REPORT   Patient Name:   Nicole Landry Date of Exam: 10/29/2022 Medical Rec #:  [id number]          Height:       64.0 in Accession #:    [id number]         Weight:       248.4 lb Date of Birth:  06/09/1986          BSA:          2.145 m Patient Age:    36 years           BP:           132/84 mmHg Patient Gender: F                  HR:           99 bpm. Exam Location:  [address] Procedure: 2D Echo, 3D Echo, Cardiac Doppler and Color Doppler  Indications:    R01.1 Murmur  History:        Patient has no prior history of Echocardiogram examinations.                 Signs/Symptoms:Dyspnea and Dizziness/Lightheadedness; Risk                 Factors:Hypertension. Cardio-OB Echo, Pregnancy- 32 weeks,                 History of Post Partum Hypertension and Pulmonary Embolism with                 prior Pregnancy.  Sonographer:    Farrel Conners RDCS Referring Phys: Thomasene Ripple  Sonographer Comments: Cardio-OB Echo IMPRESSIONS  1. Left ventricular ejection fraction, by estimation, is 70 to 75%. The left ventricle has hyperdynamic function. The left ventricle has no regional wall motion abnormalities. There is mild concentric left ventricular hypertrophy. Left ventricular diastolic parameters were normal.  2. Right ventricular systolic function is normal. The right ventricular size is normal.  3. The mitral valve is normal in structure. Trivial mitral valve regurgitation. No evidence of mitral stenosis.  4. The aortic valve is tricuspid. Aortic valve regurgitation is trivial. No aortic stenosis is present.  5. The inferior vena cava is normal in size with greater than 50% respiratory variability, suggesting right atrial pressure of 3 mmHg. FINDINGS  Left Ventricle: Left ventricular ejection fraction, by estimation, is 70 to 75%. The left ventricle has hyperdynamic function. The left ventricle has no regional wall motion abnormalities. The left ventricular internal  cavity size was normal in size. There is mild concentric left ventricular hypertrophy. Left ventricular diastolic parameters were normal. Right Ventricle: The right ventricular size is normal. No increase in right ventricular wall thickness. Right ventricular systolic function is normal. Left Atrium: Left atrial size was normal in size. Right Atrium: Right atrial size was normal in size. Pericardium: There is no evidence of pericardial effusion. Mitral Valve: The mitral valve is normal in structure. Trivial mitral valve regurgitation. No evidence of mitral valve stenosis. Tricuspid Valve: The tricuspid valve is normal in structure. Tricuspid valve regurgitation is trivial. No evidence of tricuspid stenosis. Aortic Valve: The aortic valve is tricuspid. Aortic valve regurgitation is trivial. Aortic regurgitation PHT measures 447 msec. No aortic stenosis is present. Pulmonic Valve: The pulmonic valve was normal in structure. Pulmonic valve regurgitation is not visualized. No evidence of pulmonic stenosis. Aorta: The aortic root is normal in size and structure. Venous: The inferior vena cava is normal in size with greater than 50% respiratory variability, suggesting right atrial pressure of 3 mmHg. IAS/Shunts: No atrial level shunt detected by color flow Doppler.  LEFT VENTRICLE PLAX 2D LVIDd:         4.80 cm   Diastology LVIDs:         2.80 cm   LV e' medial:    8.95 cm/s LV PW:         1.00 cm   LV E/e' medial:  9.1 LV IVS:        1.10 cm   LV e' lateral:   15.30 cm/s LVOT diam:     2.50 cm   LV E/e' lateral: 5.3 LV SV:         125 LV SV Index:   58 LVOT Area:     4.91 cm  3D Volume EF:                          3D EF:        72 %                          LV EDV:       194 ml                          LV ESV:       54 ml                          LV SV:        139 ml RIGHT VENTRICLE RV Basal diam:  4.00 cm RV Mid diam:    3.40 cm RV S prime:     17.50 cm/s TAPSE (M-mode): 2.4 cm RVSP:            26.2 mmHg LEFT ATRIUM             Index        RIGHT ATRIUM           Index LA diam:        3.80 cm 1.77 cm/m   RA Pressure: 3.00 mmHg LA Vol (A2C):   73.5 ml 34.26 ml/m  RA Area:     19.00 cm LA Vol (A4C):   60.3 ml 28.11 ml/m  RA Volume:   54.40 ml  25.36 ml/m LA Biplane Vol: 67.4 ml 31.42 ml/m  AORTIC VALVE LVOT Vmax:   161.50 cm/s LVOT Vmean:  101.000 cm/s LVOT VTI:    0.256 m AI PHT:      447 msec  AORTA Ao Root diam: 3.50 cm Ao Asc diam:  3.50 cm MITRAL VALVE               TRICUSPID VALVE MV Area (PHT)  cm         TR Peak grad:   23.2 mmHg MV Decel Time: 226 msec    TR Vmax:        241.00 cm/s MV E velocity: 81.65 cm/s  Estimated RAP:  3.00 mmHg MV A velocity: 71.00 cm/s  RVSP:           26.2 mmHg MV E/A ratio:  1.15                            SHUNTS                            Systemic VTI:  0.26 m                            Systemic Diam: 2.50 cm Arvilla Meres MD Electronically signed by Arvilla Meres MD Signature Date/Time: 10/30/2022/11:48:49 AM    Final     Labs:  CBC: Recent Labs    07/07/22 1551 08/20/22 1229 11/20/22 1005  WBC 9.0 6.7 6.0  HGB 9.1* 11.0* 11.8*  HCT 30.7* 34.2*  35.3 37.4  PLT 431* 298 245    COAGS: No results for input(s): "INR", "APTT" in the last 8760 hours.  BMP: Recent Labs    07/07/22 1551 08/20/22 1229  NA 137 138  K 3.6 3.8  CL 103 104  CO2 25 26  GLUCOSE 74 97  BUN 6 5*  CALCIUM 10.0 9.0  CREATININE 0.45 0.39*  GFRNONAA >60 >60    LIVER FUNCTION TESTS: Recent Labs    07/07/22 1551 08/20/22 1229  BILITOT 0.3 0.2*  AST 10* 8*  ALT 6 6  ALKPHOS 44 43  PROT 7.7 6.8  ALBUMIN 3.9 3.8    TUMOR MARKERS: No results for input(s): "AFPTM", "CEA", "CA199", "CHROMGRNA" in the last 8760 hours.  Assessment and Plan: 37 y.o. 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   NPO since MN VSS No abs  Allergies reviewed   Risks and benefits of procedure were discussed with the patient including, but not limited to bleeding, infection, vascular injury radiation exposure to the fetus or contrast induced renal failure.   All of the patient's questions were answered, patient is agreeable to proceed.   Consent signed and in chart.    Thank you for this interesting consult.  I greatly enjoyed meeting Nicole Landry and look forward to participating in their care.  A copy of this report was sent to the requesting provider on this date.  Electronically Signed: Willette Brace, PA-C 11/23/2022, 10:11 AM   I spent a total of  30 Minutes   in face to face in clinical consultation, greater than 50% of which was counseling/coordinating care for placement of internal iliac occlusion balloons at the time of planned c-section for hemorrhagic control.   This chart was dictated using voice recognition software.  Despite best efforts to proofread,  errors can occur which can change the documentation meaning.

## 2022-11-23 NOTE — Anesthesia Procedure Notes (Signed)
Epidural Patient location during procedure: OB  Staffing Anesthesiologist: Collene Schlichter, MD Performed: anesthesiologist   Preanesthetic Checklist Completed: patient identified, IV checked, risks and benefits discussed, monitors and equipment checked, pre-op evaluation and timeout performed  Epidural Patient position: sitting Prep: DuraPrep Patient monitoring: blood pressure and continuous pulse ox Approach: midline Location: L3-L4 Injection technique: LOR air  Needle:  Needle type: Tuohy  Needle gauge: 17 G Needle length: 9 cm Needle insertion depth: 7 cm Catheter size: 19 Gauge Catheter at skin depth: 12 cm Test dose: negative and Other (1% Lidocaine)  Assessment Sensory level: T6  Additional Notes COMBINED SPINAL/EPIDURAL. LOR at 7cm at skin with 17ga Tuohy needle. Spinal needle passed through Touhy, stylet withdrawn and CSF confirmed with aspiration prior to injection of spinal dose of bupivacaine. 19 gauge epidural catheter threaded through Touhy, Touhy needle withdrawn, and epidural catheter taped at 12cm at skin. Patient returned supine. VSS.Reason for block:procedure for pain

## 2022-11-23 NOTE — Interval H&P Note (Signed)
History and Physical Interval Note:  11/23/2022 2:34 PM  Nicole Landry  has presented today for surgery, with the diagnosis of history of cesarean section Possible Hysterectomy cell saver.  The various methods of treatment have been discussed with the patient and family. After consideration of risks, benefits and other options for treatment, the patient has consented to  Procedure(s) with comments: CESAREAN SECTION WITH BILATERAL TUBAL LIGATION (Bilateral) APPLICATION OF CELL SAVER (N/A) - 4th case approved by Misty Stanley for 3pm due to need CS done on the 20th and  2 MDS required for this case.  C/S TIMES FIVE WITH B SALPINGECTOMY MAY HAVE ACCRETA WITH ANTERIOR PLACENTA SCHEDULE AT 36-37 WEEKS THREE HOURS WITH TWO MDS POSSIBLE C HYST CELL SAVER PT WILL NEED BMZ 12 MG Q 24 TIMES TWO DOSES AT 36 WEEKS BEFORE  TYPE AND CROSS FOR FOUR UNITS PRBCS HOLD LOVENOX 24 HOURS BEFORE CS PT NEEDS AN ANESTHESIA CONSULT AS WELL also will need interventional radiology for possible emboilizaiton HYSTERECTOMY ABDOMINAL (N/A) as a surgical intervention.  The patient's history has been reviewed, patient examined, no change in status, stable for surgery.  I have reviewed the patient's chart and labs.  Questions were answered to the patient's satisfaction.     Prescilla Sours, MD.

## 2022-11-23 NOTE — Lactation Note (Signed)
This note was copied from a baby's chart. Lactation Consultation Note  Patient Name: Nicole Landry Date: 11/23/2022 Age:37 hours Reason for consult: Late-preterm 34-36.6wks RN asked mom if she would like to see Lactation and she has declined several times and doesn't need Lactation service. LC suggested RN to talk w/mom because baby is 36 wks. That she should pump and supplement w/her BM that she pumps. And reviewed LPI behavior and feeding habits. RN stated she would discussed this w/mom.  Maternal Data    Feeding Nipple Type: Slow - flow  LATCH Score                    Lactation Tools Discussed/Used    Interventions    Discharge    Consult Status Consult Status: Complete    Arletha Marschke G 11/23/2022, 10:00 PM

## 2022-11-23 NOTE — Anesthesia Preprocedure Evaluation (Addendum)
Anesthesia Evaluation  Patient identified by MRN, date of birth, ID band Patient awake    Reviewed: Allergy & Precautions, NPO status , Patient's Chart, lab work & pertinent test results  Airway Mallampati: II  TM Distance: >3 FB Neck ROM: Full    Dental  (+) Teeth Intact, Dental Advisory Given   Pulmonary PE   Pulmonary exam normal breath sounds clear to auscultation       Cardiovascular hypertension, Normal cardiovascular exam Rhythm:Regular Rate:Normal     Neuro/Psych negative neurological ROS     GI/Hepatic negative GI ROS, Neg liver ROS,,,  Endo/Other  diabetes, Gestational  Morbid obesity  Renal/GU negative Renal ROS     Musculoskeletal negative musculoskeletal ROS (+)    Abdominal   Peds  Hematology  (+) Blood dyscrasia (Lovenox- last dose 2 weeks ago), anemia Plt 245k   Anesthesia Other Findings Day of surgery medications reviewed with the patient.  Reproductive/Obstetrics (+) Pregnancy H/o C-section x4 MAY HAVE ACCRETA WITH ANTERIOR PLACENTA                             Anesthesia Physical Anesthesia Plan  ASA: 3  Anesthesia Plan: Spinal   Post-op Pain Management:    Induction:   PONV Risk Score and Plan: 2 and Scopolamine patch - Pre-op, Dexamethasone and Ondansetron  Airway Management Planned: Natural Airway  Additional Equipment: Arterial line and CVP  Intra-op Plan:   Post-operative Plan:   Informed Consent: I have reviewed the patients History and Physical, chart, labs and discussed the procedure including the risks, benefits and alternatives for the proposed anesthesia with the patient or authorized representative who has indicated his/her understanding and acceptance.     Dental advisory given  Plan Discussed with: CRNA, Anesthesiologist and Surgeon  Anesthesia Plan Comments: (Discussed risks and benefits of and differences between spinal and general.  Discussed risks of spinal including headache, backache, failure, bleeding, infection, and nerve damage. Patient consents to spinal. Questions answered. Coagulation studies and platelet count acceptable.  **Possible placenta accreta. Extensive discussion with patient and spouse regarding bleeding risk, need for adequate intravenous access, invasive monitoring, potential for conversion to general anesthesia.  All questions answered.)       Anesthesia Quick Evaluation

## 2022-11-23 NOTE — H&P (Addendum)
Nicole Landry is a 37 y.o. female [phone] at 59 weeks 1 day EGA presenting for a repeat cesarean section, removal of left (and possible right if still present), possible cesarean hysterectomy,for history of four prior cesarean sections, desires permanent sterilization and with possible accreta on ultrasound.  Patient also desires placement if internal iliac balloons incase of excessive bleeding.  She was consented for a cell saver and blood products transfusion.  She is s/p Betamethasone injection for fetal lung maturity over the weekend.   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 Recent ultrasound: 10/29/22: EFW 5lbs 3 oz (86th%), AC > 99%, Placenta is not hypervascular but with prominent lacunae.  Uterine placental interface is distinct. Low concern for Accreta Placenta Syndrome.  Iron deficiency anemia and s/p IV iron transfusion. Elevated 1 hr GTT, declines doing 3 hr GTT, FSG checks at home have been normal per patient.  Chronic HTN no medication use.  History of Pulmonary embolus after last pregnancy and was using lovenox in pregnancy, last lovenox use was two weeks ago per patient.   OB History     Gravida  8   Para  5   Term  5   Preterm  0   AB  2   Living  5      SAB  1   IAB  0   Ectopic  1   Multiple  0   Live Births  5        Obstetric Comments  Pt states does not want a C/S for this Pregnancy.        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/2021   Chest pain 02/26/2021   Ectopic pregnancy, tubal    Encounter for postpartum visit 03/06/2021   Gestational diabetes    Gestational diabetes mellitus 2011   denies gestational DM with current pregnancy   Iron deficiency anemia 07/08/2022   Neuralgia 03/06/2021   Obstetric pulmonary embolism, postpartum 02/26/2021    Ovarian cyst    Postpartum hypertension 02/26/2021   Shortness of breath 02/26/2021   Status post vacuum-assisted vaginal delivery (5/17) 11/20/2015   Visit for wound check 03/06/2021   Past Surgical History:  Procedure Laterality Date   BILATERAL SALPINGECTOMY Right 02/08/2013   Procedure:  SALPINGECTOMY;  Surgeon: Antionette Char, MD;  Location: [facility];  Service: Gynecology;  Laterality: Right;   CESAREAN SECTION     CESAREAN SECTION N/A 08/18/2017   Procedure: Repeat CESAREAN SECTION;  Surgeon: Shea Evans, MD;  Location: [facility];  Service: Obstetrics;  Laterality: N/A;  EDD: 08/24/17 Allergy: Vicodin   CESAREAN SECTION N/A 04/12/2019   Procedure: Repeat CESAREAN SECTION;  Surgeon: Shea Evans, MD;  Location: [facility];  Service: Obstetrics;  Laterality: N/A;  EDD: 04/17/19 Allergy: Vicodin   CESAREAN SECTION N/A 02/21/2021   Procedure: CESAREAN SECTION;  Surgeon: Myna Hidalgo, DO;  Location: [facility];  Service: Obstetrics;  Laterality: N/A;   DILATION AND CURETTAGE OF UTERUS     IR RADIOLOGIST EVAL & MGMT  10/30/2022   LAPAROSCOPY N/A 02/08/2013   Procedure: LAPAROSCOPY OPERATIVE;  Surgeon: Antionette Char, MD;  Location: [facility];  Service: Gynecology;  Laterality: N/A;   SALPINGECTOMY Right 02/2013   op note in epic   Family History: family history includes Cancer in her maternal grandmother. Social History:  reports that she has never smoked. She has never used smokeless tobacco. She reports that she does not drink alcohol and does not use drugs.     Maternal Diabetes: No With elevated 1 hour glucose test, declines 3 hr GTT.  Genetic Screening: Normal Maternal Ultrasounds/Referrals: As above. Other: Fetal Ultrasounds or other Referrals:  Other:  Maternal Substance Abuse:  No Significant Maternal Medications:  Meds include: Other:  Significant Maternal Lab Results:  Group B Strep positive Number of Prenatal Visits:greater than 3 verified prenatal  visits Other Comments:  None  Review of Systems  Constitutional: Denies fevers/chills Cardiovascular: Denies chest pain or palpitations Pulmonary: Denies coughing or wheezing Gastrointestinal: Denies nausea, vomiting or diarrhea Genitourinary: Denies pelvic pain, unusual vaginal bleeding, unusual vaginal discharge, dysuria, urgency or frequency.  Musculoskeletal: Denies muscle or joint aches and pain.  Neurology: Denies abnormal sensations such as tingling or numbness.   Blood pressure 136/87, pulse 92, temperature 98.3 F (36.8 C), temperature source Oral, height 5\' 3"  (1.6 m), weight 112.9 kg, last menstrual period 03/15/2022, SpO2 95 %, currently breastfeeding. Exam Physical Exam  Constitutional: She is oriented to person, place, and time. She appears well-developed and well-nourished.  HENT:  Head: Normocephalic and atraumatic.  Neck: Normal range of motion.  Cardiovascular: Normal rate.    Respiratory: Effort normal.   GI: Soft.  Skin: Skin is warm and dry.  Psychiatric: She has a normal mood and affect. Her behavior is normal.   Genitourinary: Gravid uterus, appropriate for gestational age.    Current Outpatient Medications  Medication Instructions   B-12 Compliance Injection 1,000 mcg, Injection, Every 30 days   calcium carbonate (TUMS - DOSED IN MG ELEMENTAL CALCIUM) 500 MG chewable tablet 1-2 tablets, Oral, 3 times daily PRN   enoxaparin (LOVENOX) 60 mg, Subcutaneous, Every 12 hours   Prenatal Vit-Fe Fumarate-FA (PRENATAL MULTIVITAMIN) TABS tablet 1 tablet, Oral, Every morning    Allergies  Allergen Reactions   Food Anaphylaxis and Other (See Comments)    Pt is allergic to Cantalope   Povidone Iodine     Preoperative nasal antiseptic   Vicodin [Hydrocodone-Acetaminophen] Other (See Comments)    Reaction:  Seizures; has tolerated acetaminophen and other narcotics in the past     Prenatal labs: ABO, Rh: --/--/O POS (05/17 1009) Antibody: NEG (05/17 1009) Rubella:  Immune (12/14 0000) RPR: NON REACTIVE (05/17 1005)  HBsAg: Negative (12/14 0000)  HIV: Non-reactive (12/14 0000)  GBS: Positive/-- (12/14 0000)    Recent Results (from the past 2160 hour(s))  ECHOCARDIOGRAM COMPLETE     Status: None   Collection Time: 10/29/22  2:36 PM  Result Value Ref Range   BP 122/71 mmHg   Area-P 1/2 3.36 cm2   S' Lateral 2.80 cm   P 1/2 time 447 msec   Est EF 70 - 75%   CBC     Status: Abnormal   Collection Time: 11/20/22 10:05 AM  Result Value Ref Range   WBC 6.0 4.0 - 10.5 K/uL   RBC 4.28 3.87 - 5.11 MIL/uL   Hemoglobin 11.8 (L) 12.0 - 15.0 g/dL   HCT 16.1 09.6 - 04.5 %   MCV 87.4 80.0 - 100.0 fL   MCH 27.6 26.0 - 34.0 pg   MCHC 31.6 30.0 - 36.0 g/dL   RDW 40.9 81.1 - 91.4 %   Platelets 245 150 - 400 K/uL   nRBC 0.0 0.0 - 0.2 %    Comment: Performed at [facility], [address], [place], [place] [address]  RPR     Status: None   Collection Time: 11/20/22 10:05 AM  Result Value Ref Range   RPR Ser Ql NON REACTIVE NON REACTIVE    Comment: Performed at [facility], [address] [place], [place] [address]  Type and screen     Status: None (Preliminary result)   Collection Time: 11/20/22 10:09 AM  Result Value Ref Range   ABO/RH(D) O POS    Antibody Screen NEG    Sample Expiration      11/23/2022,2359 Performed at [facility], [address] [place], [place] [address]    Unit Number [id number]    Blood Component Type RED CELLS,LR    Unit division 00    Status of Unit ALLOCATED    Transfusion Status OK TO TRANSFUSE    Crossmatch Result Compatible    Unit Number [id number]    Blood Component Type RED CELLS,LR    Unit division 00    Status of Unit ALLOCATED    Transfusion Status OK TO TRANSFUSE    Crossmatch Result Compatible    Unit Number [id number]    Blood Component Type RED CELLS,LR    Unit division 00    Status of Unit ALLOCATED    Transfusion Status OK TO TRANSFUSE    Crossmatch Result Compatible     Unit Number [id number]    Blood Component Type RED CELLS,LR    Unit division 00    Status of Unit ALLOCATED    Transfusion Status OK TO TRANSFUSE    Crossmatch Result Compatible   BPAM RBC     Status: None (Preliminary result)   Collection Time: 11/20/22 10:09 AM  Result Value Ref Range   Blood Product Unit Number [id number]    PRODUCT CODE E0382V00    Unit Type and Rh 5100    Blood Product Expiration Date 416606301601    Blood Product Unit Number [id number]    PRODUCT CODE E0382V00    Unit Type and Rh 5100    Blood Product Expiration Date 542706237628    Blood Product Unit Number [id number]    PRODUCT CODE E0382V00    Unit Type and Rh 5100    Blood Product Expiration Date 062694854627    Blood Product Unit Number [id number]    PRODUCT CODE B7169C78    Unit Type and Rh 5100    Blood Product Expiration Date 938101751025   Glucose, capillary     Status: None   Collection Time: 11/23/22 12:47 PM  Result Value Ref Range   Glucose-Capillary 84 70 - 99 mg/dL    Comment: Glucose reference range applies only to samples taken after fasting for at least 8 hours.  Prepare RBC (crossmatch)     Status: None   Collection Time: 11/23/22  1:00 PM  Result Value Ref Range   Order Confirmation      ORDER PROCESSED BY BLOOD BANK Performed at [facility], [address], [place], [place] [address]   CBC upon arrival     Status: Abnormal   Collection Time: 11/23/22  1:45 PM  Result Value Ref Range   WBC 7.4 4.0 - 10.5 K/uL   RBC 3.98 3.87 - 5.11 MIL/uL   Hemoglobin 11.0 (L) 12.0 - 15.0 g/dL   HCT 82.4 (L) 23.5 - 36.1 %   MCV 87.7 80.0 - 100.0 fL   MCH 27.6 26.0 - 34.0 pg   MCHC 31.5 30.0 - 36.0 g/dL  RDW 15.0 11.5 - 15.5 %   Platelets 229 150 - 400 K/uL   nRBC 0.3 (H) 0.0 - 0.2 %    Comment: Performed at [facility], [address], [place], [place] [address]  Protime-INR upon arrival     Status: None   Collection Time: 11/23/22  1:45 PM  Result Value  Ref Range   Prothrombin Time 15.1 11.4 - 15.2 seconds   INR 1.2 0.8 - 1.2    Comment: (NOTE) INR goal varies based on device and disease states. Performed at [facility], [address], [place], [place] [address]     Assessment/Plan: 37 y/o [id number] at 36 weeks 1 day EGA, with history of 4 prior cesarean sections, with possible placenta accreta here for a repeat cesarean section, removal of fallopian tubes for sterilization, possible cesarean hysterectomy and placement of vessel balloons by IR.   - Admit to [facility].  These procedures has been fully reviewed with the patient and written informed consent has been obtained.  - We discussed risks that include but are not limited to risks of bleeding, infection, damage to organs, possible need for additional procedures.  She desires servation of her uterus if safe. All her questions were answered and she expressed understanding of the procedure.   Prescilla Sours, MD.  11/23/2022, 2:13 PM

## 2022-11-23 NOTE — Progress Notes (Signed)
Re prep patient after IR place bilateral occlusion balloons

## 2022-11-23 NOTE — Transfer of Care (Signed)
Immediate Anesthesia Transfer of Care Note  Patient: Nicole Landry  Procedure(s) Performed: CESAREAN SECTION WITH BILATERAL TUBAL LIGATION (Bilateral) APPLICATION OF CELL SAVER HYSTERECTOMY ABDOMINAL  Patient Location: PACU  Anesthesia Type:MAC combined with regional for post-op pain  Level of Consciousness: awake, alert , and oriented  Airway & Oxygen Therapy: Patient Spontanous Breathing and Patient connected to nasal cannula oxygen  Post-op Assessment: Report given to RN and Post -op Vital signs reviewed and stable  Post vital signs: Reviewed and stable  Last Vitals:  Vitals Value Taken Time  BP 145/95 11/23/22 1801  Temp    Pulse 109 11/23/22 1801  Resp 23 11/23/22 1801  SpO2      Last Pain:  Vitals:   11/23/22 1249  TempSrc:   PainSc: 0-No pain         Complications: No notable events documented.

## 2022-11-23 NOTE — Op Note (Incomplete)
Patient: Nicole Landry DOB: [birth year] MRN:  [id number]  DATE OF SURGERY: 11/23/22.    PREOP DIAGNOSIS:  1. 36 week 1 day EGA IUP. 2.  History of 4 prior cesarean section and is for a repeat cesarean delivery. 3. Multiparous patient desiring permanent sterilization.  4. History of ectopic pregnancy and s/p right salpingectomy.  5. Possible placenta accreta seen on ultrasound.  6. BMI 44.   POSTOP DIAGNOSIS: Same as above.    PROCEDURES:  1. Repeat low uterine segment transverse cesarean section via Pfannenstiel incision.  2. Postpartum bilateral tubal ligation via left complete salpingectomy.  3. Selective catheterization of the bilateral internal iliac arteries by IR, Dr. Marliss Coots.    SURGEON: Dr.  Hoover Browns.  ASSISTANT: Dr. Osborn Coho.  SURGEON ATTESTATION: I was present and scrubbed for the entire case.  An experienced assistant was required given the standard of surgical care and the complexity of the case.  The assistant was needed for exposure, dissection, suctioning, retraction, instrument exchange,  assisting with delivery with administration of fundal pressure, and for overall help during the surgery.    ANESTHESIA: Combined Spinal- Epidural, Dr. Arrie Aran.   COMPLICATIONS: None  FINDINGS: Viable female infant in cephalic presentation, DOA position, weight 6 lbs 12.6 oz, Apgar scores of  9 and 9. Normal uterus with three subcentimeter subserosal fibroids. Normal bilateral ovaries.  Absent right fallopian rube.  Normal left fallopian tube.     EBL:  614 cc cc  Cell saver blood collected and given back to the patient: 110cc.   IV FLUID:  2800 cc LR   URINE OUTPUT:  150 cc clear urine  INDICATIONS:  37 y/o Para 5 with a history of possible placenta accreta who presented for a repeat cesarean and tubal ligation, possible cesarean hysterectomy and internal iliac balloon placement by IR at 36 weeks 1 day EGA.   She was consented for the procedures after  explaining risks benefits and alternatives of the procedures including but not limited to risks of heavy bleeding, infection and damage to organs.  We additionally discussed risks of tubal ligation regret but she stated she was 100% sure she did not want any more children.  She understood there were other kinds of birth control such as pills, patches, IUDs, vaginal rings and depo provera which were temporary but she did not desire them.  She understood there was also an option of female sterilization but she did not desire that option either.  She desired bilateral complete salpingectomy for sterilization, but with a history of removal of right fallopian tube for a ruptured ectopic pregnancy.  All her questions were answered pre-operatively and consent forms were signed.  Marland Kitchen   PROCEDURE:  She was taken to the operating room where combined-spinal anesthesia was administered.  Traxi panniculus manipulator was placed on her.  She was prepped abdominally with chloraprep and vaginally with hibiclens.  She was draped in the usual sterile fashion after foley catheter and SCDs were placed. She received 2 g IV ancef preoperatively.  Selective catheterization of the bilateral internal iliac arteries was performed by Dr. Elby Showers, Domingo Dimes (please see his op note for more details).  The drape used by Dr. Domingo Dimes was removed and patient was re prepped and draped.  Anesthesia was found to be adequate at that point as patient did not feel any pain from allis clamp placed on abdomen.  A pfannenstiel  incision was then made and extended through the subcutaneous layer and also the fascia  with the bovie. Small perforators in the subcutaneous layer were contained with the Bovie. The fascia was nicked in the midline and then was further separated from the rectus muscles bilaterally using Mayo scissors. At this point patient was uncomfortable with the procedure and anesthesia team administered more anesthesia to her and got her more  comfortable. Procedure then continued whereby kochers were placed inferiorly and then superiorly to allow further separation of fascia from the rectus muscles with Mayo scissors.  The peritoneal cavity was entered  sharply and extended longitudinally.  The Alexis retractor was placed in. The vesicouterine fold dissection was created using Metzenbaum scissors.  The uterus was incised with a scalpel transversely and the incision was extended bluntly bilaterally with fingers.  Membranes were ruptured and moderate clear amniotic fluid was noted.  The head was delivered with head flexion and fundal pressure then the rest of the body was delivered with fundal pressure.  She delivered a viable female infant, apgar scores 9, 9.  The edges of the uterus were grasped with T clamps.  The cord was clamped and cut after 1 minute. Cord blood was collected.    The uterus was exteriorized.  The placenta was delivered with gentle traction on the umbilical cord.  It was noted to come out in its entirety without resistance.  The uterus was cleared of clots and debris with a lap.  The uterine incision was closed with #1 Vicryl in a running locked stitch. A small area that bled on the right side was contained with figure of 8 stitch.  A lap was placed over the incision and attention turned to the tubes.    The left  fallopian tube was then identified, followed up to the fimbria end and the hand held Ligasure impact was used to completely transect the fallopian tube from the mesosalpinx leaving about a 0.5 cm cornual stump.  Excellent hemostasis was noted on the remaining pedicles.  The uterine cornua and remaining mesosalpinx were hemostatic.  The uterus was returned to the abdomen and covered.  IR was allowed to remove the catheters as patient was stable and not bleeding much at that point, we did not need to use the vessel balloons.  Irrigation was applied and suctioned out. Patient became more uncomfortable at this point and  anesthesia team gave her more medication.  0.25 % Marcaine, 20cc was also instilled into the pelvic cavity for better pain control.   Patient improved with pain control and surgery continued.  The uterine incision was noted to be hemostatic.  The peritoneum was then reapproximated using 2-0 chromic suture.  Rectus muscles was re approximated with 2-0 chromic.  Fascia was closed using # 0 looped PDS.  The subcutaneous layer was irrigated and suctioned out. Small perforators were contained with the bovie.  The subcutaneous layer was closed using 1-0 plain in interrupted stitches.  4- 0 vicryl on the keith needle was used to close the incision in a subcuticular stitch.  Patient was cleaned and dried.  Benzocaine, steri strips and honey comb dressings were applied.   Tegaderm dressings were replaced over the bilateral groin incisions where IR had made the small incisions.  She was further cleaned and then taken to the recovery room in stable condition.  Her baby had already been taken to the recovery room in stable condition, just before patient was taken there.       SPECIMEN:  Placenta to pathology.   Umbilical cord blood to lab.  Left fallopian  tube to pathology. .   DISPOSITION: TO PACU, STABLE.   Dr. Hoover Browns.  Date: 11/23/2022.

## 2022-11-24 ENCOUNTER — Encounter (HOSPITAL_COMMUNITY): Payer: Self-pay | Admitting: Obstetrics & Gynecology

## 2022-11-24 ENCOUNTER — Encounter: Payer: Self-pay | Admitting: Adult Health

## 2022-11-24 DIAGNOSIS — I10 Essential (primary) hypertension: Secondary | ICD-10-CM | POA: Diagnosis present

## 2022-11-24 LAB — TYPE AND SCREEN
Unit division: 0
Unit division: 0

## 2022-11-24 LAB — COMPREHENSIVE METABOLIC PANEL
ALT: 12 U/L (ref 0–44)
AST: 19 U/L (ref 15–41)
Albumin: 2.4 g/dL — ABNORMAL LOW (ref 3.5–5.0)
Alkaline Phosphatase: 55 U/L (ref 38–126)
Anion gap: 7 (ref 5–15)
BUN: 5 mg/dL — ABNORMAL LOW (ref 6–20)
CO2: 21 mmol/L — ABNORMAL LOW (ref 22–32)
Calcium: 7.9 mg/dL — ABNORMAL LOW (ref 8.9–10.3)
Chloride: 102 mmol/L (ref 98–111)
Creatinine, Ser: 0.53 mg/dL (ref 0.44–1.00)
GFR, Estimated: >60 mL/min (ref >60)
Glucose, Bld: 81 mg/dL (ref 70–99)
Potassium: 3.5 mmol/L (ref 3.5–5.1)
Sodium: 130 mmol/L — ABNORMAL LOW (ref 135–145)
Total Bilirubin: 0.6 mg/dL (ref 0.3–1.2)
Total Protein: 5.2 g/dL — ABNORMAL LOW (ref 6.5–8.1)

## 2022-11-24 LAB — CBC
HCT: 28.4 % — ABNORMAL LOW (ref 36.0–46.0)
Hemoglobin: 9 g/dL — ABNORMAL LOW (ref 12.0–15.0)
MCH: 28 pg (ref 26.0–34.0)
MCHC: 31.7 g/dL (ref 30.0–36.0)
MCV: 88.5 fL (ref 80.0–100.0)
Platelets: 192 10*3/uL (ref 150–400)
RBC: 3.21 MIL/uL — ABNORMAL LOW (ref 3.87–5.11)
RDW: 14.8 % (ref 11.5–15.5)
WBC: 12 10*3/uL — ABNORMAL HIGH (ref 4.0–10.5)
nRBC: 0 % (ref 0.0–0.2)

## 2022-11-24 LAB — BPAM RBC
Blood Product Expiration Date: 202406162359
Blood Product Expiration Date: 202406212359
Blood Product Expiration Date: 202406212359
ISSUE DATE / TIME: 202405201459
ISSUE DATE / TIME: 202405201459
ISSUE DATE / TIME: 202405201459
Unit Type and Rh: 5100
Unit Type and Rh: 5100
Unit Type and Rh: 5100

## 2022-11-24 LAB — GLUCOSE, CAPILLARY: Glucose-Capillary: 82 mg/dL (ref 70–99)

## 2022-11-24 LAB — PROTEIN / CREATININE RATIO, URINE
Creatinine, Urine: 232 mg/dL
Protein Creatinine Ratio: 0.19 mg/mg{Cre} — ABNORMAL HIGH (ref 0.00–0.15)
Total Protein, Urine: 44 mg/dL

## 2022-11-24 MED ORDER — LACTATED RINGERS IV SOLN
INTRAVENOUS | Status: DC
  Filled 2022-11-24 (×3): qty 1000

## 2022-11-24 MED ORDER — LACTATED RINGERS IV BOLUS
500.0000 mL | Freq: Once | INTRAVENOUS | Status: AC
  Administered 2022-11-24: 500 mL via INTRAVENOUS

## 2022-11-24 MED ORDER — POLYSACCHARIDE IRON COMPLEX 150 MG PO CAPS
150.0000 mg | ORAL_CAPSULE | Freq: Every day | ORAL | Status: DC
  Administered 2022-11-24 – 2022-11-28 (×5): 150 mg via ORAL
  Filled 2022-11-24 (×6): qty 1

## 2022-11-24 NOTE — Progress Notes (Signed)
Patient screened out for psychosocial assessment since none of the following apply:  Psychosocial stressors documented in mother or baby's chart  Gestation less than 32 weeks  Code at delivery   Infant with anomalies Please contact the Clinical Social Worker if specific needs arise, by MOB's request, or if MOB scores greater than 9/yes to question 10 on Edinburgh Postpartum Depression Screen.  Mckenleigh Tarlton, LCSW Clinical Social Worker [facility] Cell#: [phone]     
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
## 2022-11-24 NOTE — Lactation Note (Signed)
This note was copied from a baby's chart.  NICU Lactation Consultation Note  Patient Name: Boy Sammijo Naval VHQIO'N Date: 11/24/2022 Age:37 years  Reason for consult: Initial assessment; NICU baby; Late-preterm 34-36.6wks; Maternal endocrine disorder Type of Endocrine Disorder?: Diabetes (GDM)  SUBJECTIVE  LC in to visit with P6 Mom of LPTI that was transferred to the NICU due to elevated bilirubin due to ABO incompatibility requiring triple phototherapy.  Baby is on scheduled gavage feedings of formula.  Mom had declined lactation on MBU, but due to baby's admission to NICU, LC introduced herself.  LC offered to set up DEBP due to LPT and hyperbilirubinemia, but Mom states after she uses the bathroom, she plans to breastfeed.  Mom reports baby had been breastfeeding before he came to NICU.  LC recommended pumping to support her milk supply, due to baby being a month early.  Mom didn't respond.    LC asked her to let her RN know when she is ready to begin pumping.  OBJECTIVE Infant data: No data recorded Infant feeding assessment Scale for Readiness: 3 Scale for Quality: 3   Maternal data: [id number]  C-Section, Low Transverse No data recorded    ASSESSMENT Infant: Feeding Status: Scheduled 9-12-3-6  Maternal: No data recorded INTERVENTIONS/PLAN Interventions: No data recorded Plan: Consult Status: NICU follow-up NICU Follow-up type: New admission follow up   Judee Clara 11/24/2022, 10:24 AM

## 2022-11-24 NOTE — Progress Notes (Signed)
Referring Physician(s): Dr. Normand Sloop  Supervising Physician: Gilmer Mor  Patient Status:  [facility] - In-pt  Chief Complaint: 42 week's gestation with possible placenta accreta presented for elective Cesarean section. Dr. Elby Showers performed prophylactic bilateral internal iliac artery balloon occlusion during the C-section.    Subjective: Patient sitting up in the chair with her new baby boy resting in his isolette under the bili lights. Patient's husband arrived to the room during my visit. She feels well and has only normal c-section pain/discomfort.   Allergies: Food, Povidone iodine, and Vicodin [hydrocodone-acetaminophen]  Medications: Prior to Admission medications   Medication Sig Start Date End Date Taking? Authorizing Provider  calcium carbonate (TUMS - DOSED IN MG ELEMENTAL CALCIUM) 500 MG chewable tablet Chew 1-2 tablets by mouth 3 (three) times daily as needed for indigestion or heartburn.   Yes [provider]  Cyanocobalamin (B-12 COMPLIANCE INJECTION) 1000 MCG/ML KIT Inject 1,000 mcg as directed every 30 (thirty) days.   Yes [provider]  enoxaparin (LOVENOX) 60 MG/0.6ML injection Inject 60 mg into the skin every 12 (twelve) hours.   Yes [provider]  Prenatal Vit-Fe Fumarate-FA (PRENATAL MULTIVITAMIN) TABS tablet Take 1 tablet by mouth in the morning.   Yes [provider]     Vital Signs: BP 118/73 (BP Location: Left Arm)   Pulse 76   Temp 98.7 F (37.1 C) (Oral)   Resp 18   Ht 5\' 3"  (1.6 m)   Wt 249 lb (112.9 kg)   LMP 03/15/2022   SpO2 97%   Breastfeeding Yes   BMI 44.11 kg/m   Physical Exam Constitutional:      General: She is not in acute distress.    Appearance: She is not ill-appearing.  Cardiovascular:     Pulses: Normal pulses.     Comments: Right and left groin sites are clean, soft, dry and non-tender. Strong pedal pulses bilaterally.  Pulmonary:     Effort: Pulmonary effort is normal.  Abdominal:      Tenderness: There is abdominal tenderness.     Comments: C-section incision - not viewed  Musculoskeletal:     Right lower leg: No edema.     Left lower leg: No edema.  Skin:    General: Skin is warm and dry.  Neurological:     Mental Status: She is alert and oriented to person, place, and time.  Psychiatric:        Mood and Affect: Mood normal.        Behavior: Behavior normal.        Thought Content: Thought content normal.        Judgment: Judgment normal.     Imaging: IR US Guide Vasc Access Right  Result Date: 11/23/2022 INDICATION: 37 year old G65P5 female at [redacted] weeks gestation with possible placenta accreta presenting for elective Cesarian section. Interventional radiology requested for prophylactic bilateral internal iliac artery balloon placement prior to delivery. EXAM: 1. Ultrasound-guided vascular access of the right common femoral artery. 2. Ultrasound-guided vascular access of left common femoral artery. 3. Selective catheterization and angiography of the bilateral internal iliac arteries. 4. Prophylactic occlusion balloon placement the bilateral internal iliac arteries. MEDICATIONS: As per anesthesia record. ANESTHESIA/SEDATION: As per anesthesia record. CONTRAST:  20mL OMNIPAQUE IOHEXOL 300 MG/ML  SOLN FLUOROSCOPY: Radiation Exposure Index (as provided by the fluoroscopic device): 218.69 mGy Kerma COMPLICATIONS: None immediate. PROCEDURE: Informed consent was obtained from the patient following explanation of the procedure, risks, benefits and alternatives. The patient understands, agrees and  consents for the procedure. All questions were addressed. A time out was performed prior to the initiation of the procedure. Maximal barrier sterile technique utilized including caps, mask, sterile gowns, sterile gloves, large sterile drape, hand hygiene, and Betadine prep. This procedure was performed in concert with the obstetric is team in the obstetric operating room. The bilateral groins  were prepped and draped in standard fashion. Preprocedure ultrasound evaluation of the bilateral common femoral arteries demonstrated patency. The procedure was planned. A small skin nick was made in the right groin at the planned needle entry site. Under direct ultrasound visualization, a 21 gauge micropuncture needle was directed into the right common femoral artery. The microwire was inserted the micropuncture sheath was placed. An 19 J wire was then inserted with ease. Next, small skin nick was made in the left groin at the planned needle entry site. Under direct ultrasound visualization, a 21 gauge micropuncture needle was directed into the left common femoral artery. The microwire was inserted in the micropuncture sheath was placed. An 52 J wire was then inserted with ease. There is fluoroscopic confirmation of expected location of the wires with their tips in the abdominal aorta. Each of the micropuncture sheath was exchanged over the wire for a 6 French, 45 cm angled tip Destination sheath. The sheaths were positioned in the common iliac arteries bilaterally. Starting on the right side, an Omni flush catheter was inserted through the sheath and used to direct a Glidewire into the left external iliac artery. The catheter was exchanged for a 5 French lip C2 catheter which under fluoroscopic guidance was retracted into the internal iliac artery ostium. A J wire was inserted into the internal iliac artery distally. The catheter was then exchanged for a 5 French, 11 mm Fogarty occlusion balloon. The Fogarty balloon was inflated till visualized conforming to the internal iliac artery vessel wall then deflated. The exact procedure was performed on the right side until there were indwelling bilateral internal iliac artery occlusion balloon catheters. Cesarian section was then performed without complication. Prior to peritoneal closure, the bilateral occlusion balloon catheters were removed over the wires. Next,  bilateral 6 French sheath were exchanged for 6 French Angio-Seal devices which were deployed bilaterally without complication. Peripheral pulses were unchanged. The patient tolerated the procedure well and remained under the care of the Obstetrics and Anesthesia teams. IMPRESSION: Technically successful catheterization and angiography of the bilateral internal iliac arteries with placement of occlusion balloon catheters for Cesarian section perioperative hemorrhage control. Marliss Coots, MD [facility] Radiology Electronically Signed   By: Marliss Coots M.D.   On: 11/23/2022 20:30   IR US Guide Vasc Access Left  Result Date: 11/23/2022 INDICATION: 37 year old G8P5 female at [redacted] weeks gestation with possible placenta accreta presenting for elective Cesarian section. Interventional radiology requested for prophylactic bilateral internal iliac artery balloon placement prior to delivery. EXAM: 1. Ultrasound-guided vascular access of the right common femoral artery. 2. Ultrasound-guided vascular access of left common femoral artery. 3. Selective catheterization and angiography of the bilateral internal iliac arteries. 4. Prophylactic occlusion balloon placement the bilateral internal iliac arteries. MEDICATIONS: As per anesthesia record. ANESTHESIA/SEDATION: As per anesthesia record. CONTRAST:  20mL OMNIPAQUE IOHEXOL 300 MG/ML  SOLN FLUOROSCOPY: Radiation Exposure Index (as provided by the fluoroscopic device): 218.69 mGy Kerma COMPLICATIONS: None immediate. PROCEDURE: Informed consent was obtained from the patient following explanation of the procedure, risks, benefits and alternatives. The patient understands, agrees and consents for the procedure. All questions were  addressed. A time out was performed prior to the initiation of the procedure. Maximal barrier sterile technique utilized including caps, mask, sterile gowns, sterile gloves, large sterile drape, hand  hygiene, and Betadine prep. This procedure was performed in concert with the obstetric is team in the obstetric operating room. The bilateral groins were prepped and draped in standard fashion. Preprocedure ultrasound evaluation of the bilateral common femoral arteries demonstrated patency. The procedure was planned. A small skin nick was made in the right groin at the planned needle entry site. Under direct ultrasound visualization, a 21 gauge micropuncture needle was directed into the right common femoral artery. The microwire was inserted the micropuncture sheath was placed. An 44 J wire was then inserted with ease. Next, small skin nick was made in the left groin at the planned needle entry site. Under direct ultrasound visualization, a 21 gauge micropuncture needle was directed into the left common femoral artery. The microwire was inserted in the micropuncture sheath was placed. An 67 J wire was then inserted with ease. There is fluoroscopic confirmation of expected location of the wires with their tips in the abdominal aorta. Each of the micropuncture sheath was exchanged over the wire for a 6 French, 45 cm angled tip Destination sheath. The sheaths were positioned in the common iliac arteries bilaterally. Starting on the right side, an Omni flush catheter was inserted through the sheath and used to direct a Glidewire into the left external iliac artery. The catheter was exchanged for a 5 French lip C2 catheter which under fluoroscopic guidance was retracted into the internal iliac artery ostium. A J wire was inserted into the internal iliac artery distally. The catheter was then exchanged for a 5 French, 11 mm Fogarty occlusion balloon. The Fogarty balloon was inflated till visualized conforming to the internal iliac artery vessel wall then deflated. The exact procedure was performed on the right side until there were indwelling bilateral internal iliac artery occlusion balloon catheters. Cesarian section  was then performed without complication. Prior to peritoneal closure, the bilateral occlusion balloon catheters were removed over the wires. Next, bilateral 6 French sheath were exchanged for 6 French Angio-Seal devices which were deployed bilaterally without complication. Peripheral pulses were unchanged. The patient tolerated the procedure well and remained under the care of the Obstetrics and Anesthesia teams. IMPRESSION: Technically successful catheterization and angiography of the bilateral internal iliac arteries with placement of occlusion balloon catheters for Cesarian section perioperative hemorrhage control. Marliss Coots, MD [facility] Radiology Electronically Signed   By: Marliss Coots M.D.   On: 11/23/2022 20:30   IR HYBRID TRAUMA EMBOLIZATION  Result Date: 11/23/2022 INDICATION: 37 year old G37P5 female at [redacted] weeks gestation with possible placenta accreta presenting for elective Cesarian section. Interventional radiology requested for prophylactic bilateral internal iliac artery balloon placement prior to delivery. EXAM: 1. Ultrasound-guided vascular access of the right common femoral artery. 2. Ultrasound-guided vascular access of left common femoral artery. 3. Selective catheterization and angiography of the bilateral internal iliac arteries. 4. Prophylactic occlusion balloon placement the bilateral internal iliac arteries. MEDICATIONS: As per anesthesia record. ANESTHESIA/SEDATION: As per anesthesia record. CONTRAST:  20mL OMNIPAQUE IOHEXOL 300 MG/ML  SOLN FLUOROSCOPY: Radiation Exposure Index (as provided by the fluoroscopic device): 218.69 mGy Kerma COMPLICATIONS: None immediate. PROCEDURE: Informed consent was obtained from the patient following explanation of the procedure, risks, benefits and alternatives. The patient understands, agrees and consents for the procedure. All questions were addressed. A time out was performed prior to the  initiation of the procedure. Maximal barrier sterile technique utilized including caps, mask, sterile gowns, sterile gloves, large sterile drape, hand hygiene, and Betadine prep. This procedure was performed in concert with the obstetric is team in the obstetric operating room. The bilateral groins were prepped and draped in standard fashion. Preprocedure ultrasound evaluation of the bilateral common femoral arteries demonstrated patency. The procedure was planned. A small skin nick was made in the right groin at the planned needle entry site. Under direct ultrasound visualization, a 21 gauge micropuncture needle was directed into the right common femoral artery. The microwire was inserted the micropuncture sheath was placed. An 81 J wire was then inserted with ease. Next, small skin nick was made in the left groin at the planned needle entry site. Under direct ultrasound visualization, a 21 gauge micropuncture needle was directed into the left common femoral artery. The microwire was inserted in the micropuncture sheath was placed. An 62 J wire was then inserted with ease. There is fluoroscopic confirmation of expected location of the wires with their tips in the abdominal aorta. Each of the micropuncture sheath was exchanged over the wire for a 6 French, 45 cm angled tip Destination sheath. The sheaths were positioned in the common iliac arteries bilaterally. Starting on the right side, an Omni flush catheter was inserted through the sheath and used to direct a Glidewire into the left external iliac artery. The catheter was exchanged for a 5 French lip C2 catheter which under fluoroscopic guidance was retracted into the internal iliac artery ostium. A J wire was inserted into the internal iliac artery distally. The catheter was then exchanged for a 5 French, 11 mm Fogarty occlusion balloon. The Fogarty balloon was inflated till visualized conforming to the internal iliac artery vessel wall then deflated. The  exact procedure was performed on the right side until there were indwelling bilateral internal iliac artery occlusion balloon catheters. Cesarian section was then performed without complication. Prior to peritoneal closure, the bilateral occlusion balloon catheters were removed over the wires. Next, bilateral 6 French sheath were exchanged for 6 French Angio-Seal devices which were deployed bilaterally without complication. Peripheral pulses were unchanged. The patient tolerated the procedure well and remained under the care of the Obstetrics and Anesthesia teams. IMPRESSION: Technically successful catheterization and angiography of the bilateral internal iliac arteries with placement of occlusion balloon catheters for Cesarian section perioperative hemorrhage control. Marliss Coots, MD [facility] Radiology Electronically Signed   By: Marliss Coots M.D.   On: 11/23/2022 20:30   IR Angiogram Pelvis Selective Or Supraselective  Result Date: 11/23/2022 INDICATION: 37 year old G103P5 female at [redacted] weeks gestation with possible placenta accreta presenting for elective Cesarian section. Interventional radiology requested for prophylactic bilateral internal iliac artery balloon placement prior to delivery. EXAM: 1. Ultrasound-guided vascular access of the right common femoral artery. 2. Ultrasound-guided vascular access of left common femoral artery. 3. Selective catheterization and angiography of the bilateral internal iliac arteries. 4. Prophylactic occlusion balloon placement the bilateral internal iliac arteries. MEDICATIONS: As per anesthesia record. ANESTHESIA/SEDATION: As per anesthesia record. CONTRAST:  20mL OMNIPAQUE IOHEXOL 300 MG/ML  SOLN FLUOROSCOPY: Radiation Exposure Index (as provided by the fluoroscopic device): 218.69 mGy Kerma COMPLICATIONS: None immediate. PROCEDURE: Informed consent was obtained from the patient following explanation of the procedure,  risks, benefits and alternatives. The patient understands, agrees and consents for the procedure. All questions were addressed. A time out was performed prior to the initiation of the procedure. Maximal barrier sterile  technique utilized including caps, mask, sterile gowns, sterile gloves, large sterile drape, hand hygiene, and Betadine prep. This procedure was performed in concert with the obstetric is team in the obstetric operating room. The bilateral groins were prepped and draped in standard fashion. Preprocedure ultrasound evaluation of the bilateral common femoral arteries demonstrated patency. The procedure was planned. A small skin nick was made in the right groin at the planned needle entry site. Under direct ultrasound visualization, a 21 gauge micropuncture needle was directed into the right common femoral artery. The microwire was inserted the micropuncture sheath was placed. An 84 J wire was then inserted with ease. Next, small skin nick was made in the left groin at the planned needle entry site. Under direct ultrasound visualization, a 21 gauge micropuncture needle was directed into the left common femoral artery. The microwire was inserted in the micropuncture sheath was placed. An 13 J wire was then inserted with ease. There is fluoroscopic confirmation of expected location of the wires with their tips in the abdominal aorta. Each of the micropuncture sheath was exchanged over the wire for a 6 French, 45 cm angled tip Destination sheath. The sheaths were positioned in the common iliac arteries bilaterally. Starting on the right side, an Omni flush catheter was inserted through the sheath and used to direct a Glidewire into the left external iliac artery. The catheter was exchanged for a 5 French lip C2 catheter which under fluoroscopic guidance was retracted into the internal iliac artery ostium. A J wire was inserted into the internal iliac artery distally. The catheter was then exchanged for a  5 French, 11 mm Fogarty occlusion balloon. The Fogarty balloon was inflated till visualized conforming to the internal iliac artery vessel wall then deflated. The exact procedure was performed on the right side until there were indwelling bilateral internal iliac artery occlusion balloon catheters. Cesarian section was then performed without complication. Prior to peritoneal closure, the bilateral occlusion balloon catheters were removed over the wires. Next, bilateral 6 French sheath were exchanged for 6 French Angio-Seal devices which were deployed bilaterally without complication. Peripheral pulses were unchanged. The patient tolerated the procedure well and remained under the care of the Obstetrics and Anesthesia teams. IMPRESSION: Technically successful catheterization and angiography of the bilateral internal iliac arteries with placement of occlusion balloon catheters for Cesarian section perioperative hemorrhage control. Marliss Coots, MD [facility] Radiology Electronically Signed   By: Marliss Coots M.D.   On: 11/23/2022 20:30   DG C-Arm 1-60 Min-No Report  Result Date: 11/23/2022 Fluoroscopy was utilized by the requesting physician.  No radiographic interpretation.   DG C-Arm 1-60 Min-No Report  Result Date: 11/23/2022 Fluoroscopy was utilized by the requesting physician.  No radiographic interpretation.    Labs:  CBC: Recent Labs    08/20/22 1229 11/20/22 1005 11/23/22 1345 11/24/22 0553  WBC 6.7 6.0 7.4 12.0*  HGB 11.0* 11.8* 11.0* 9.0*  HCT 34.2*  35.3 37.4 34.9* 28.4*  PLT 298 245 229 192    COAGS: Recent Labs    11/23/22 1345  INR 1.2    BMP: Recent Labs    07/07/22 1551 08/20/22 1229 11/24/22 0553  NA 137 138 130*  K 3.6 3.8 3.5  CL 103 104 102  CO2 25 26 21*  GLUCOSE 74 97 81  BUN 6 5* 5*  CALCIUM 10.0 9.0 7.9*  CREATININE 0.45 0.39* 0.53  GFRNONAA >60 >60 >60    LIVER FUNCTION TESTS: Recent Labs  07/07/22 1551 08/20/22 1229 11/24/22 0553  BILITOT 0.3 0.2* 0.6  AST 10* 8* 19  ALT 6 6 12   ALKPHOS 44 43 55  PROT 7.7 6.8 5.2*  ALBUMIN 3.9 3.8 2.4*    Assessment and Plan:  66 week's gestation with possible placenta accreta presented for elective Cesarean section. Dr. Elby Showers performed prophylactic bilateral internal iliac artery balloon occlusion during the C-section.    Patient is doing well post-procedure. Right and left groin sites are clean, soft, dry and non-tender.  She is afebrile; drop in hemoglobin to 9 which is likely to be expected post c-section. Patient with c-section related pain/discomfort. IR recommends to continue monitoring H&H, ok to remove right and left groin dressings 11/25/22.  Other plans per Obstetric team.  Please call IR with any questions.   Electronically Signed: Alwyn Ren, AGACNP-BC [phone] 11/24/2022, 12:49 PM   I spent a total of 15 Minutes at the the patient's bedside AND on the patient's hospital floor or unit, greater than 50% of which was counseling/coordinating care s/p Prophylactic occlusion balloon placement the bilateral internal iliac arteries.

## 2022-11-24 NOTE — Anesthesia Postprocedure Evaluation (Signed)
Anesthesia Post Note  Patient: Nicole Landry  Procedure(s) Performed: CESAREAN SECTION WITH BILATERAL TUBAL LIGATION (Bilateral) APPLICATION OF CELL SAVER     Patient location during evaluation: PACU Anesthesia Type: Spinal Level of consciousness: awake, awake and alert and oriented Pain management: pain level controlled Vital Signs Assessment: post-procedure vital signs reviewed and stable Respiratory status: spontaneous breathing, nonlabored ventilation and respiratory function stable Cardiovascular status: blood pressure returned to baseline and stable Postop Assessment: no headache, no backache, spinal receding and no apparent nausea or vomiting Anesthetic complications: no Comments: Epidural removed, tip intact in PACU.   No notable events documented.  Last Vitals:  Vitals:   11/24/22 0300 11/24/22 0900  BP: 101/64 118/73  Pulse: 84 76  Resp: 18 18  Temp: 37.1 C 37.1 C  SpO2: 97%     Last Pain:  Vitals:   11/24/22 0900  TempSrc: Oral  PainSc: 4                  Collene Schlichter

## 2022-11-25 ENCOUNTER — Encounter: Payer: Self-pay | Admitting: Adult Health

## 2022-11-25 LAB — SURGICAL PATHOLOGY

## 2022-11-25 MED ORDER — CYCLOBENZAPRINE HCL 5 MG PO TABS
5.0000 mg | ORAL_TABLET | Freq: Three times a day (TID) | ORAL | Status: DC | PRN
  Administered 2022-11-26: 5 mg via ORAL
  Filled 2022-11-25 (×2): qty 1

## 2022-11-25 MED FILL — Heparin Sodium (Porcine) Inj 1000 Unit/ML: INTRAMUSCULAR | Qty: 30 | Status: AC

## 2022-11-25 MED FILL — Sodium Chloride IV Soln 0.9%: INTRAVENOUS | Qty: 1000 | Status: AC

## 2022-11-25 NOTE — Progress Notes (Signed)
Subjective: Postpartum Day 2: Cesarean Delivery.  Left salpingecto Patient reports tolerating PO, + flatus, + BM and no problems voiding.    Objective: Vital signs in last 24 hours: Temp:  [98.8 F (37.1 C)] 98.8 F (37.1 C) (05/22 0516) Pulse Rate:  [86-91] 91 (05/22 0516) Resp:  [18-20] 18 (05/22 0516) BP: (114-121)/(70-73) 121/70 (05/22 0516) SpO2:  [98 %-99 %] 98 % (05/22 0516)  Physical Exam:  General: alert Lochia: appropriate Uterine Fundus: firm Incision: healing well, no significant drainage, no dehiscence DVT Evaluation: No evidence of DVT seen on physical exam.  Recent Labs    11/23/22 1345 11/24/22 0553  HGB 11.0* 9.0*  HCT 34.9* 28.4*    Assessment/Plan: Status post Cesarean section. Doing well postoperatively.  Continue current care. Flexeril prn back pain   Jovonni Borquez A Samuele Storey 11/25/2022, 1:10 PM

## 2022-11-25 NOTE — Lactation Note (Signed)
This note was copied from a baby's chart.  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 Shauntina has declined lactation services after NICU admission. Let her know that [facility] services are available through her stay and baby's in case she changes her mind. She's a P6 and experienced breastfeeding, she was set up with a pump yesterday but hasn't been pumping consistently, her RN voiced that Ms. Golz's plan is just putting baby to breast and that she has experience doing so. Family aware of LC services and will contact PRN.  OBJECTIVE Infant data: Mother's Current Feeding Choice: Breast Milk and Formula  Infant feeding assessment Scale for Readiness: 3 Scale for Quality: 3   Maternal data: [id number]  C-Section, Low Transverse Current breast feeding challenges:: NICU admission Pumping frequency: not consistently Risk factor for low milk supply:: prematurity, infant separation, GDM  ASSESSMENT Infant: LATCH Documentation Latch: 1 Audible Swallowing: 1 Type of Nipple: 2 Comfort (Breast/Nipple): 2 Hold (Positioning): 2 LATCH Score: 8  Feeding Status: Scheduled 9-12-3-6  Maternal: Milk volume: Normal  INTERVENTIONS/PLAN Interventions: Tools: Pump Pump Education: Setup, frequency, and cleaning  Plan: Consult Status: PRN NICU Follow-up type: Maternal D/C visit   Nyzier Boivin Venetia Constable 11/25/2022, 11:48 AM

## 2022-11-26 ENCOUNTER — Encounter (HOSPITAL_COMMUNITY): Payer: Self-pay | Admitting: Obstetrics & Gynecology

## 2022-11-26 MED ORDER — NIFEDIPINE ER OSMOTIC RELEASE 30 MG PO TB24
30.0000 mg | ORAL_TABLET | Freq: Every day | ORAL | Status: DC
  Administered 2022-11-26 – 2022-11-27 (×2): 30 mg via ORAL
  Filled 2022-11-26 (×2): qty 1

## 2022-11-26 NOTE — Progress Notes (Signed)
Subjective: Postpartum Day 3: Cesarean Delivery Patient reports incisional pain, tolerating PO, and no problems voiding.  Her incisional pain is well controlled with pain medication use.   Objective: Vital signs in last 24 hours: Temp:  [98.2 F (36.8 C)-98.9 F (37.2 C)] 98.2 F (36.8 C) (05/23 1125) Pulse Rate:  [92-100] 97 (05/23 1125) Resp:  [16-20] 16 (05/23 1125) BP: (125-156)/(83-97) 139/89 (05/23 1125) SpO2:  [98 %-99 %] 99 % (05/23 1125)  Physical Exam:  General: alert, cooperative, and no distress Lochia: appropriate Uterine Fundus: firm Incision: With honey comb dressing, two small dry blood patches, otherwise clean, dry and intact.  DVT Evaluation: No evidence of DVT seen on physical exam. Calf/Ankle edema is present.  Recent Labs    11/23/22 1345 11/24/22 0553  HGB 11.0* 9.0*  HCT 34.9* 28.4*    Assessment/Plan: 37 y/o Para 6 POD # 3 s/p repeat Cesarean section and left salpingectomy - With elevated blood pressures and a history of chronic HTN, will start Procardia XL for BP control.  - Lovenox restarted for history of Pulmonary embolus and now postpartum. - Iron tabs for anemia. - ROutine post-op care.   Prescilla Sours, MD 11/26/2022, 11:57 AM

## 2022-11-27 MED ORDER — OXYCODONE HCL 5 MG PO TABS: 5.0000 mg | ORAL_TABLET | ORAL | 0 refills | Status: DC | PRN

## 2022-11-27 MED ORDER — IBUPROFEN 600 MG PO TABS: 600.0000 mg | ORAL_TABLET | Freq: Four times a day (QID) | ORAL | 0 refills | Status: AC

## 2022-11-27 MED ORDER — NIFEDIPINE ER OSMOTIC RELEASE 30 MG PO TB24
30.0000 mg | ORAL_TABLET | Freq: Once | ORAL | Status: AC
  Administered 2022-11-27: 30 mg via ORAL
  Filled 2022-11-27: qty 1

## 2022-11-27 MED ORDER — NIFEDIPINE ER OSMOTIC RELEASE 60 MG PO TB24
90.0000 mg | ORAL_TABLET | Freq: Every day | ORAL | Status: DC
  Administered 2022-11-28: 90 mg via ORAL
  Filled 2022-11-27: qty 1

## 2022-11-27 MED ORDER — OXYCODONE HCL 5 MG PO TABS: 5.0000 mg | ORAL_TABLET | ORAL | 0 refills | Status: AC | PRN

## 2022-11-27 MED ORDER — CYCLOBENZAPRINE HCL 5 MG PO TABS: 5.0000 mg | ORAL_TABLET | Freq: Three times a day (TID) | ORAL | 0 refills | Status: AC | PRN

## 2022-11-27 MED ORDER — NIFEDIPINE ER OSMOTIC RELEASE 60 MG PO TB24: 60.0000 mg | ORAL_TABLET | Freq: Every day | ORAL | Status: DC

## 2022-11-27 MED ORDER — ENOXAPARIN SODIUM 100 MG/ML IJ SOSY: 100.0000 mg | PREFILLED_SYRINGE | INTRAMUSCULAR | 0 refills | Status: AC

## 2022-11-27 MED ORDER — NIFEDIPINE ER OSMOTIC RELEASE 30 MG PO TB24: 30.0000 mg | ORAL_TABLET | Freq: Every day | ORAL | Status: DC

## 2022-11-27 MED ORDER — POLYSACCHARIDE IRON COMPLEX 150 MG PO CAPS: 150.0000 mg | ORAL_CAPSULE | Freq: Every day | ORAL | 0 refills | Status: AC

## 2022-11-27 NOTE — Lactation Note (Signed)
This note was copied from a baby's chart.  NICU Lactation Consultation Note  Patient Name: Nicole Landry ZOXWR'U Date: 11/27/2022 Age:38 days  Reason for consult: Follow-up assessment; Other (Comment); NICU baby; Late-preterm 34-36.6wks; Maternal endocrine disorder (DAT (+)) Type of Endocrine Disorder?: Diabetes (GDM)  SUBJECTIVE Visited with family of 68 hours old LPI NICU female; Nicole Landry is a P6 and experienced breastfeeding. She reports she hasn't been pumping since admission because she's been taking baby to breast. Explained the importance of consistent pumping for the onset of lactogenesis II, she voiced that she has a Hx of oversupply and she can tell that her milk is in. Nicole Landry is getting discharged today. Reviewed discharge education, engorgement prevention/treatment, suggested pumping schedule, lactogenesis II and anticipatory guidelines. She said she feels comfortable practicing independent breastfeeding and politely requested not to be followed up by lactation; she had a bad experienced with one of her previous babies. All questions and concerns answered, family to contact [facility] services PRN.   OBJECTIVE Infant data: Mother's Current Feeding Choice: Breast Milk and Formula  Infant feeding assessment Scale for Readiness: 2   Maternal data: [id number]  C-Section, Low Transverse Significant Breast History:: Hx of oversupply with her other babies Does the patient have breastfeeding experience prior to this delivery?: Yes How long did the patient breastfeed?: She breatfeed her 5 other kids for a total of 13 years combined Pumping frequency: not consistently, she's mostly putting baby to breast Pumped volume: 0 mL (Unable to assess since she hasn't pump in the last two days; but she voiced her breast are filled in with milk)  Pump: Personal (DEBP from [employer])  ASSESSMENT Infant: LATCH Documentation Latch: 1 Audible Swallowing: 1 Type of Nipple: 2 Comfort  (Breast/Nipple): 2 Hold (Positioning): 2 LATCH Score: 8  Feeding Status: Scheduled 9-12-3-6  Maternal: Milk volume: Normal  INTERVENTIONS/PLAN Interventions: Interventions: Breast feeding basics reviewed; DEBP; Education Discharge Education: Engorgement and breast care Tools: Pump Pump Education: Setup, frequency, and cleaning  Plan: Consult Status: Complete (mother declined follow up)   Nicole Landry 11/27/2022, 10:45 AM

## 2022-11-27 NOTE — Discharge Instructions (Signed)
   Nicole Landry, 1. While at home remember to walk regularly, at least 1 hour a day, as this will help with your quick recovery. 2. Do not do any heavy lifting, i.e nothing heavier than 15 lbs for the next 6 weeks 3.  Do not use tampons or douche or take baths, do not have any sexual intercourse or anything inside the vagina for the next 6 weeks.  4. Take your pain medication as needed for pain, let us know if the pain is not well controlled despite pain medication use.  5. Get plenty of rest especially for the next two weeks to allow your body to recover.  You may feel tired in the process, this is normal. 6. Take your iron tablets daily for anemia.  You may also take a stool softener e.g colace if you are constipated.    7.  You may get a fever while at home, if you do, check your temperature and if it is equal to or greater than 100.4 please call the office.   8.  Please keep your upcoming appointment at the offices as scheduled.  9.  Remove the Honey comb dressing one week from your surgery date. To remove the honey comb dressing, first take a shower.  After showering pat the dressing dry then peel it off gently from the corners.  After honey comb comes out there will be steri strips left on the incision.  You may continue showering as usual and the steri strips may get wet.  After showering pat the steri strips dry. Allow the steri strips to fall off by themselves over time.  Do not rub the incision directly or put soap directly over the incision.  Always rinse off any soap over the incision and pat the incision dry.  10.  Some vaginal bleeding is expected and normal after your surgery. Please let us know if if it excessive where you saturate 1 pad in less than 2 hours or so.

## 2022-11-27 NOTE — Progress Notes (Addendum)
Subjective: Postpartum Day 4: Cesarean Delivery Patient reports tolerating PO, + flatus, and no problems voiding.    Objective: Vital signs in last 24 hours: Temp:  [98 F (36.7 C)-98.4 F (36.9 C)] 98 F (36.7 C) (05/24 1223) Pulse Rate:  [88-99] 88 (05/24 1223) Resp:  [17-18] 17 (05/24 1223) BP: (141-150)/(83-90) 143/87 (05/24 1500) SpO2:  [100 %] 100 % (05/24 0504)    11/27/2022    3:00 PM 11/27/2022   12:23 PM 11/27/2022    5:04 AM  Vitals with BMI  Systolic 143 141 161  Diastolic 87 90 90  Pulse  88 90    Physical Exam:  General: alert, cooperative, and no distress Lochia: appropriate Uterine Fundus: firm Incision: With honey comb dressing, two small dry blood patches, otherwise clean, dry and intact.  DVT Evaluation: No evidence of DVT seen on physical exam. Calf/Ankle edema is present.  Assessment/Plan: 37 y/o Para 6 POD # 4 s/p repeat Cesarean section and left salpingectomy - With elevated blood pressures and a history of chronic HTN, on Procardia XL for BP control, patient received 30 mg then another 30 mg this AM and plan to continue 60 mg PO Q AM tomorrow AM. However if BP continues to be high will give another 30 mg this PM. Then plan for 90 mg PO Q AM. Plan for discharge 11/28/22.  - Lovenox restarted for history of Pulmonary embolus and now postpartum. - Iron tabs for anemia. - Routine post-op care. - Baby continues in NICU and under Bilirubin lights, stable.   Prescilla Sours, MD 11/27/2022, 5:00 PM

## 2022-11-27 NOTE — Discharge Summary (Signed)
Postpartum Discharge Summary  Date of Service updated***     Patient Name: Nicole Landry DOB: 01/04/1986 MRN: [id number]  Date of admission: 11/23/2022 Delivery date:11/23/2022  Delivering provider: Hoover Browns  Date of discharge: 11/28/2022.  Admitting diagnosis:  Status post repeat low transverse cesarean section [Z98.891] 38 week 1 day EGA IUP. History of 4 prior cesarean section and is for a repeat cesarean delivery. Multiparous patient desiring permanent sterilization.  History of ectopic pregnancy and s/p right salpingectomy.  Possible placenta accreta seen on ultrasound.  Chronic hypertension.  History of pulmonary embolus.  Intrauterine pregnancy: [redacted]w[redacted]d     Secondary diagnosis:  Active Problems:   Status post repeat low transverse cesarean section   S/P tubal ligation   Chronic hypertension S/p Selective catheterization of the bilateral internal iliac arteries by IR.    Discharge diagnosis: Preterm Pregnancy Delivered, CHTN, and Anemia                                              Post partum procedures: None Augmentation: N/A Complications: None  Hospital course: Scheduled C/S   37 y.o. yo [id number] at [redacted]w[redacted]d was admitted to the hospital 11/23/2022 for scheduled cesarean section with the following indication: 4 prior cesarean sections and is for a repeat cesarean section, with possible placenta accreta, desires permanent sterilization .Delivery details are as follows:  Membrane Rupture Time/Date: 4:25 PM ,11/23/2022   Delivery Method:C-Section, Low Transverse  Details of operation can be found in separate operative note, delivery was not complicated and there was no placenta accreta, EBL was 614 cc.  Patient had a postpartum course that was not complicated.  She is ambulating, tolerating a regular diet, passing flatus, and urinating well. Patient is discharged home in stable condition on  11/28/2022.  Her baby was admitted in the NICU due to elevated bilirubin.          Newborn Data: Birth date:11/23/2022  Birth time:4:26 PM  Gender:Female  Living status:Living  Apgars:9 ,9  Weight:3080 g     BMZ received: Yes Transfusion:Yes: 110 cc from cell saver used at C/section.   Physical exam  Vitals:   11/26/22 2000 11/27/22 0504 11/27/22 1223 11/27/22 1500  BP: (!) 144/83 (!) 150/90 (!) 141/90 (!) 143/87  Pulse: 99 90 88   Resp: 18 17 17    Temp: 98.4 F (36.9 C) 98.1 F (36.7 C) 98 F (36.7 C)   TempSrc: Oral Oral Oral   SpO2: 100% 100%    Weight:      Height:       General: alert, cooperative, and no distress Lochia: appropriate Uterine Fundus: firm Incision: Incision: With honey comb dressing, two small dry blood patches, otherwise clean, dry and intact.  DVT Evaluation: No evidence of DVT seen on physical exam. Calf/Ankle edema is present Labs: Lab Results  Component Value Date   WBC 12.0 (H) 11/24/2022   HGB 9.0 (L) 11/24/2022   HCT 28.4 (L) 11/24/2022   MCV 88.5 11/24/2022   PLT 192 11/24/2022      Latest Ref Rng & Units 11/24/2022    5:53 AM  CMP  Glucose 70 - 99 mg/dL 81   BUN 6 - 20 mg/dL 5   Creatinine 6.29 - 5.28 mg/dL 4.13   Sodium 244 - 010 mmol/L 130   Potassium 3.5 - 5.1 mmol/L 3.5   Chloride  98 - 111 mmol/L 102   CO2 22 - 32 mmol/L 21   Calcium 8.9 - 10.3 mg/dL 7.9   Total Protein 6.5 - 8.1 g/dL 5.2   Total Bilirubin 0.3 - 1.2 mg/dL 0.6   Alkaline Phos 38 - 126 U/L 55   AST 15 - 41 U/L 19   ALT 0 - 44 U/L 12    Edinburgh Score:    11/24/2022    3:26 PM  Edinburgh Postnatal Depression Scale Screening Tool  I have been able to laugh and see the funny side of things. 1  I have looked forward with enjoyment to things. 0  I have blamed myself unnecessarily when things went wrong. 2  I have been anxious or worried for no good reason. 0  I have felt scared or panicky for no good reason. 0  Things have been getting on top of me. 1  I have been so unhappy that I have had difficulty sleeping. 1  I have felt sad or  miserable. 1  I have been so unhappy that I have been crying. 1  The thought of harming myself has occurred to me. 0  Edinburgh Postnatal Depression Scale Total 7   After visit meds:  Allergies as of 11/27/2022       Reactions   Food Anaphylaxis, Other (See Comments)   Pt is allergic to Cantalope   Povidone Iodine    Preoperative nasal antiseptic   Vicodin [hydrocodone-acetaminophen] Other (See Comments)   Reaction:  Seizures; has tolerated acetaminophen and other narcotics in the past        Medication List     STOP taking these medications    B-12 Compliance Injection 1000 MCG/ML Kit Generic drug: Cyanocobalamin   calcium carbonate 500 MG chewable tablet Commonly known as: TUMS - dosed in mg elemental calcium       TAKE these medications    enoxaparin 100 MG/ML injection Commonly known as: LOVENOX Inject 1 mL (100 mg total) into the skin daily for 40 doses. Start taking on: Nov 28, 2022 What changed:  medication strength how much to take when to take this   ibuprofen 600 MG tablet Commonly known as: ADVIL Take 1 tablet (600 mg total) by mouth every 6 (six) hours.   iron polysaccharides 150 MG capsule Commonly known as: NIFEREX Take 1 capsule (150 mg total) by mouth daily for 42 doses. Start taking on: Nov 28, 2022   oxyCODONE 5 MG immediate release tablet Commonly known as: Oxy IR/ROXICODONE Take 1-2 tablets (5-10 mg total) by mouth every 4 (four) hours as needed for moderate pain.   prenatal multivitamin Tabs tablet Take 1 tablet by mouth in the morning.        Discharge home in stable condition Infant Feeding: Breast Infant Disposition:NICU Discharge instruction: per After Visit Summary and Postpartum booklet. Activity: Advance as tolerated. Pelvic rest for 6 weeks.  Diet: low salt diet and iron rich diet Future Appointments:No future appointments. Follow up Visit:  Follow-up Information     Ob/Gyn, [place]. Schedule an appointment  as soon as possible for a visit in 1 week(s).   Specialty: Obstetrics and Gynecology Why: 1 week incision and mood check. 6 weeks for postpartum check. Contact information: [address] [place] [address] [phone]                Delivery mode:  C-Section, Low Transverse  Anticipated Birth Control:  s/p left salpingectomy at time of C/Section.  11/27/2022 Prescilla Sours, MD

## 2022-11-28 ENCOUNTER — Inpatient Hospital Stay (HOSPITAL_COMMUNITY): Payer: Medicaid Other

## 2022-11-28 MED ORDER — NIFEDIPINE ER OSMOTIC RELEASE 90 MG PO TB24: 90.0000 mg | ORAL_TABLET | Freq: Every day | ORAL | 3 refills | Status: AC

## 2022-11-28 NOTE — Plan of Care (Signed)
  Problem: Education: Goal: Knowledge of General Education information will improve Description: Including pain rating scale, medication(s)/side effects and non-pharmacologic comfort measures Outcome: Completed/Met   Problem: Health Behavior/Discharge Planning: Goal: Ability to manage health-related needs will improve Outcome: Completed/Met   Problem: Clinical Measurements: Goal: Ability to maintain clinical measurements within normal limits will improve Outcome: Completed/Met Goal: Will remain free from infection Outcome: Completed/Met Goal: Diagnostic test results will improve Outcome: Completed/Met   Problem: Activity: Goal: Risk for activity intolerance will decrease Outcome: Completed/Met   Problem: Nutrition: Goal: Adequate nutrition will be maintained Outcome: Completed/Met   Problem: Coping: Goal: Level of anxiety will decrease Outcome: Completed/Met   Problem: Elimination: Goal: Will not experience complications related to bowel motility Outcome: Completed/Met Goal: Will not experience complications related to urinary retention Outcome: Completed/Met   Problem: Pain Managment: Goal: General experience of comfort will improve Outcome: Completed/Met   Problem: Safety: Goal: Ability to remain free from injury will improve Outcome: Completed/Met   Problem: Skin Integrity: Goal: Risk for impaired skin integrity will decrease Outcome: Completed/Met   Problem: Education: Goal: Knowledge of condition will improve Outcome: Completed/Met Goal: Individualized Educational Video(s) Outcome: Completed/Met Goal: Individualized Newborn Educational Video(s) Outcome: Completed/Met   Problem: Activity: Goal: Will verbalize the importance of balancing activity with adequate rest periods Outcome: Completed/Met Goal: Ability to tolerate increased activity will improve Outcome: Completed/Met   Problem: Coping: Goal: Ability to identify and utilize available resources  and services will improve Outcome: Completed/Met   Problem: Life Cycle: Goal: Chance of risk for complications during the postpartum period will decrease Outcome: Completed/Met   Problem: Role Relationship: Goal: Ability to demonstrate positive interaction with newborn will improve Outcome: Completed/Met   Problem: Skin Integrity: Goal: Demonstration of wound healing without infection will improve Outcome: Completed/Met

## 2022-11-28 NOTE — Progress Notes (Signed)
Postpartum Note Day #5  S:  Patient doing well.  Pain controlled.  Tolerating regular diet.   Ambulating and voiding without difficulty. Reports low back pain and bilateral hip pain radiating down to the legs. No issues with ambulation, no numbness/tingling. Denies headache. She has been at the baby's bedside in NICU and nurses while standing. Reports feeling like she had some muscle spasms. She would like anesthesia to evaluate the site of her spinal. She is concerned about this area.  Denies fevers, chills, chest pain, SOB, N/V, or worsening bilateral LE edema.  Lochia: Minimal Infant feeding:  Breast Circumcision:  Declines Contraception:  S/p left salpingectomy (s/p previous right salpingectomy after ectopic pregnancy)  O: Temp:  [98 F (36.7 C)-98.9 F (37.2 C)] 98 F (36.7 C) (05/25 0945) Pulse Rate:  [88-103] 96 (05/25 0945) Resp:  [17-20] 20 (05/25 0945) BP: (135-147)/(75-90) 135/84 (05/25 0945) SpO2:  [99 %-100 %] 99 % (05/25 0945) Gen: NAD, pleasant and cooperative CV: RRR Resp: CTAB, no wheezes/rales/rhonchi Abdomen: soft, non-distended, non-tender throughout Uterus: firm, non-tender, below umbilicus Incision: c/d/i, bandage in place  Ext: Trace bilateral LE edema, no bilateral calf tenderness MSK: No tenderness over spine, no paraspinal tenderness, no erythema/drainage visible  Labs:     Latest Ref Rng & Units 11/24/2022    5:53 AM 11/23/2022    1:45 PM 11/20/2022   10:05 AM  CBC  WBC 4.0 - 10.5 K/uL 12.0  7.4  6.0   Hemoglobin 12.0 - 15.0 g/dL 9.0  78.2  95.6   Hematocrit 36.0 - 46.0 % 28.4  34.9  37.4   Platelets 150 - 400 K/uL 192  229  245     A/P: Patient is a 37 y.o. [id number] POD#5 s/p repeat LTCS and left salpingectomy with selective catheterization of the bilateral internal iliac arteries prior to C/S for hemorrhagic control due to concern for placenta lacunae/possible placenta accreta.  S/p LTCS/left salpingectomy  - Pain well controlled  - GU: UOP is  adequate - GI: Tolerating regular diet - Activity: encouraged sitting up to chair and ambulation as tolerated - DVT Prophylaxis: Ambulation, Lovenox for PE prophylaxis - Labs: stable as above  Low back pain - S/p epidural/spinal combination prior to C/S - Exam overall unremarkable without any neurologic symptoms/findings - Evaluated by anesthesiology (Dr. Renold Don) who recommends imaging to rule out hematoma - Spoke with Radiology (Dr. Bradly Chris) who recommends CT Lumbar spine without contrast  Chronic HTN - Medication: Procardia XL 90mg  daily - Blood pressures now normotensive, (last two 139/75 and 165/84) previously mild range (140-150s/80-90s) - Preeclampsia labs unremarkable, PCR 0.19 - Will need BP check outpatient within the week  History of pulmonary embolism - Occurred after last pregnancy - Lovenox 100mg  daily for prophylaxis - will D/C home with Lovenox as well  Iron deficiency anemia with acute blood loss anemia - S/p iron infusion this pregnancy - Continue oral iron   Disposition:  D/C home today pending imaging.  Steva Ready, DO

## 2022-11-28 NOTE — Progress Notes (Signed)
Anesthesia eval: Called to bedside due to back and hip pain. Patient attributes it to neuraxial procedure in OR. Pt sitting upright comfortably in recliner nursing her baby. She is able to ambulate without issue. She states she is frequently bending over the crib to nurse. Some tenderness at the epidural insertion site. I discussed the low likelihood that this was anything serious, but that it may require further workup.   Her back/hip pain/pressure is concerning to me, especially since she states her back has bothered her since resolution of her spinal on 5/20. Given her iliac access, CSE, lovenox, etc. it could be any number of things. Further workup rule out epidural or retroperitoneal bleed might be considered.

## 2022-11-28 NOTE — Progress Notes (Signed)
Patient discharged, discharge instructions reviewed and patient verbalized understanding. Vital signs WDL, patient stable. Will be rooming in with baby who is currently still a patient in the NICU.

## 2022-11-28 NOTE — Progress Notes (Signed)
In to review imaging findings with patient as documented below. Small non-obstructing renal calculi visualized on the right. No hydronephrosis. Normal-appearing uterus consistent with postpartum state. No epidural hematoma or retroperitoneal hematoma.  Patient reports still feeling some difficulty with sensation in the LLE but is able to ambulate without issue. Mild bilateral LE edema is noted. Non-tender to palpation bilaterally. Reports heat back helps her back better than the muscle relaxant (Flexeril) but only helps for short periods of time. I counseled the patient that these should hopefully resolve over time given the physical stress of surgery (particularly her 5th c-section), stress of infant in the NICU, and sleeping in the bedside chair. Encouraged patient to reach out to the nurses with any concerns. Patient's husband will be obtaining her medications. Blood pressures have been normotensive, eRx for Procardia XL 90mg  daily has been sent to her pharmacy. All questions invited and answered. Will place discharge order.   CT Lumbar Spine: Narrative & Impression  CLINICAL DATA:  Lower back pain. Postop day #5 status post C-section with spinal and lower back pain. On anticoagulation. Evaluate for hematoma.   EXAM: CT LUMBAR SPINE WITHOUT CONTRAST   TECHNIQUE: Multidetector CT imaging of the lumbar spine was performed without intravenous contrast administration. Multiplanar CT image reconstructions were also generated.   RADIATION DOSE REDUCTION: This exam was performed according to the departmental dose-optimization program which includes automated exposure control, adjustment of the mA and/or kV according to patient size and/or use of iterative reconstruction technique.   COMPARISON:  Lumbar spine radiographs 06/19/2008   FINDINGS: Segmentation: There are 5 non-rib-bearing lumbar-type vertebral bodies.   Alignment: Normal frontal and sagittal alignment.   Vertebrae: Vertebral  body heights are maintained. Disc spaces are preserved. No acute fracture is seen.   Paraspinal and other soft tissues: No central canal hyperdensity is seen to indicate an epidural hematoma. No hematoma is seen within the visualized retroperitoneum. The partially visualized uterus appears enlarged and heterogeneous consistent with recent C-section and the current postpartum state. There a is a 5 x 3 mm right lower pole (axial series image 43 and sagittal series 11, image 5) nonobstructing renal stone. Multiple more inferior right lower pole renal stones measuring up to 4 mm. No definitive hydronephrosis is seen, however the kidneys are only partially visualized and incompletely examined.   Disc levels:   L3-4: Mild bilateral facet joint hypertrophy. No posterior disc bulge, central canal narrowing, or neuroforaminal stenosis.   L4-5: Mild bilateral facet joint hypertrophy. No posterior disc bulge, central canal narrowing, or neuroforaminal stenosis.   L5-S1: Mild bilateral facet joint hypertrophy. Mild posterior disc bulge. No significant central canal stenosis. Mild left-greater-than-right neuroforaminal narrowing.   There is bilateral sacroiliac joints degenerative vacuum phenomenon.   IMPRESSION: 1. No acute fracture is seen. 2. No central canal hyperdensity is seen to indicate an epidural hematoma. Outside of the expected enlarged post gravid uterus, no definite hematoma is seen within the visualized retroperitoneum. 3. Mild bilateral L5-S1 neuroforaminal narrowing. 4. Nonobstructing right lower pole renal stones measuring up to 5 x 3 mm. No definitive hydronephrosis is seen, however the kidneys are only partially visualized and incompletely examined.     Electronically Signed   By: Neita Garnet M.D.   On: 11/28/2022 17:22   Steva Ready, DO

## 2022-12-01 ENCOUNTER — Telehealth (HOSPITAL_COMMUNITY): Payer: Self-pay | Admitting: *Deleted

## 2022-12-01 NOTE — Telephone Encounter (Signed)
Hospital Discharge Follow-Up Call:  Patient reports that she is doing OK.  She has been staying with her baby in NICU continuously since her discharge and has not been able to get her medication for pain or for blood pressure management.  She is experiencing pain and is having swelling in lower legs and feet.  Encouraged her to have someone bring her the prescriptions as soon as possible.  Her husband is at their home in [place], managing care for their other children, and working.  It has been a challenging time for the family as their normal routines have been disrupted with baby in the NICU.  Patient has never been separated from her other children for this length of time; she misses them and says they are having a difficult time with her absence.  EPDS today was 13 and patient verbalizes that the higher score is situational and she expects to feel better emotionally once baby is discharged and she can get back home with her family.

## 2022-12-02 ENCOUNTER — Ambulatory Visit: Payer: Medicaid Other

## 2022-12-07 ENCOUNTER — Encounter: Payer: Self-pay | Admitting: Adult Health

## 2023-01-13 ENCOUNTER — Encounter (HOSPITAL_BASED_OUTPATIENT_CLINIC_OR_DEPARTMENT_OTHER): Payer: Self-pay | Admitting: Anesthesiology

## 2023-01-13 ENCOUNTER — Telehealth (HOSPITAL_BASED_OUTPATIENT_CLINIC_OR_DEPARTMENT_OTHER): Payer: Self-pay | Admitting: Anesthesiology

## 2023-01-13 NOTE — Progress Notes (Signed)
DOS 11/23/2022 S/p C-section under Combined Spinal/Epidural CC: back pain  I spoke with Nicole Landry today for a complaint of back pain that has continued since her surgery in May.  I reviewed the procedure note of the combined spinal/epidural which was placed without incident (no paresthesias at time of insertion).  My partner Dr Renold Don evaluated her for the same complaint on 11/28/22 at which time a CT lumbar spine was suggested to rule out epidural or retroperitoneal bleeding due to complex medical history and anticoagulant use.  CT scan showed no acute concerns for epidural/retroperitoneal bleeding.  Patient was discharged home, but has unfortunately continued to have back pain that is only partially relieved with use of a heating pad.  Patient states that she has tried narcotics, muscle relaxants, and NSAIDs with no relief of the back pain.  She cannot identify anything that makes the pain worse or better.  The pain is described as diffuse, muscular pain in her low back, tender to palpation, and constant in nature.  She denies any paresthesias or pain in her lower extremities.  She has discontinued any medications for pain control due to their ineffectiveness.  She does relate that she missed her 6 week post-partum appointment, but does have that appointment rescheduled for next week.  With the patient describing what sounds like musculoskeletal pain, I have a low suspicion of a neurologic cause for the back pain.    I emphasized with Nicole Landry to keep her post-partum appointment with her OB-GYN and to discuss further steps/workup of the back pain after a physical exam by her physician.  She does not have a PCP at this time.  We discussed possible alternative NSAIDs (she has only tried ibuprofen) to try and also physical therapy if her OB-GYN feels it is necessary.    All of Nicole Landry's questions were answered.  Arrie Aran, MD Anesthesiology

## 2023-01-28 ENCOUNTER — Encounter: Payer: Self-pay | Admitting: Adult Health
